# Patient Record
Sex: Female | Born: 1975 | Race: Black or African American | Hispanic: No | Marital: Married | Smoking: Current every day smoker
Health system: Southern US, Community
[De-identification: ages and names within clinical notes are randomized; demographics above are authoritative.]

## PROBLEM LIST (undated history)

## (undated) DIAGNOSIS — D649 Anemia, unspecified: Secondary | ICD-10-CM

## (undated) DIAGNOSIS — O139 Gestational [pregnancy-induced] hypertension without significant proteinuria, unspecified trimester: Secondary | ICD-10-CM

## (undated) DIAGNOSIS — F32A Depression, unspecified: Secondary | ICD-10-CM

## (undated) DIAGNOSIS — E119 Type 2 diabetes mellitus without complications: Secondary | ICD-10-CM

## (undated) HISTORY — PX: TONSILLECTOMY: SUR1361

---

## 2018-02-21 DIAGNOSIS — D508 Other iron deficiency anemias: Secondary | ICD-10-CM

## 2018-02-21 DIAGNOSIS — N920 Excessive and frequent menstruation with regular cycle: Secondary | ICD-10-CM

## 2018-11-05 ENCOUNTER — Inpatient Hospital Stay (HOSPITAL_COMMUNITY)
Admission: EM | Admit: 2018-11-05 | Discharge: 2018-11-10 | DRG: 254 | Disposition: A | Discharge status: Home / Self Care | Payer: BLUE CROSS/BLUE SHIELD | Attending: Vascular Surgery | Admitting: Vascular Surgery

## 2018-11-05 ENCOUNTER — Other Ambulatory Visit: Payer: Self-pay

## 2018-11-05 DIAGNOSIS — F17210 Nicotine dependence, cigarettes, uncomplicated: Secondary | ICD-10-CM | POA: Diagnosis not present

## 2018-11-05 DIAGNOSIS — I70292 Other atherosclerosis of native arteries of extremities, left leg: Secondary | ICD-10-CM | POA: Diagnosis not present

## 2018-11-05 DIAGNOSIS — I70202 Unspecified atherosclerosis of native arteries of extremities, left leg: Secondary | ICD-10-CM | POA: Diagnosis present

## 2018-11-05 NOTE — ED Provider Notes (Signed)
[facility] EMERGENCY DEPARTMENT Provider Note   CSN: [id number] Arrival date & time: 11/05/18  2248    History   Chief Complaint No chief complaint on file.   HPI Ronita Behner is a 43 y.o. female.     HPI  This is a 43 year old female every day smoker who presents as a transfer from an outside hospital with concern for left popliteal artery occlusion.  Patient presented to outside hospital with complaints of worsening left knee pain that migrated to her left lower leg.  Time she reports numbness.  She was found to have difficult to assess pedal pulses.  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  She was started on heparin.  She was given pain medication prior to transfer and currently denies any pain.  No history of atrial fibrillation or embolic events in the past.  She was transferred here for vascular surgery evaluation.  No past medical history on file.  There are no active problems to display for this patient.  No significant past medical history.  OB History   No obstetric history on file.      Home Medications    Prior to Admission medications   Not on File    Family History No family history on file.  Social History Social History   Tobacco Use  . Smoking status: Not on file  Substance Use Topics  . Alcohol use: Not on file  . Drug use: Not on file     Allergies   Patient has no known allergies.   Review of Systems Review of Systems  Constitutional: Negative for fever.  Respiratory: Negative for shortness of breath.   Cardiovascular: Negative for chest pain and leg swelling.  Musculoskeletal:       Left leg pain  Skin: Negative for color change.  Neurological: Positive for numbness. Negative for weakness.  All other systems reviewed and are negative.    Physical Exam Updated Vital Signs BP (!) 151/84 (BP Location: Right Arm)   Pulse 71   Temp 98.4 F (36.9 C)    Resp 14   Ht 1.549 m (5\' 1" )   Wt 81.6 kg   LMP 10/10/2018 (Approximate)   SpO2 100%   BMI 34.01 kg/m   Physical Exam Vitals signs and nursing note reviewed.  Constitutional:      Appearance: She is well-developed. She is not ill-appearing or toxic-appearing.  HENT:     Head: Normocephalic and atraumatic.  Neck:     Musculoskeletal: Neck supple.  Cardiovascular:     Rate and Rhythm: Normal rate and regular rhythm.     Comments: 1+ DP pulse right foot, faint pulse DP pulse left foot Pulmonary:     Effort: Pulmonary effort is normal. No respiratory distress.  Musculoskeletal:        General: No swelling or tenderness.  Skin:    General: Skin is warm and dry.     Comments: No color change, left foot slightly cooler than right  Neurological:     Mental Status: She is alert and oriented to person, place, and time.      ED Treatments / Results  Labs (all labs ordered are listed, but only abnormal results are displayed) Labs Reviewed - No data to display  EKG None  Radiology No results found.  Procedures Procedures (including critical care time)  Medications Ordered in ED Medications - No data to display   Initial Impression /  Assessment and Plan / ED Course  I have reviewed the triage vital signs and the nursing notes.  Pertinent labs & imaging results that were available during my care of the patient were reviewed by me and considered in my medical decision making (see chart for details).        Patient presents as a transfer from an outside hospital with a CT confirmed popliteal artery occlusion.  She is overall nontoxic and her vital signs are stable.  She is pain-free.  Her exam is very subtle with the exception of decreased pulse in the left foot and slightly cooler.  Discussed with Dr. Clark.  He will assess the patient.  Final Clinical Impressions(s) / ED DiaChestine Sporegnoses   Final diagnoses:  Popliteal artery occlusion, left [facility](HCC)    ED Discharge Orders     None       Wilkie AyeHorton, Mayer Maskerourtney F, MD 11/06/18 [phone]

## 2018-11-05 NOTE — ED Triage Notes (Signed)
Pt here from [facility] via [place] for L popliteal artery occlusion. Pedal pulses present in both feet, L weaker than right. Pt arrives with Heparin running 1000 U/hr.

## 2018-11-06 ENCOUNTER — Encounter (HOSPITAL_COMMUNITY)
Admission: EM | Disposition: A | Discharge status: Home / Self Care | Payer: Self-pay | Source: Home / Self Care | Attending: Vascular Surgery

## 2018-11-06 ENCOUNTER — Encounter (HOSPITAL_COMMUNITY): Payer: Self-pay

## 2018-11-06 DIAGNOSIS — I70292 Other atherosclerosis of native arteries of extremities, left leg: Secondary | ICD-10-CM | POA: Diagnosis present

## 2018-11-06 DIAGNOSIS — I70202 Unspecified atherosclerosis of native arteries of extremities, left leg: Secondary | ICD-10-CM | POA: Diagnosis present

## 2018-11-06 DIAGNOSIS — I361 Nonrheumatic tricuspid (valve) insufficiency: Secondary | ICD-10-CM | POA: Diagnosis not present

## 2018-11-06 DIAGNOSIS — F17210 Nicotine dependence, cigarettes, uncomplicated: Secondary | ICD-10-CM | POA: Diagnosis present

## 2018-11-06 DIAGNOSIS — I743 Embolism and thrombosis of arteries of the lower extremities: Secondary | ICD-10-CM | POA: Diagnosis not present

## 2018-11-06 DIAGNOSIS — I739 Peripheral vascular disease, unspecified: Secondary | ICD-10-CM | POA: Diagnosis not present

## 2018-11-06 HISTORY — PX: LOWER EXTREMITY ANGIOGRAPHY: CATH118251

## 2018-11-06 LAB — CBC
HCT: 31.7 % — ABNORMAL LOW (ref 36.0–46.0)
Hemoglobin: 9.3 g/dL — ABNORMAL LOW (ref 12.0–15.0)
MCH: 23.7 pg — ABNORMAL LOW (ref 26.0–34.0)
MCHC: 29.3 g/dL — ABNORMAL LOW (ref 30.0–36.0)
MCV: 80.9 fL (ref 80.0–100.0)
Platelets: 261 10*3/uL (ref 150–400)
RBC: 3.92 MIL/uL (ref 3.87–5.11)
RDW: 16.1 % — ABNORMAL HIGH (ref 11.5–15.5)
WBC: 6.8 10*3/uL (ref 4.0–10.5)
nRBC: 0 % (ref 0.0–0.2)

## 2018-11-06 LAB — COMPREHENSIVE METABOLIC PANEL
ALT: 24 U/L (ref 0–44)
AST: 23 U/L (ref 15–41)
Albumin: 3.7 g/dL (ref 3.5–5.0)
Alkaline Phosphatase: 87 U/L (ref 38–126)
Anion gap: 8 (ref 5–15)
BUN: 7 mg/dL (ref 6–20)
CO2: 24 mmol/L (ref 22–32)
Calcium: 8.8 mg/dL — ABNORMAL LOW (ref 8.9–10.3)
Chloride: 107 mmol/L (ref 98–111)
Creatinine, Ser: 0.81 mg/dL (ref 0.44–1.00)
GFR calc Af Amer: >60 mL/min (ref >60)
GFR calc non Af Amer: >60 mL/min (ref >60)
Glucose, Bld: 91 mg/dL (ref 70–99)
Potassium: 3.9 mmol/L (ref 3.5–5.1)
Sodium: 139 mmol/L (ref 135–145)
Total Bilirubin: 0.4 mg/dL (ref 0.3–1.2)
Total Protein: 7 g/dL (ref 6.5–8.1)

## 2018-11-06 LAB — PROTIME-INR
INR: 1 (ref 0.8–1.2)
Prothrombin Time: 13.5 seconds (ref 11.4–15.2)

## 2018-11-06 LAB — HEPARIN LEVEL (UNFRACTIONATED)
Heparin Unfractionated: 0.34 IU/mL (ref 0.30–0.70)
Heparin Unfractionated: 0.34 IU/mL (ref 0.30–0.70)

## 2018-11-06 SURGERY — LOWER EXTREMITY ANGIOGRAPHY
Anesthesia: LOCAL | Laterality: Bilateral

## 2018-11-06 MED ORDER — HEPARIN (PORCINE) IN NACL 1000-0.9 UT/500ML-% IV SOLN
INTRAVENOUS | Status: DC | PRN
  Administered 2018-11-06: 500 mL

## 2018-11-06 MED ORDER — CEFAZOLIN SODIUM-DEXTROSE 2-4 GM/100ML-% IV SOLN
2.0000 g | Freq: Once | INTRAVENOUS | Status: AC
  Administered 2018-11-07: 2 g via INTRAVENOUS

## 2018-11-06 MED ORDER — HEPARIN (PORCINE) 25000 UT/250ML-% IV SOLN
1000.0000 [IU]/h | INTRAVENOUS | Status: DC
  Administered 2018-11-06: 1000 [IU]/h via INTRAVENOUS
  Filled 2018-11-06: qty 250

## 2018-11-06 MED ORDER — DIPHENHYDRAMINE HCL 25 MG PO CAPS
50.0000 mg | ORAL_CAPSULE | Freq: Four times a day (QID) | ORAL | Status: DC | PRN
  Administered 2018-11-06 – 2018-11-08 (×3): 50 mg via ORAL
  Filled 2018-11-06 (×3): qty 2

## 2018-11-06 MED ORDER — HYDRALAZINE HCL 20 MG/ML IJ SOLN: 5.0000 mg | INTRAMUSCULAR | Status: DC | PRN

## 2018-11-06 MED ORDER — LIDOCAINE HCL (PF) 1 % IJ SOLN
INTRAMUSCULAR | Status: DC | PRN
  Administered 2018-11-06: 15 mL

## 2018-11-06 MED ORDER — FENTANYL CITRATE (PF) 100 MCG/2ML IJ SOLN
INTRAMUSCULAR | Status: AC
  Filled 2018-11-06: qty 2

## 2018-11-06 MED ORDER — MIDAZOLAM HCL 2 MG/2ML IJ SOLN
INTRAMUSCULAR | Status: AC
  Filled 2018-11-06: qty 2

## 2018-11-06 MED ORDER — MIDAZOLAM HCL 2 MG/2ML IJ SOLN
INTRAMUSCULAR | Status: DC | PRN
  Administered 2018-11-06: 1 mg via INTRAVENOUS

## 2018-11-06 MED ORDER — SODIUM CHLORIDE 0.9% FLUSH
3.0000 mL | Freq: Two times a day (BID) | INTRAVENOUS | Status: DC
  Administered 2018-11-07 – 2018-11-10 (×7): 3 mL via INTRAVENOUS

## 2018-11-06 MED ORDER — HEPARIN (PORCINE) IN NACL 1000-0.9 UT/500ML-% IV SOLN
INTRAVENOUS | Status: AC
  Filled 2018-11-06: qty 1000

## 2018-11-06 MED ORDER — MORPHINE SULFATE (PF) 2 MG/ML IV SOLN
2.0000 mg | INTRAVENOUS | Status: DC | PRN
  Administered 2018-11-07 (×3): 2 mg via INTRAVENOUS
  Filled 2018-11-06 (×3): qty 1

## 2018-11-06 MED ORDER — GUAIFENESIN-DM 100-10 MG/5ML PO SYRP: 15.0000 mL | ORAL_SOLUTION | ORAL | Status: DC | PRN

## 2018-11-06 MED ORDER — HEPARIN (PORCINE) 25000 UT/250ML-% IV SOLN: 1000.0000 [IU]/h | INTRAVENOUS | Status: DC

## 2018-11-06 MED ORDER — ONDANSETRON HCL 4 MG/2ML IJ SOLN: 4.0000 mg | Freq: Four times a day (QID) | INTRAMUSCULAR | Status: DC | PRN

## 2018-11-06 MED ORDER — SODIUM CHLORIDE 0.9 % WEIGHT BASED INFUSION
1.0000 mL/kg/h | INTRAVENOUS | Status: AC
  Administered 2018-11-06: 1 mL/kg/h via INTRAVENOUS

## 2018-11-06 MED ORDER — TRAMADOL HCL 50 MG PO TABS
50.0000 mg | ORAL_TABLET | Freq: Four times a day (QID) | ORAL | Status: DC | PRN
  Administered 2018-11-07 – 2018-11-09 (×5): 50 mg via ORAL
  Filled 2018-11-06 (×5): qty 1

## 2018-11-06 MED ORDER — ALUM & MAG HYDROXIDE-SIMETH 200-200-20 MG/5ML PO SUSP: 15.0000 mL | ORAL | Status: DC | PRN

## 2018-11-06 MED ORDER — METOPROLOL TARTRATE 5 MG/5ML IV SOLN: 2.0000 mg | INTRAVENOUS | Status: DC | PRN

## 2018-11-06 MED ORDER — POTASSIUM CHLORIDE CRYS ER 20 MEQ PO TBCR: 20.0000 meq | EXTENDED_RELEASE_TABLET | Freq: Once | ORAL | Status: DC

## 2018-11-06 MED ORDER — SODIUM CHLORIDE 0.9% FLUSH: 3.0000 mL | INTRAVENOUS | Status: DC | PRN

## 2018-11-06 MED ORDER — FENTANYL CITRATE (PF) 100 MCG/2ML IJ SOLN
INTRAMUSCULAR | Status: DC | PRN
  Administered 2018-11-06: 50 ug via INTRAVENOUS

## 2018-11-06 MED ORDER — SODIUM CHLORIDE 0.9 % IV SOLN: INTRAVENOUS | Status: DC

## 2018-11-06 MED ORDER — CEFAZOLIN SODIUM-DEXTROSE 1-4 GM/50ML-% IV SOLN: 1.0000 g | INTRAVENOUS | Status: DC

## 2018-11-06 MED ORDER — ACETAMINOPHEN 325 MG PO TABS
650.0000 mg | ORAL_TABLET | ORAL | Status: DC | PRN
  Administered 2018-11-07 – 2018-11-08 (×2): 650 mg via ORAL
  Filled 2018-11-06 (×2): qty 2

## 2018-11-06 MED ORDER — SODIUM CHLORIDE 0.9 % IV SOLN: 250.0000 mL | INTRAVENOUS | Status: DC | PRN

## 2018-11-06 MED ORDER — PHENOL 1.4 % MT LIQD: 1.0000 | OROMUCOSAL | Status: DC | PRN

## 2018-11-06 MED ORDER — LIDOCAINE HCL (PF) 1 % IJ SOLN
INTRAMUSCULAR | Status: AC
  Filled 2018-11-06: qty 30

## 2018-11-06 MED ORDER — LABETALOL HCL 5 MG/ML IV SOLN
10.0000 mg | INTRAVENOUS | Status: DC | PRN
  Administered 2018-11-07 (×2): 10 mg via INTRAVENOUS

## 2018-11-06 MED ORDER — LABETALOL HCL 5 MG/ML IV SOLN: 10.0000 mg | INTRAVENOUS | Status: DC | PRN

## 2018-11-06 MED ORDER — MORPHINE SULFATE (PF) 10 MG/ML IV SOLN
INTRAVENOUS | Status: AC
  Filled 2018-11-06: qty 1

## 2018-11-06 MED ORDER — MORPHINE SULFATE (PF) 10 MG/ML IV SOLN
INTRAVENOUS | Status: DC | PRN
  Administered 2018-11-06: 5 mg via INTRAVENOUS

## 2018-11-06 MED ORDER — IODIXANOL 320 MG/ML IV SOLN
INTRAVENOUS | Status: DC | PRN
  Administered 2018-11-06: 13:00:00 126 mL via INTRA_ARTERIAL

## 2018-11-06 MED ORDER — PANTOPRAZOLE SODIUM 40 MG PO TBEC
40.0000 mg | DELAYED_RELEASE_TABLET | Freq: Every day | ORAL | Status: DC
  Administered 2018-11-06 – 2018-11-10 (×5): 40 mg via ORAL
  Filled 2018-11-06 (×5): qty 1

## 2018-11-06 SURGICAL SUPPLY — 11 items
CATH OMNI FLUSH 5F 65CM (CATHETERS) ×2 IMPLANT
CLOSURE MYNX CONTROL 5F (Vascular Products) ×2 IMPLANT
GUIDEWIRE ANGLED .035X150CM (WIRE) ×2 IMPLANT
KIT MICROPUNCTURE NIT STIFF (SHEATH) ×2 IMPLANT
KIT PV (KITS) ×2 IMPLANT
SHEATH PINNACLE 5F 10CM (SHEATH) ×2 IMPLANT
SHEATH PROBE COVER 6X72 (BAG) ×2 IMPLANT
SYR MEDRAD MARK V 150ML (SYRINGE) ×2 IMPLANT
TRANSDUCER W/STOPCOCK (MISCELLANEOUS) ×2 IMPLANT
TRAY PV CATH (CUSTOM PROCEDURE TRAY) ×2 IMPLANT
WIRE BENTSON .035X145CM (WIRE) ×2 IMPLANT

## 2018-11-06 NOTE — Progress Notes (Signed)
ANTICOAGULATION CONSULT NOTE - Follow Up Consult  Pharmacy Consult for Heparin Indication: DVT  Allergies  Allergen Reactions  . Hydrocodone Bitartrate Er Hives    Patient Measurements: Height: 5\' 1"  (154.9 cm) Weight: 177 lb 12.8 oz (80.6 kg) IBW/kg (Calculated) : 47.8 Heparin Dosing Weight: 66kg  Vital Signs: Temp: 97.9 F (36.6 C) (04/14 0800) Temp Source: Oral (04/14 0800) BP: 131/76 (04/14 0800) Pulse Rate: 72 (04/14 0800)  Labs: Recent Labs    11/06/18 0402 11/06/18 0921  HGB 9.3*  --   HCT 31.7*  --   PLT 261  --   LABPROT 13.5  --   INR 1.0  --   HEPARINUNFRC 0.34 0.34  CREATININE 0.81  --     Estimated Creatinine Clearance: 88.9 mL/min (by C-G formula based on SCr of 0.81 mg/dL).   Assessment:  Anticoag: none PTA - DVT: transferred from [facility] w/ heparin IV at 1000 units/hr. HL 0.34 x 2 in goal. Hgb only 9.3. Plts 261   Goal of Therapy:  Heparin level 0.3-0.7 units/ml Monitor platelets by anticoagulation protocol: Yes   Plan:  Continue heparin 1000 units/hr Daily HL and CBC Fu vascular plans  Lahela Woodin S. Merilynn Finland, PharmD, BCPS Clinical Staff Pharmacist Misty Stanley Stillinger 11/06/2018,10:58 AM

## 2018-11-06 NOTE — Progress Notes (Addendum)
ANTICOAGULATION CONSULT NOTE - Initial Consult  Pharmacy Consult for heparin Indication: DVT  No Known Allergies  Patient Measurements: Height: 5\' 1"  (154.9 cm) Weight: 180 lb (81.6 kg) IBW/kg (Calculated) : 47.8 Heparin Dosing Weight: 66.3 kg  Vital Signs: Temp: 98.4 F (36.9 C) (04/13 2253) BP: 151/84 (04/13 2253) Pulse Rate: 71 (04/13 2253)  Labs: No results for input(s): HGB, HCT, PLT, APTT, LABPROT, INR, HEPARINUNFRC, HEPRLOWMOCWT, CREATININE, CKTOTAL, CKMB, TROPONINI in the last 72 hours.  CrCl cannot be calculated (No successful lab value found.).   Medical History: No past medical history on file.  Medications:  Scheduled:  . pantoprazole  40 mg Oral Daily  . potassium chloride  20-40 mEq Oral Once   Infusions:  . sodium chloride      Assessment: 43 yo F admitted with DVT (left popliteal vein occlusion) and was transferred from OSH with IV heparin infusion. Not on any anticoagulants PTA.  Per [facility] records: heparin IV 4000 units x 1 given @ 2127 followed by heparin infusion at 1000 units/hr started @ 2136.    Goal of Therapy:  Heparin level 0.3-0.7 units/ml Monitor platelets by anticoagulation protocol: Yes   Plan:  Continue heparin drip at 1000 units/hr Check heparin level now Continue to monitor CBC and signs/symptoms of bleeding  Danae Orleans, PharmD PGY1 Pharmacy Resident Phone [phone] 11/06/2018       2:02 AM  ------------------------------------------------------------------------------------------------------------------------ Addendum: Heparin level therapeutic at 0.34 on heparin IV 1000 units/hr  Plan:  Continue heparin drip at 1000 units/hr Check confirmatory heparin level in 6 hours Monitor CBC and signs/symptoms of bleeding  Danae Orleans, PharmD PGY1 Pharmacy Resident Phone [phone] 11/06/2018       4:42 AM

## 2018-11-06 NOTE — Consult Note (Signed)
Hospital Consult    Reason for Consult:  Left popliteal artery occlusion Referring Physician:  [facility] Salvia MRN #:  [id number]  History of Present Illness: This is a 43 y.o. female with history of chronic tobacco abuse that presents as a transfer from [place] for evaluation of left popliteal artery occlusion.  Patient was seen in the ED there complaining of left knee pain with pain that radiated down to her calf and although Doppler signals were identified a CTA of her left lower extremity was ordered.  CTA showed a occlusion of her left popliteal artery with occlusion extending into the tibioperoneal trunk and proximal posterior tibial artery. Patient endorses several years of pain in her left knee that comes and goes without warning.  She states at times she has pain in her left calf when she walks that usually alleviates with rest.  She went to the ED tonight due to the severity of the pain, states it felt the same as all the other episodes.  She has been seen in the ED multiple times for left knee pain with her last visit in January of this year and she states her pain today is the same character as the pain she had in January.  She denies any previous history of thromboembolism or other blood clots.  She does smoke.  She is not on blood thinners.  PMH: Tobacco abuse  PSH: Denies  Social hx: Tobacco abuse since age 73  No Known Allergies  Prior to Admission medications   Not on File    No family history on file.  ROS: [x]  Positive   [ ]  Negative   [ ]  All sytems reviewed and are negative  Cardiovascular: []  chest pain/pressure []  palpitations []  SOB lying flat []  DOE [x]  pain in legs while walking (left leg sometimes) []  pain in legs at rest []  pain in legs at night []  non-healing ulcers []  hx of DVT []  swelling in legs  Pulmonary: []  productive cough []  asthma/wheezing []  home O2  Neurologic: []  weakness in []  arms []  legs []  numbness in []  arms []  legs []  hx of CVA  []  mini stroke [] difficulty speaking or slurred speech []  temporary loss of vision in one eye []  dizziness  Hematologic: []  hx of cancer []  bleeding problems []  problems with blood clotting easily  Endocrine:   []  diabetes []  thyroid disease  GI []  vomiting blood []  blood in stool  GU: []  CKD/renal failure []  HD--[]  M/W/F or []  T/T/S []  burning with urination []  blood in urine  Psychiatric: []  anxiety []  depression  Musculoskeletal: []  arthritis []  joint pain  Integumentary: []  rashes []  ulcers  Constitutional: []  fever []  chills   Physical Examination  Vitals:   11/05/18 2253  BP: (!) 151/84  Pulse: 71  Resp: 14  Temp: 98.4 F (36.9 C)  SpO2: 100%   Body mass index is 34.01 kg/m.  General:  WDWN in NAD Gait: Not observed HENT: WNL, normocephalic Pulmonary: normal non-labored breathing, without Rales, rhonchi,  wheezing Cardiac: regular, without  Murmurs, rubs or gallops Abdomen: soft, NT/ND, no masses Skin: without rashes Vascular Exam/Pulses:  Right Left  Radial 2+ (normal) 2+ (normal)  Ulnar    Femoral 2+ (normal) 2+ (normal)  Popliteal 2+ (normal) absent  DP 2+ (normal) Signal  PT 2+ (normal) Signal   Extremities: without ischemic changes, without Gangrene , without cellulitis; without open wounds;  Musculoskeletal: no muscle wasting or atrophy  Neurologic: A&O X 3; Appropriate Affect ; SENSATION:  normal; MOTOR FUNCTION:  moving all extremities equally. Speech is fluent/normal Left foot is motor and sensory intact  CBC No results found for: WBC, RBC, HGB, HCT, PLT, MCV, MCH, MCHC, RDW, LYMPHSABS, MONOABS, EOSABS, BASOSABS  BMET No results found for: NA, K, CL, CO2, GLUCOSE, BUN, CREATININE, CALCIUM, GFRNONAA, GFRAA  COAGS: No results found for: INR, PROTIME   Non-Invasive Vascular Imaging:    I reviewed her CTA of the left leg that shows occlusion of her left popliteal artery with extension of thrombus into the tibioperoneal  trunk trunk and proximal posterior tibial artery.  There is distal reconstitution of all 3 tibial vessels.   ASSESSMENT/PLAN: This is a 42 y.o. female that presents as a transfer from [place] for evaluation of left popliteal occlusion.  Her exam is relatively reassuring and she does not appear overtly ischemic.  On my evaluation, she was having no pain in her left lower extremity, she has dorsalis pedis and posterior tibial signals at the ankle, and she is motor and sensory intact.  She describes several years of left knee pain and discomfort that radiates up and down her leg.  At times she has what sounds like claudication when she walks - pain in the calf that alleviates with rest.  She has been seen in multiple ED's over the last 3 months for evaluation of this left knee pain.  Her history would suggest that this is potentially more of a chronic problem.  Certainly a popliteal cutdown with attempt at thromboembolectomy would be an option, but if chronic the clot may not be amendable to thrombectomy.  She may benefit from arteriogram first in case we cannot retrieve clot and would require a bypass.  We will plan to admit her tonight and keep her on heparin at this time.  We will keep her n.p.o. I discussed all of these options with her in detail.     Rocio Wolak J. Martika Egler, MD [facility] [place] Office: [phone] Pager: [phone] 

## 2018-11-06 NOTE — Progress Notes (Signed)
I discussed details of angio with the patient today/  We discussed proceeding with operative intervention tomorrow.  We talked about thrombectomy vs bypass.  We discussed the details as well as risks of the surgery.  I will go over them with her again tomorrow.  NPO after midnitght  Debra Ball

## 2018-11-06 NOTE — H&P (View-Only) (Signed)
I discussed details of angio with the patient today/  We discussed proceeding with operative intervention tomorrow.  We talked about thrombectomy vs bypass.  We discussed the details as well as risks of the surgery.  I will go over them with her again tomorrow.  NPO after midnitght  Debra Ball 

## 2018-11-06 NOTE — H&P (View-Only) (Signed)
Hospital Consult    Reason for Consult:  Left popliteal artery occlusion Referring Physician:  [facility] Sal[facility] MRN #:  [id number]  History of Present Illness: This is a 43 y.o. female with history of chronic tobacco abuse that presents as a transfer from [place] for evaluation of left popliteal artery occlusion.  Patient was seen in the ED there complaining of left knee pain with pain that radiated down to her calf and although Doppler signals were identified a CTA of her left lower extremity was ordered.  CTA showed a occlusion of her left popliteal artery with occlusion extending into the tibioperoneal trunk and proximal posterior tibial artery. Patient endorses several years of pain in her left knee that comes and goes without warning.  She states at times she has pain in her left calf when she walks that usually alleviates with rest.  She went to the ED tonight due to the severity of the pain, states it felt the same as all the other episodes.  She has been seen in the ED multiple times for left knee pain with her last visit in January of this year and she states her pain today is the same character as the pain she had in January.  She denies any previous history of thromboembolism or other blood clots.  She does smoke.  She is not on blood thinners.  PMH: Tobacco abuse  PSH: Denies  Social hx: Tobacco abuse since age 73  No Known Allergies  Prior to Admission medications   Not on File    No family history on file.  ROS: [x]  Positive   [ ]  Negative   [ ]  All sytems reviewed and are negative  Cardiovascular: []  chest pain/pressure []  palpitations []  SOB lying flat []  DOE [x]  pain in legs while walking (left leg sometimes) []  pain in legs at rest []  pain in legs at night []  non-healing ulcers []  hx of DVT []  swelling in legs  Pulmonary: []  productive cough []  asthma/wheezing []  home O2  Neurologic: []  weakness in []  arms []  legs []  numbness in []  arms []  legs []  hx of CVA  []  mini stroke [] difficulty speaking or slurred speech []  temporary loss of vision in one eye []  dizziness  Hematologic: []  hx of cancer []  bleeding problems []  problems with blood clotting easily  Endocrine:   []  diabetes []  thyroid disease  GI []  vomiting blood []  blood in stool  GU: []  CKD/renal failure []  HD--[]  M/W/F or []  T/T/S []  burning with urination []  blood in urine  Psychiatric: []  anxiety []  depression  Musculoskeletal: []  arthritis []  joint pain  Integumentary: []  rashes []  ulcers  Constitutional: []  fever []  chills   Physical Examination  Vitals:   11/05/18 2253  BP: (!) 151/84  Pulse: 71  Resp: 14  Temp: 98.4 F (36.9 C)  SpO2: 100%   Body mass index is 34.01 kg/m.  General:  WDWN in NAD Gait: Not observed HENT: WNL, normocephalic Pulmonary: normal non-labored breathing, without Rales, rhonchi,  wheezing Cardiac: regular, without  Murmurs, rubs or gallops Abdomen: soft, NT/ND, no masses Skin: without rashes Vascular Exam/Pulses:  Right Left  Radial 2+ (normal) 2+ (normal)  Ulnar    Femoral 2+ (normal) 2+ (normal)  Popliteal 2+ (normal) absent  DP 2+ (normal) Signal  PT 2+ (normal) Signal   Extremities: without ischemic changes, without Gangrene , without cellulitis; without open wounds;  Musculoskeletal: no muscle wasting or atrophy  Neurologic: A&O X 3; Appropriate Affect ; SENSATION:  normal; MOTOR FUNCTION:  moving all extremities equally. Speech is fluent/normal Left foot is motor and sensory intact  CBC No results found for: WBC, RBC, HGB, HCT, PLT, MCV, MCH, MCHC, RDW, LYMPHSABS, MONOABS, EOSABS, BASOSABS  BMET No results found for: NA, K, CL, CO2, GLUCOSE, BUN, CREATININE, CALCIUM, GFRNONAA, GFRAA  COAGS: No results found for: INR, PROTIME   Non-Invasive Vascular Imaging:    I reviewed her CTA of the left leg that shows occlusion of her left popliteal artery with extension of thrombus into the tibioperoneal  trunk trunk and proximal posterior tibial artery.  There is distal reconstitution of all 3 tibial vessels.   ASSESSMENT/PLAN: This is a 43 y.o. female that presents as a transfer from [place] for evaluation of left popliteal occlusion.  Her exam is relatively reassuring and she does not appear overtly ischemic.  On my evaluation, she was having no pain in her left lower extremity, she has dorsalis pedis and posterior tibial signals at the ankle, and she is motor and sensory intact.  She describes several years of left knee pain and discomfort that radiates up and down her leg.  At times she has what sounds like claudication when she walks - pain in the calf that alleviates with rest.  She has been seen in multiple ED's over the last 3 months for evaluation of this left knee pain.  Her history would suggest that this is potentially more of a chronic problem.  Certainly a popliteal cutdown with attempt at thromboembolectomy would be an option, but if chronic the clot may not be amendable to thrombectomy.  She may benefit from arteriogram first in case we cannot retrieve clot and would require a bypass.  We will plan to admit her tonight and keep her on heparin at this time.  We will keep her n.p.o. I discussed all of these options with her in detail.     Cephus Shellinghristopher J. Clark, MD [facility] [place] Office: [phone] Pager: [phone]

## 2018-11-06 NOTE — Interval H&P Note (Signed)
History and Physical Interval Note:  11/06/2018 12:12 PM  Debra Ball  has presented today for surgery, with the diagnosis of pad.  The various methods of treatment have been discussed with the patient and family. After consideration of risks, benefits and other options for treatment, the patient has consented to  Procedure(s): LOWER EXTREMITY ANGIOGRAPHY (N/A) as a surgical intervention.  The patient's history has been reviewed, patient examined, no change in status, stable for surgery.  I have reviewed the patient's chart and labs.  Questions were answered to the patient's satisfaction.     Durene Cal

## 2018-11-06 NOTE — ED Notes (Signed)
Consulting provider bedside

## 2018-11-06 NOTE — Op Note (Signed)
    Patient name: Kerrian Panik MRN: [id number] DOB: [birth year] Sex: female  11/05/2018 - 11/06/2018 Pre-operative Diagnosis: Left popliteal occlusion Post-operative diagnosis:  Same Surgeon:  Durene Cal Procedure Performed:  1.  Ultrasound-guided access, right femoral artery  2.  Abdominal aortogram  3.  Bilateral lower extremity runoff  4.  Second-order catheterization  5.  Conscious sedation (15 minutes)  6.  Closure device (Mynx)     Indications: Patient has had left leg pain for several months.  CT scan shows popliteal occlusion.  She is here today for further evaluation  Procedure:  The patient was identified in the holding area and taken to room 8.  The patient was then placed supine on the table and prepped and draped in the usual sterile fashion.  A time out was called.  Conscious sedation was administered with the use of IV fentanyl and Versed under continuous physician and nurse monitoring.  Heart rate, blood pressure, and oxygen saturation were continuously monitored.  Total sedation time was .  Ultrasound was used to evaluate the right common femoral artery.  It was patent .  A digital ultrasound image was acquired.  A micropuncture needle was used to access the right common femoral artery under ultrasound guidance.  An 018 wire was advanced without resistance and a micropuncture sheath was placed.  The 018 wire was removed and a benson wire was placed.  The micropuncture sheath was exchanged for a 5 french sheath.  An omniflush catheter was advanced over the wire to the level of L-1.  An abdominal angiogram was obtained.  Next, using the omniflush catheter and a benson wire, the aortic bifurcation was crossed and the catheter was placed into theleft external iliac artery and left runoff was obtained.  right runoff was performed via retrograde sheath injections.  Findings:   Aortogram: No significant renal artery stenosis is identified.  The infrarenal abdominal  aorta is widely patent.  Bilateral common and external leg arteries widely patent.  Right Lower Extremity: Right common femoral profundofemoral and superficial femoral artery are widely patent.  Popliteal arteries widely patent.  There is a high origin to the anterior tibial artery.  The peroneal artery is very diminutive.  The posterior tibial and anterior tibial artery both across the ankle.  Left Lower Extremity: The left common femoral profundofemoral and superficial femoral artery are widely patent.  The popliteal artery is patent down to the joint space where it occludes there is occlusion of all 3 tibial vessels at their origin.  The first vessel to return is the anterior tibial which is the dominant vessel across the ankle.  The peroneal artery terminates in the mid calf.  Posterior tibial artery reconstitutes but then re-occludes in the distal calf.  Intervention: None  Impression:  #1  Left popliteal occlusion.  The anterior tibial artery reconstitutes at its origin and is the dominant vessel across the ankle.  The posterior tibial artery reconstitutes just beyond its origin but then re-occludes however there is filling of the posterior tibial artery across the ankle.   Juleen China, M.D., [facility] [place] Office: [phone] Pager:  [phone]

## 2018-11-07 ENCOUNTER — Encounter (HOSPITAL_COMMUNITY): Payer: Self-pay | Admitting: Surgery

## 2018-11-07 ENCOUNTER — Encounter (HOSPITAL_COMMUNITY)
Admission: EM | Disposition: A | Discharge status: Home / Self Care | Payer: Self-pay | Source: Home / Self Care | Attending: Vascular Surgery

## 2018-11-07 ENCOUNTER — Inpatient Hospital Stay (HOSPITAL_COMMUNITY): Payer: BLUE CROSS/BLUE SHIELD | Admitting: Anesthesiology

## 2018-11-07 DIAGNOSIS — I70202 Unspecified atherosclerosis of native arteries of extremities, left leg: Secondary | ICD-10-CM

## 2018-11-07 HISTORY — PX: FEMORAL-TIBIAL BYPASS GRAFT: SHX938

## 2018-11-07 LAB — CBC
HCT: 30.3 % — ABNORMAL LOW (ref 36.0–46.0)
Hemoglobin: 8.3 g/dL — ABNORMAL LOW (ref 12.0–15.0)
MCH: 22.6 pg — ABNORMAL LOW (ref 26.0–34.0)
MCHC: 27.4 g/dL — ABNORMAL LOW (ref 30.0–36.0)
MCV: 82.3 fL (ref 80.0–100.0)
Platelets: 248 10*3/uL (ref 150–400)
RBC: 3.68 MIL/uL — ABNORMAL LOW (ref 3.87–5.11)
RDW: 16.1 % — ABNORMAL HIGH (ref 11.5–15.5)
WBC: 5.6 10*3/uL (ref 4.0–10.5)
nRBC: 0 % (ref 0.0–0.2)

## 2018-11-07 LAB — POCT I-STAT 4, (NA,K, GLUC, HGB,HCT)
Glucose, Bld: 87 mg/dL (ref 70–99)
HCT: 19 % — ABNORMAL LOW (ref 36.0–46.0)
Hemoglobin: 6.5 g/dL — CL (ref 12.0–15.0)
Potassium: 3.7 mmol/L (ref 3.5–5.1)
Sodium: 140 mmol/L (ref 135–145)

## 2018-11-07 LAB — BASIC METABOLIC PANEL
Anion gap: 10 (ref 5–15)
BUN: 8 mg/dL (ref 6–20)
CO2: 23 mmol/L (ref 22–32)
Calcium: 8.6 mg/dL — ABNORMAL LOW (ref 8.9–10.3)
Chloride: 106 mmol/L (ref 98–111)
Creatinine, Ser: 0.83 mg/dL (ref 0.44–1.00)
GFR calc Af Amer: >60 mL/min (ref >60)
GFR calc non Af Amer: >60 mL/min (ref >60)
Glucose, Bld: 111 mg/dL — ABNORMAL HIGH (ref 70–99)
Potassium: 4.1 mmol/L (ref 3.5–5.1)
Sodium: 139 mmol/L (ref 135–145)

## 2018-11-07 LAB — POCT ACTIVATED CLOTTING TIME: Activated Clotting Time: 219 seconds

## 2018-11-07 LAB — POCT PREGNANCY, URINE: Preg Test, Ur: NEGATIVE

## 2018-11-07 LAB — SURGICAL PCR SCREEN
MRSA, PCR: NEGATIVE
Staphylococcus aureus: NEGATIVE

## 2018-11-07 LAB — ABO/RH: ABO/RH(D): B POS

## 2018-11-07 LAB — HCG, SERUM, QUALITATIVE: Preg, Serum: NEGATIVE

## 2018-11-07 LAB — PREPARE RBC (CROSSMATCH)

## 2018-11-07 LAB — HEPARIN LEVEL (UNFRACTIONATED): Heparin Unfractionated: 0.37 IU/mL (ref 0.30–0.70)

## 2018-11-07 SURGERY — CREATION, BYPASS, ARTERIAL, FEMORAL TO TIBIAL, USING GRAFT
Anesthesia: General | Site: Leg Lower | Laterality: Left

## 2018-11-07 MED ORDER — 0.9 % SODIUM CHLORIDE (POUR BTL) OPTIME
TOPICAL | Status: DC | PRN
  Administered 2018-11-07: 14:00:00 2000 mL

## 2018-11-07 MED ORDER — ROCURONIUM BROMIDE 50 MG/5ML IV SOSY
PREFILLED_SYRINGE | INTRAVENOUS | Status: AC
  Filled 2018-11-07: qty 10

## 2018-11-07 MED ORDER — LIDOCAINE 2% (20 MG/ML) 5 ML SYRINGE
INTRAMUSCULAR | Status: DC | PRN
  Administered 2018-11-07: 60 mg via INTRAVENOUS

## 2018-11-07 MED ORDER — HEPARIN (PORCINE) 25000 UT/250ML-% IV SOLN
1000.0000 [IU]/h | INTRAVENOUS | Status: DC
  Administered 2018-11-07: 800 [IU]/h via INTRAVENOUS
  Administered 2018-11-09: 1000 [IU]/h via INTRAVENOUS
  Filled 2018-11-07 (×2): qty 250

## 2018-11-07 MED ORDER — LACTATED RINGERS IV SOLN
INTRAVENOUS | Status: DC | PRN
  Administered 2018-11-07: 13:00:00 via INTRAVENOUS

## 2018-11-07 MED ORDER — LIDOCAINE 2% (20 MG/ML) 5 ML SYRINGE
INTRAMUSCULAR | Status: AC
  Filled 2018-11-07: qty 10

## 2018-11-07 MED ORDER — SODIUM CHLORIDE 0.9% IV SOLUTION: Freq: Once | INTRAVENOUS | Status: DC

## 2018-11-07 MED ORDER — PHENYLEPHRINE 40 MCG/ML (10ML) SYRINGE FOR IV PUSH (FOR BLOOD PRESSURE SUPPORT)
PREFILLED_SYRINGE | INTRAVENOUS | Status: AC
  Filled 2018-11-07: qty 10

## 2018-11-07 MED ORDER — SODIUM CHLORIDE 0.9 % IV SOLN
INTRAVENOUS | Status: AC
  Filled 2018-11-07: qty 1.2

## 2018-11-07 MED ORDER — HEPARIN SODIUM (PORCINE) 1000 UNIT/ML IJ SOLN
INTRAMUSCULAR | Status: DC | PRN
  Administered 2018-11-07: 8000 [IU] via INTRAVENOUS

## 2018-11-07 MED ORDER — ONDANSETRON HCL 4 MG/2ML IJ SOLN
INTRAMUSCULAR | Status: AC
  Filled 2018-11-07: qty 2

## 2018-11-07 MED ORDER — FENTANYL CITRATE (PF) 100 MCG/2ML IJ SOLN
INTRAMUSCULAR | Status: DC | PRN
  Administered 2018-11-07 (×2): 50 ug via INTRAVENOUS
  Administered 2018-11-07: 100 ug via INTRAVENOUS
  Administered 2018-11-07: 50 ug via INTRAVENOUS

## 2018-11-07 MED ORDER — DEXAMETHASONE SODIUM PHOSPHATE 10 MG/ML IJ SOLN
INTRAMUSCULAR | Status: AC
  Filled 2018-11-07: qty 1

## 2018-11-07 MED ORDER — MIDAZOLAM HCL 5 MG/5ML IJ SOLN
INTRAMUSCULAR | Status: DC | PRN
  Administered 2018-11-07: 2 mg via INTRAVENOUS

## 2018-11-07 MED ORDER — SUCCINYLCHOLINE CHLORIDE 200 MG/10ML IV SOSY
PREFILLED_SYRINGE | INTRAVENOUS | Status: AC
  Filled 2018-11-07: qty 10

## 2018-11-07 MED ORDER — FENTANYL CITRATE (PF) 100 MCG/2ML IJ SOLN
INTRAMUSCULAR | Status: AC
  Filled 2018-11-07: qty 2

## 2018-11-07 MED ORDER — LABETALOL HCL 5 MG/ML IV SOLN
INTRAVENOUS | Status: AC
  Filled 2018-11-07: qty 4

## 2018-11-07 MED ORDER — CEFAZOLIN SODIUM 1 G IJ SOLR
INTRAMUSCULAR | Status: AC
  Filled 2018-11-07: qty 20

## 2018-11-07 MED ORDER — MIDAZOLAM HCL 2 MG/2ML IJ SOLN
INTRAMUSCULAR | Status: AC
  Filled 2018-11-07: qty 2

## 2018-11-07 MED ORDER — PROPOFOL 10 MG/ML IV BOLUS
INTRAVENOUS | Status: DC | PRN
  Administered 2018-11-07: 200 mg via INTRAVENOUS

## 2018-11-07 MED ORDER — SUCCINYLCHOLINE CHLORIDE 20 MG/ML IJ SOLN
INTRAMUSCULAR | Status: DC | PRN
  Administered 2018-11-07: 120 mg via INTRAVENOUS

## 2018-11-07 MED ORDER — PAPAVERINE HCL 30 MG/ML IJ SOLN
INTRAMUSCULAR | Status: AC
  Filled 2018-11-07: qty 2

## 2018-11-07 MED ORDER — ONDANSETRON HCL 4 MG/2ML IJ SOLN
INTRAMUSCULAR | Status: DC | PRN
  Administered 2018-11-07: 4 mg via INTRAVENOUS

## 2018-11-07 MED ORDER — FENTANYL CITRATE (PF) 250 MCG/5ML IJ SOLN
INTRAMUSCULAR | Status: AC
  Filled 2018-11-07: qty 5

## 2018-11-07 MED ORDER — FENTANYL CITRATE (PF) 100 MCG/2ML IJ SOLN
25.0000 ug | INTRAMUSCULAR | Status: DC | PRN
  Administered 2018-11-07 (×2): 50 ug via INTRAVENOUS

## 2018-11-07 MED ORDER — ROCURONIUM BROMIDE 50 MG/5ML IV SOSY
PREFILLED_SYRINGE | INTRAVENOUS | Status: DC | PRN
  Administered 2018-11-07: 50 mg via INTRAVENOUS
  Administered 2018-11-07: 10 mg via INTRAVENOUS

## 2018-11-07 MED ORDER — PROPOFOL 10 MG/ML IV BOLUS
INTRAVENOUS | Status: AC
  Filled 2018-11-07: qty 20

## 2018-11-07 MED ORDER — SUGAMMADEX SODIUM 200 MG/2ML IV SOLN
INTRAVENOUS | Status: DC | PRN
  Administered 2018-11-07: 200 mg via INTRAVENOUS

## 2018-11-07 MED ORDER — HEMOSTATIC AGENTS (NO CHARGE) OPTIME
TOPICAL | Status: DC | PRN
  Administered 2018-11-07: 1 via TOPICAL

## 2018-11-07 MED ORDER — DEXAMETHASONE SODIUM PHOSPHATE 10 MG/ML IJ SOLN
INTRAMUSCULAR | Status: DC | PRN
  Administered 2018-11-07: 10 mg via INTRAVENOUS

## 2018-11-07 MED ORDER — SODIUM CHLORIDE 0.9 % IV SOLN
INTRAVENOUS | Status: DC | PRN
  Administered 2018-11-07: 500 mL

## 2018-11-07 MED ORDER — IODIXANOL 320 MG/ML IV SOLN
INTRAVENOUS | Status: DC | PRN
  Administered 2018-11-07: 15 mL via INTRAVENOUS

## 2018-11-07 MED ORDER — PROMETHAZINE HCL 25 MG/ML IJ SOLN: 6.2500 mg | INTRAMUSCULAR | Status: DC | PRN

## 2018-11-07 SURGICAL SUPPLY — 60 items
BAG BANDED W/RUBBER/TAPE 36X54 (MISCELLANEOUS) ×2 IMPLANT
BANDAGE ESMARK 6X9 LF (GAUZE/BANDAGES/DRESSINGS) IMPLANT
BNDG ESMARK 6X9 LF (GAUZE/BANDAGES/DRESSINGS)
CANISTER SUCT 3000ML PPV (MISCELLANEOUS) ×3 IMPLANT
CANNULA VESSEL 3MM 2 BLNT TIP (CANNULA) ×3 IMPLANT
CATH EMB 3FR 80CM (CATHETERS) ×2 IMPLANT
CLIP VESOCCLUDE MED 24/CT (CLIP) ×3 IMPLANT
CLIP VESOCCLUDE SM WIDE 24/CT (CLIP) ×3 IMPLANT
CONT SPEC 4OZ CLIKSEAL STRL BL (MISCELLANEOUS) ×2 IMPLANT
COVER DOME SNAP 22 D (MISCELLANEOUS) ×2 IMPLANT
COVER PROBE W GEL 5X96 (DRAPES) ×2 IMPLANT
COVER WAND RF STERILE (DRAPES) ×3 IMPLANT
CUFF TOURNIQUET SINGLE 24IN (TOURNIQUET CUFF) IMPLANT
CUFF TOURNIQUET SINGLE 34IN LL (TOURNIQUET CUFF) IMPLANT
CUFF TOURNIQUET SINGLE 44IN (TOURNIQUET CUFF) IMPLANT
DERMABOND ADVANCED (GAUZE/BANDAGES/DRESSINGS) ×2
DERMABOND ADVANCED .7 DNX12 (GAUZE/BANDAGES/DRESSINGS) ×1 IMPLANT
DRAIN CHANNEL 15F RND FF W/TCR (WOUND CARE) IMPLANT
DRAPE HALF SHEET 40X57 (DRAPES) ×4 IMPLANT
DRAPE X-RAY CASS 24X20 (DRAPES) IMPLANT
ELECT REM PT RETURN 9FT ADLT (ELECTROSURGICAL) ×3
ELECTRODE REM PT RTRN 9FT ADLT (ELECTROSURGICAL) ×1 IMPLANT
EVACUATOR SILICONE 100CC (DRAIN) IMPLANT
GLOVE BIOGEL PI IND STRL 7.5 (GLOVE) ×1 IMPLANT
GLOVE BIOGEL PI INDICATOR 7.5 (GLOVE) ×2
GLOVE SURG SS PI 7.5 STRL IVOR (GLOVE) ×3 IMPLANT
GOWN STRL NON-REIN LRG LVL3 (GOWN DISPOSABLE) ×2 IMPLANT
GOWN STRL REUS W/ TWL LRG LVL3 (GOWN DISPOSABLE) ×2 IMPLANT
GOWN STRL REUS W/ TWL XL LVL3 (GOWN DISPOSABLE) ×1 IMPLANT
GOWN STRL REUS W/TWL LRG LVL3 (GOWN DISPOSABLE) ×4
GOWN STRL REUS W/TWL XL LVL3 (GOWN DISPOSABLE) ×4
HEMOSTAT SNOW SURGICEL 2X4 (HEMOSTASIS) IMPLANT
KIT BASIN OR (CUSTOM PROCEDURE TRAY) ×3 IMPLANT
KIT TURNOVER KIT B (KITS) ×3 IMPLANT
MARKER GRAFT CORONARY BYPASS (MISCELLANEOUS) IMPLANT
NS IRRIG 1000ML POUR BTL (IV SOLUTION) ×6 IMPLANT
PACK PERIPHERAL VASCULAR (CUSTOM PROCEDURE TRAY) ×3 IMPLANT
PAD ARMBOARD 7.5X6 YLW CONV (MISCELLANEOUS) ×6 IMPLANT
SET COLLECT BLD 21X3/4 12 (NEEDLE) IMPLANT
SET COLLECT BLD 21X3/4 12 PB (MISCELLANEOUS) ×2 IMPLANT
STOPCOCK 4 WAY LG BORE MALE ST (IV SETS) ×2 IMPLANT
SUT ETHILON 3 0 PS 1 (SUTURE) IMPLANT
SUT PROLENE 5 0 C 1 24 (SUTURE) ×3 IMPLANT
SUT PROLENE 6 0 BV (SUTURE) ×3 IMPLANT
SUT PROLENE 7 0 BV 1 (SUTURE) IMPLANT
SUT SILK 2 0 SH (SUTURE) ×3 IMPLANT
SUT SILK 3 0 (SUTURE)
SUT SILK 3-0 18XBRD TIE 12 (SUTURE) IMPLANT
SUT VIC AB 2-0 CT1 27 (SUTURE) ×4
SUT VIC AB 2-0 CT1 TAPERPNT 27 (SUTURE) ×2 IMPLANT
SUT VIC AB 3-0 SH 27 (SUTURE) ×4
SUT VIC AB 3-0 SH 27X BRD (SUTURE) ×2 IMPLANT
SUT VICRYL 4-0 PS2 18IN ABS (SUTURE) ×6 IMPLANT
SYR 3ML LL SCALE MARK (SYRINGE) ×2 IMPLANT
TAPE UMBILICAL COTTON 1/8X30 (MISCELLANEOUS) IMPLANT
TOWEL GREEN STERILE (TOWEL DISPOSABLE) ×3 IMPLANT
TRAY FOLEY MTR SLVR 16FR STAT (SET/KITS/TRAYS/PACK) ×3 IMPLANT
TUBING EXTENTION W/L.L. (IV SETS) ×2 IMPLANT
UNDERPAD 30X30 (UNDERPADS AND DIAPERS) ×3 IMPLANT
WATER STERILE IRR 1000ML POUR (IV SOLUTION) ×3 IMPLANT

## 2018-11-07 NOTE — Transfer of Care (Addendum)
Immediate Anesthesia Transfer of Care Note  Patient: Debra Ball  Procedure(s) Performed: THROMBECTOMY POPLITEAL, ANTERIOR TIBIAL ARTERY, POSTERIOR TIBIAL ARTERY, AND VEIN PATCH ANTERIOR ARTERY (Left Leg Lower)  Patient Location: PACU  Anesthesia Type:General  Level of Consciousness: awake and alert   Airway & Oxygen Therapy: Patient Spontanous Breathing and Patient connected to face mask oxygen  Post-op Assessment: Report given to RN and Post -op Vital signs reviewed and stable  Post vital signs: Reviewed and stable  Last Vitals:  Vitals Value Taken Time  BP 154/86 11/07/2018  5:45 PM  Temp    Pulse 78 11/07/2018  5:46 PM  Resp 18 11/07/2018  5:46 PM  SpO2 95 % 11/07/2018  5:46 PM  Vitals shown include unvalidated device data.  Last Pain:  Vitals:   11/07/18 1300  TempSrc: Oral  PainSc:       Patients Stated Pain Goal: 0 (11/07/18 0500)  Complications: No apparent anesthesia complications

## 2018-11-07 NOTE — Progress Notes (Signed)
Patient arrived from OR with second unit of blood running, per CRNA.  Blood ended at 650pm, VS normal. Patient transferredt to 4E.

## 2018-11-07 NOTE — Interval H&P Note (Signed)
History and Physical Interval Note:  11/07/2018 12:50 PM  Debra Ball  has presented today for surgery, with the diagnosis of LEFT POPLITEAL OCCLUSION.  The various methods of treatment have been discussed with the patient and family. After consideration of risks, benefits and other options for treatment, the patient has consented to  Procedure(s): THROMBECTOMY POPLITEAL ARTERY POSSIBLE ANTERIOR TIBIAL BYPASS (Left) as a surgical intervention.  The patient's history has been reviewed, patient examined, no change in status, stable for surgery.  I have reviewed the patient's chart and labs.  Questions were answered to the patient's satisfaction.     Durene Cal

## 2018-11-07 NOTE — Op Note (Signed)
Patient name: Debra Ball MRN: [id number] DOB: Aug 23, 1975 Sex: female  11/07/2018 Pre-operative Diagnosis: Ischemic left foot Post-operative diagnosis:  Same Surgeon:  Durene Cal Assistants: Aggie Moats Procedure:   #1: Open exposure of left below-knee popliteal artery   #2: Thrombectomy of left popliteal, anterior tibial, and posterior tibial artery   #3: Vein patch angioplasty, left popliteal artery Anesthesia: General Blood Loss: 100 cc.  The patient received 2 units of blood during the procedure Specimens: Thrombus was sent to pathology  Findings: Subacute thrombus was visualized within the popliteal artery, anterior tibial and posterior tibial artery.  This was relatively easily to recover.  The patient had palpable posterior tibial and anterior tibial pulse at the end of the procedure.  Indications: The patient has been seen in the emergency department several times for left leg pain.  This is been going on for several weeks.  She underwent an arteriogram yesterday which confirmed CT scan findings of an occluded popliteal artery.  She is here today for thrombectomy and possible bypass.  Procedure:  The patient was identified in the holding area and taken to [facility] OR ROOM 16  The patient was then placed supine on the table. general anesthesia was administered.  The patient was prepped and draped in the usual sterile fashion.  A time out was called and antibiotics were administered.  Ultrasound was used to map the course of the saphenous vein in the left leg.  This appeared to be healthy vein measuring 3-4 mm throughout.  Medial below-knee incision was made with a 10 blade.  Cautery was used about subcutaneous tissue down to the fascia which was opened with cautery.  I then entered the popliteal space.  The gastrocnemius muscle was reflected posteriorly.  The soleal attachments to the tibia were divided with cautery.  I then isolated the below-knee popliteal artery, anterior  tibial artery, the posterior tibial and the peroneal artery.  The anterior tibial vein was divided.  The patient was then fully heparinized.  I then used a #11 blade to make an arteriotomy in the distal below-knee popliteal artery.  Congealed thrombus was identified which was completely occlusive.  I then passed a #3 Fogarty catheter down the anterior tibial artery which went all the way onto the foot and then I performed thrombectomy and evacuated all of the thrombus with good backbleeding.  A second pass was then performed and no additional thrombus was recovered.  The artery was flushed with heparin saline and reoccluded.  I then passed the Fogarty catheter down to the posterior tibial artery.  This also went down across the ankle.  Thrombectomy was performed evacuating the thrombus.  There was good backbleeding.  A second negative pass was performed with no additional thrombus.  I tried to direct the catheter down the peroneal artery but was unable to directed into the artery.  There was good backbleeding.  I then flushed this with heparin saline and reoccluded the peroneal artery.  A #3 Fogarty catheter was then advanced retrograde into the superficial femoral artery with mild resistance.  A thrombectomy was performed and the clot was retrieved with the cath intact.  There was excellent inflow.  An additional pass was then performed.  I then harvested the saphenous vein within the incision to use as a patch.  I then performed patch angioplasty to close the arteriotomy in the popliteal artery.  Prior to completion the appropriate flushing maneuvers were performed and the anastomosis was completed.  There were brisk  Doppler signals in the popliteal artery at this point as well as signals in the posterior tibial and anterior tibial artery.  I elected to shoot an arteriogram.  I cannulated the vein patch with a 21-gauge needle.  I performed an arteriogram however we had difficulty getting contrast to go distally.   I then removed the needle and closed the arteriotomy site.  At this point there were palpable pedal pulses and so I elected not to perform an additional arteriogram.  The wound was irrigated and hemostasis was achieved.  I did not reverse the heparin.  The fascia was then reapproximated 2-0 Vicryl.  The subtenons tissue was closed with 2-0 Vicryl and the skin was closed with 3-0 Vicryl followed by Dermabond.  There were no immediate complications.   Disposition: To PACU stable.   Juleen ChinaV. Wells Brabham, M.D., [facility] Center[facility]ACS [facility] [place] Office: [phone] Pager:  [phone]

## 2018-11-07 NOTE — Anesthesia Procedure Notes (Signed)
Arterial Line Insertion Start/End4/15/2020 1:25 PM, 11/07/2018 1:31 PM Performed by: Carmela Rima, CRNA, CRNA  Patient location: Pre-op. Preanesthetic checklist: patient identified, IV checked, site marked, risks and benefits discussed, surgical consent, monitors and equipment checked, pre-op evaluation, timeout performed and anesthesia consent Lidocaine 1% used for infiltration Left, radial was placed Catheter size: 20 G Hand hygiene performed , maximum sterile barriers used  and Seldinger technique used Allen's test indicative of satisfactory collateral circulation Attempts: 1 Procedure performed without using ultrasound guided technique. Following insertion, dressing applied and Biopatch. Post procedure assessment: normal  Patient tolerated the procedure well with no immediate complications.

## 2018-11-07 NOTE — Anesthesia Procedure Notes (Signed)
Procedure Name: Intubation Date/Time: 11/07/2018 2:30 PM Performed by: Neldon Newport, CRNA Pre-anesthesia Checklist: Timeout performed, Patient being monitored, Suction available, Emergency Drugs available and Patient identified Patient Re-evaluated:Patient Re-evaluated prior to induction Oxygen Delivery Method: Circle system utilized Preoxygenation: Pre-oxygenation with 100% oxygen Induction Type: IV induction and Rapid sequence Laryngoscope Size: Mac, 3, 2 and Miller Grade View: Grade III Tube type: Oral Tube size: 7.0 mm Number of attempts: 2 Placement Confirmation: breath sounds checked- equal and bilateral,  positive ETCO2 and ETT inserted through vocal cords under direct vision Secured at: 22 cm Tube secured with: Tape Dental Injury: Teeth and Oropharynx as per pre-operative assessment

## 2018-11-07 NOTE — Progress Notes (Signed)
ANTICOAGULATION CONSULT NOTE - Follow Up Consult  Pharmacy Consult for Heparin Indication: DVT  Allergies  Allergen Reactions  . Hydrocodone Bitartrate Er Hives    Patient Measurements: Height: 5\' 1"  (154.9 cm) Weight: 177 lb 12.8 oz (80.7 kg) IBW/kg (Calculated) : 47.8 Heparin Dosing Weight: 66kg  Vital Signs: Temp: 98.3 F (36.8 C) (04/15 1915) Temp Source: Oral (04/15 1915) BP: 116/63 (04/15 1915) Pulse Rate: 71 (04/15 1800)  Labs: Recent Labs    11/06/18 0402 11/06/18 0921 11/07/18 0441 11/07/18 1553  HGB 9.3*  --  8.3* 6.5*  HCT 31.7*  --  30.3* 19.0*  PLT 261  --  248  --   LABPROT 13.5  --   --   --   INR 1.0  --   --   --   HEPARINUNFRC 0.34 0.34 0.37  --   CREATININE 0.81  --  0.83  --     Estimated Creatinine Clearance: 86.8 mL/min (by C-G formula based on SCr of 0.83 mg/dL).   Assessment: 43 yo female on heparin with popliteal occlusion s/p thrombectomy and vein patch angioplasty. Heparin to restart at 11pm.  -Hg= 6.5 (down from 8.3 this am); 2 units PRBC in procedure  Spoke to Dr. Myra Gianotti and will reduce post-op heparin infusion to a lower rate due to Hg drop.   Goal of Therapy:  Heparin level 0.3-0.7 units/ml Monitor platelets by anticoagulation protocol: Yes   Plan:  -restart heparin at 800 units/hr at 11pm and titrate up to goal -Heparin level in 8 hours and daily wth CBC daily  Harland German, PharmD Clinical Pharmacist **Pharmacist phone directory can now be found on [email or web address] ([email or web address]).  Listed under [facility].

## 2018-11-07 NOTE — Progress Notes (Signed)
ANTICOAGULATION CONSULT NOTE - Follow Up Consult  Pharmacy Consult for Heparin Indication: DVT  Allergies  Allergen Reactions  . Hydrocodone Bitartrate Er Hives    Patient Measurements: Height: 5\' 1"  (154.9 cm) Weight: 177 lb 12.8 oz (80.6 kg) IBW/kg (Calculated) : 47.8 Heparin Dosing Weight: 66kg  Vital Signs: Temp: 98.8 F (37.1 C) (04/15 0500) Temp Source: Oral (04/15 0500) BP: 135/53 (04/15 0500)  Labs: Recent Labs    11/06/18 0402 11/06/18 0921 11/07/18 0441  HGB 9.3*  --  8.3*  HCT 31.7*  --  30.3*  PLT 261  --  248  LABPROT 13.5  --   --   INR 1.0  --   --   HEPARINUNFRC 0.34 0.34 0.37  CREATININE 0.81  --  0.83    Estimated Creatinine Clearance: 86.8 mL/min (by C-G formula based on SCr of 0.83 mg/dL).   Assessment:   Anticoag: none PTA - transferred from [facility] w/ heparin IV at 1000 units/hr. HL 0.37 in goal. Hgb 9.3>8.3. Plts 248 - 4/14: LE angiography   Goal of Therapy:  Heparin level 0.3-0.7 units/ml Monitor platelets by anticoagulation protocol: Yes   Plan:  IV heparin 1000 units/hr Daily HL and CBC 4/15: Thrombectomy poplitea artery possibly anterior tibial bypass  Baran Kuhrt S. Merilynn Finland, PharmD, BCPS Clinical Staff Pharmacist Misty Stanley Stillinger 11/07/2018,11:38 AM

## 2018-11-07 NOTE — Progress Notes (Signed)
@  approx 1930 Paged Dr. Myra Gianotti for order clarification regarding Heparin gtt. Page promptly returned and gtt to be resumed @2200  at previous rate of 1000units/hr.

## 2018-11-07 NOTE — Anesthesia Preprocedure Evaluation (Addendum)
Anesthesia Evaluation  Patient identified by MRN, date of birth, ID band Patient awake    Reviewed: Allergy & Precautions, NPO status , Patient's Chart, lab work & pertinent test results  Airway Mallampati: II  TM Distance: >3 FB     Dental  (+) Dental Advisory Given   Pulmonary Current Smoker,    breath sounds clear to auscultation       Cardiovascular + Peripheral Vascular Disease (L popliteal artery occlusion.)   Rhythm:Regular Rate:Normal     Neuro/Psych negative neurological ROS     GI/Hepatic negative GI ROS, Neg liver ROS,   Endo/Other  negative endocrine ROS  Renal/GU negative Renal ROS     Musculoskeletal   Abdominal   Peds  Hematology  (+) anemia ,   Anesthesia Other Findings   Reproductive/Obstetrics                             Lab Results  Component Value Date   WBC 5.6 11/07/2018   HGB 8.3 (L) 11/07/2018   HCT 30.3 (L) 11/07/2018   MCV 82.3 11/07/2018   PLT 248 11/07/2018   Lab Results  Component Value Date   CREATININE 0.83 11/07/2018   BUN 8 11/07/2018   NA 139 11/07/2018   K 4.1 11/07/2018   CL 106 11/07/2018   CO2 23 11/07/2018    Anesthesia Physical Anesthesia Plan  ASA: III  Anesthesia Plan: General   Post-op Pain Management:    Induction: Intravenous and Rapid sequence  PONV Risk Score and Plan: 2 and Dexamethasone, Ondansetron and Treatment may vary due to age or medical condition  Airway Management Planned: Oral ETT  Additional Equipment: Arterial line  Intra-op Plan:   Post-operative Plan: Extubation in OR  Informed Consent: I have reviewed the patients History and Physical, chart, labs and discussed the procedure including the risks, benefits and alternatives for the proposed anesthesia with the patient or authorized representative who has indicated his/her understanding and acceptance.     Dental advisory given and Dental Advisory  Given  Plan Discussed with: CRNA  Anesthesia Plan Comments:        Anesthesia Quick Evaluation

## 2018-11-08 ENCOUNTER — Encounter (HOSPITAL_COMMUNITY): Payer: Self-pay | Admitting: Surgery

## 2018-11-08 ENCOUNTER — Inpatient Hospital Stay (HOSPITAL_COMMUNITY): Payer: BLUE CROSS/BLUE SHIELD

## 2018-11-08 DIAGNOSIS — I361 Nonrheumatic tricuspid (valve) insufficiency: Secondary | ICD-10-CM

## 2018-11-08 LAB — TYPE AND SCREEN
ABO/RH(D): B POS
Antibody Screen: NEGATIVE
Unit division: 0
Unit division: 0

## 2018-11-08 LAB — BASIC METABOLIC PANEL
Anion gap: 9 (ref 5–15)
BUN: 5 mg/dL — ABNORMAL LOW (ref 6–20)
CO2: 21 mmol/L — ABNORMAL LOW (ref 22–32)
Calcium: 8.7 mg/dL — ABNORMAL LOW (ref 8.9–10.3)
Chloride: 107 mmol/L (ref 98–111)
Creatinine, Ser: 0.88 mg/dL (ref 0.44–1.00)
GFR calc Af Amer: >60 mL/min (ref >60)
GFR calc non Af Amer: >60 mL/min (ref >60)
Glucose, Bld: 170 mg/dL — ABNORMAL HIGH (ref 70–99)
Potassium: 4 mmol/L (ref 3.5–5.1)
Sodium: 137 mmol/L (ref 135–145)

## 2018-11-08 LAB — BPAM RBC
Blood Product Expiration Date: 202005012359
Blood Product Expiration Date: 202005022359
ISSUE DATE / TIME: 202004151617
ISSUE DATE / TIME: 202004151617
Unit Type and Rh: 7300
Unit Type and Rh: 7300

## 2018-11-08 LAB — CBC
HCT: 30.6 % — ABNORMAL LOW (ref 36.0–46.0)
Hemoglobin: 9.3 g/dL — ABNORMAL LOW (ref 12.0–15.0)
MCH: 24.6 pg — ABNORMAL LOW (ref 26.0–34.0)
MCHC: 30.4 g/dL (ref 30.0–36.0)
MCV: 81 fL (ref 80.0–100.0)
Platelets: 272 10*3/uL (ref 150–400)
RBC: 3.78 MIL/uL — ABNORMAL LOW (ref 3.87–5.11)
RDW: 15.6 % — ABNORMAL HIGH (ref 11.5–15.5)
WBC: 9.3 10*3/uL (ref 4.0–10.5)
nRBC: 0 % (ref 0.0–0.2)

## 2018-11-08 LAB — ECHOCARDIOGRAM COMPLETE
Height: 61 in
Weight: 2844.82 oz

## 2018-11-08 LAB — HEPARIN LEVEL (UNFRACTIONATED)
Heparin Unfractionated: 0.23 IU/mL — ABNORMAL LOW (ref 0.30–0.70)
Heparin Unfractionated: 0.31 IU/mL (ref 0.30–0.70)

## 2018-11-08 MED ORDER — DIPHENHYDRAMINE HCL 50 MG/ML IJ SOLN: 50.0000 mg | Freq: Four times a day (QID) | INTRAMUSCULAR | Status: DC | PRN

## 2018-11-08 MED ORDER — HYDROXYZINE HCL 25 MG PO TABS
25.0000 mg | ORAL_TABLET | Freq: Three times a day (TID) | ORAL | Status: DC | PRN
  Administered 2018-11-08: 25 mg via ORAL
  Filled 2018-11-08: qty 1

## 2018-11-08 NOTE — Progress Notes (Signed)
  Echocardiogram 2D Echocardiogram has been performed.  Michela Herst G Rodriquez Thorner 11/08/2018, 9:52 AM

## 2018-11-08 NOTE — Progress Notes (Signed)
Patient transferred to [facility] with front-wheeled walker and back to bed.  Would not walk further due to pain.

## 2018-11-08 NOTE — Progress Notes (Signed)
Patient transferred to [facility] and back to bed with front-wheeled walker.  Would not walk further secondary to pain.

## 2018-11-08 NOTE — TOC Benefit Eligibility Note (Signed)
Transition of Care [facility]) Benefit Eligibility Note    Patient Details  Name: Pax Viall MRN: [id number] Date of Birth: [birth year]   Medication/Dose: ELIQUIS  5 MG BID(XARELTO 20 MG DAILY  CO-PAY- $ 70.00)  Covered?: Yes     Prescription Coverage Preferred Pharmacy: YES([facility])  Spoke with Person/Company/Phone Number:: DENAIA([employer] # [phone])  Co-Pay: $10 .00  Prior Approval: No          Mardene Sayer Phone Number: 11/08/2018, 10:47 AM

## 2018-11-08 NOTE — Progress Notes (Signed)
Patient noted to have swelling, about 3 cm across, on inner/upper lip, left side.  No swelling noted or reported inside mouth, in throat or on tongue.  Benadryl administered.  Will continue to monitor.

## 2018-11-08 NOTE — Progress Notes (Signed)
ANTICOAGULATION CONSULT NOTE - Follow Up Consult  Pharmacy Consult for Heparin Indication: DVT  Allergies  Allergen Reactions  . Hydrocodone Bitartrate Er Hives    Patient Measurements: Height: 5\' 1"  (154.9 cm) Weight: 177 lb 12.8 oz (80.7 kg) IBW/kg (Calculated) : 47.8 Heparin Dosing Weight: 66kg  Vital Signs: Temp: 98.7 F (37.1 C) (04/16 0536) Temp Source: Oral (04/16 0536) BP: 141/72 (04/16 0536) Pulse Rate: 82 (04/16 0536)  Labs: Recent Labs    11/06/18 0402 11/06/18 0921 11/07/18 0441 11/07/18 1553 11/08/18 0310 11/08/18 0644  HGB 9.3*  --  8.3* 6.5* 9.3*  --   HCT 31.7*  --  30.3* 19.0* 30.6*  --   PLT 261  --  248  --  272  --   LABPROT 13.5  --   --   --   --   --   INR 1.0  --   --   --   --   --   HEPARINUNFRC 0.34 0.34 0.37  --   --  0.23*  CREATININE 0.81  --  0.83  --  0.88  --     Estimated Creatinine Clearance: 81.8 mL/min (by C-G formula based on SCr of 0.88 mg/dL).   Assessment:  Anticoag: none PTA - transferred from [facility] w/ heparin. HL resumed post-procedure at lower rate due to Hgb drop. Received 2 units PrBC in procedure 4/15. Hgb 9.3>8.3>6.5>9.3 this AM. Plts stable. HL 0.23 slightly low. Increase heparin back to pre-op rate.  - 4/14: LE angiography - 4/15: Thrombectomy poplitea artery possibly anterior tibial bypass  Goal of Therapy:  Heparin level 0.3-0.7 units/ml Monitor platelets by anticoagulation protocol: Yes   Plan:  Increase IV heparin 1000 units/hr. Recheck in 6 hrs Daily HL and CBC F/u transition to oral anticoagulation   Spencer Peterkin S. Merilynn Finland, PharmD, BCPS Clinical Staff Pharmacist

## 2018-11-08 NOTE — Progress Notes (Signed)
ANTICOAGULATION CONSULT NOTE - Follow Up Consult  Pharmacy Consult for Heparin Indication: DVT  Allergies  Allergen Reactions  . Hydrocodone Bitartrate Er Hives    Patient Measurements: Height: 5\' 1"  (154.9 cm) Weight: 177 lb 12.8 oz (80.7 kg) IBW/kg (Calculated) : 47.8 Heparin Dosing Weight: 66kg  Vital Signs: Temp: 97.9 F (36.6 C) (04/16 1650) Temp Source: Oral (04/16 1650) BP: 135/90 (04/16 1650) Pulse Rate: 87 (04/16 1650)  Labs: Recent Labs    11/06/18 0402  11/07/18 0441 11/07/18 1553 11/08/18 0310 11/08/18 0644 11/08/18 1628  HGB 9.3*  --  8.3* 6.5* 9.3*  --   --   HCT 31.7*  --  30.3* 19.0* 30.6*  --   --   PLT 261  --  248  --  272  --   --   LABPROT 13.5  --   --   --   --   --   --   INR 1.0  --   --   --   --   --   --   HEPARINUNFRC 0.34   < > 0.37  --   --  0.23* 0.31  CREATININE 0.81  --  0.83  --  0.88  --   --    < > = values in this interval not displayed.    Estimated Creatinine Clearance: 81.8 mL/min (by C-G formula based on SCr of 0.88 mg/dL).   Assessment:  Anticoag: none PTA - transferred from [facility] w/ heparin. HL resumed post-procedure at lower rate due to Hgb drop. Received 2 units PrBC in procedure 4/15. Hgb 9.3>8.3>6.5>9.3 this AM. Plts stable. HL 0.23 slightly low. Increase heparin back to pre-op rate.  - 4/14: LE angiography - 4/15: Thrombectomy poplitea artery possibly anterior tibial bypass  Heparin level this evening at goal 0.3. No issues noted.   Goal of Therapy:  Heparin level 0.3-0.7 units/ml Monitor platelets by anticoagulation protocol: Yes   Plan:  Continue IV heparin 1000 units/hr Daily HL and CBC F/u transition to oral anticoagulation  Sheppard Coil PharmD., BCPS Clinical Pharmacist 11/08/2018 7:07 PM

## 2018-11-08 NOTE — Progress Notes (Signed)
[facility] [place]  Subjective  - No complaints.  Left foot feels good.   Objective 134/77 74 98.2 F (36.8 C) (Oral) 14 98%  Intake/Output Summary (Last 24 hours) at 11/08/2018 1310 Last data filed at 11/08/2018 0800 Gross per 24 hour  Intake 1964.94 ml  Output 2700 ml  Net -735.06 ml    Left PT palpable Left BK incision c/d/i  Laboratory Lab Results: Recent Labs    11/07/18 0441 11/07/18 1553 11/08/18 0310  WBC 5.6  --  9.3  HGB 8.3* 6.5* 9.3*  HCT 30.3* 19.0* 30.6*  PLT 248  --  272   BMET Recent Labs    11/07/18 0441 11/07/18 1553 11/08/18 0310  NA 139 140 137  K 4.1 3.7 4.0  CL 106  --  107  CO2 23  --  21*  GLUCOSE 111* 87 170*  BUN 8  --  5*  CREATININE 0.83  --  0.88  CALCIUM 8.6*  --  8.7*    COAG Lab Results  Component Value Date   INR 1.0 11/06/2018   No results found for: PTT  Assessment/Planning:  POD#1 s/p LLE thrombectomy.  Palpable left PT pulse.  Continue heparin gtt.  Will have case management evaluate for PO anticoagulation pending cost with insurance.  Start to mobilize today.    Debra Ball 11/08/2018 1:10 PM --

## 2018-11-09 ENCOUNTER — Inpatient Hospital Stay (HOSPITAL_COMMUNITY): Payer: BLUE CROSS/BLUE SHIELD

## 2018-11-09 LAB — CBC
HCT: 28.7 % — ABNORMAL LOW (ref 36.0–46.0)
Hemoglobin: 8.7 g/dL — ABNORMAL LOW (ref 12.0–15.0)
MCH: 25 pg — ABNORMAL LOW (ref 26.0–34.0)
MCHC: 30.3 g/dL (ref 30.0–36.0)
MCV: 82.5 fL (ref 80.0–100.0)
Platelets: 259 10*3/uL (ref 150–400)
RBC: 3.48 MIL/uL — ABNORMAL LOW (ref 3.87–5.11)
RDW: 16.1 % — ABNORMAL HIGH (ref 11.5–15.5)
WBC: 10.7 10*3/uL — ABNORMAL HIGH (ref 4.0–10.5)
nRBC: 0 % (ref 0.0–0.2)

## 2018-11-09 LAB — HEPARIN LEVEL (UNFRACTIONATED): Heparin Unfractionated: 0.72 IU/mL — ABNORMAL HIGH (ref 0.30–0.70)

## 2018-11-09 MED ORDER — IOHEXOL 350 MG/ML SOLN
75.0000 mL | Freq: Once | INTRAVENOUS | Status: AC | PRN
  Administered 2018-11-09: 75 mL via INTRAVENOUS

## 2018-11-09 MED ORDER — APIXABAN 5 MG PO TABS
10.0000 mg | ORAL_TABLET | Freq: Two times a day (BID) | ORAL | Status: DC
  Administered 2018-11-09 – 2018-11-10 (×3): 10 mg via ORAL
  Filled 2018-11-09 (×3): qty 2

## 2018-11-09 MED ORDER — APIXABAN 5 MG PO TABS: 5.0000 mg | ORAL_TABLET | Freq: Two times a day (BID) | ORAL | Status: DC

## 2018-11-09 NOTE — Progress Notes (Addendum)
ANTICOAGULATION CONSULT NOTE - Follow Up Consult  Pharmacy Consult for Heparin>> apixaban Indication: DVT  Allergies  Allergen Reactions  . Hydrocodone Bitartrate Er Hives    Patient Measurements: Height: 5\' 1"  (154.9 cm) Weight: 177 lb 12.8 oz (80.7 kg) IBW/kg (Calculated) : 47.8 Heparin Dosing Weight: 66kg  Vital Signs: Temp: 98.6 F (37 C) (04/17 0400) Temp Source: Oral (04/17 0400) BP: 134/85 (04/17 0400)  Labs: Recent Labs    11/07/18 0441 11/07/18 1553 11/08/18 0310 11/08/18 0644 11/08/18 1628 11/09/18 0314  HGB 8.3* 6.5* 9.3*  --   --  8.7*  HCT 30.3* 19.0* 30.6*  --   --  28.7*  PLT 248  --  272  --   --  259  HEPARINUNFRC 0.37  --   --  0.23* 0.31 0.72*  CREATININE 0.83  --  0.88  --   --   --     Estimated Creatinine Clearance: 81.8 mL/min (by C-G formula based on SCr of 0.88 mg/dL).   Assessment: 43 yo female with DVT on heparin s/p thrombectomy. Pharmacy consulted to change to apixaban -Hg= 8.7 (up from 6.5 on 4/15)      Goal of Therapy:  Heparin level 0.3-0.7 units/ml Monitor platelets by anticoagulation protocol: Yes   Plan:  -apixaban 10mg  po bid for 7 days then change to 5mg  po bid -Will follow patient progress  Harland German, PharmD Clinical Pharmacist **Pharmacist phone directory can now be found on [email or web address] ([email or web address]).  Listed under [facility].

## 2018-11-09 NOTE — Discharge Instructions (Signed)
Information on my medicine - ELIQUIS (apixaban)  Why was Eliquis prescribed for you? Eliquis was prescribed to treat blood clots that may have been found in the veins of your legs (deep vein thrombosis) or in your lungs (pulmonary embolism) and to reduce the risk of them occurring again.  What do You need to know about Eliquis ? The starting dose is 10 mg (two 5 mg tablets) taken TWICE daily for the FIRST SEVEN (7) DAYS, then on 11/16/18 the dose is reduced to ONE 5 mg tablet taken TWICE daily.  Eliquis may be taken with or without food.   Try to take the dose about the same time in the morning and in the evening. If you have difficulty swallowing the tablet whole please discuss with your pharmacist how to take the medication safely.  Take Eliquis exactly as prescribed and DO NOT stop taking Eliquis without talking to the doctor who prescribed the medication.  Stopping may increase your risk of developing a new blood clot.  Refill your prescription before you run out.  After discharge, you should have regular check-up appointments with your healthcare provider that is prescribing your Eliquis.    What do you do if you miss a dose? If a dose of ELIQUIS is not taken at the scheduled time, take it as soon as possible on the same day and twice-daily administration should be resumed. The dose should not be doubled to make up for a missed dose.  Important Safety Information A possible side effect of Eliquis is bleeding. You should call your healthcare provider right away if you experience any of the following: ? Bleeding from an injury or your nose that does not stop. ? Unusual colored urine (red or dark brown) or unusual colored stools (red or black). ? Unusual bruising for unknown reasons. ? A serious fall or if you hit your head (even if there is no bleeding).  Some medicines may interact with Eliquis and might increase your risk of bleeding or clotting while on Eliquis. To help avoid  this, consult your healthcare provider or pharmacist prior to using any new prescription or non-prescription medications, including herbals, vitamins, non-steroidal anti-inflammatory drugs (NSAIDs) and supplements.  This website has more information on Eliquis (apixaban): http://www.eliquis.com/eliquis/home

## 2018-11-09 NOTE — Progress Notes (Addendum)
  Progress Note    11/09/2018 8:03 AM 2 Days Post-Op  Subjective:  Still having pain with walking   Vitals:   11/08/18 2011 11/09/18 0400  BP: (!) 141/89 134/85  Pulse: 67   Resp: 17 20  Temp: 98.1 F (36.7 C) 98.6 F (37 C)  SpO2: 99% 98%   Physical Exam: Lungs:  Non labored Incisions:  L popliteal incision soft, no drainage, skin edges approximated well Extremities:  Palpable L PT, soft but palpable L DP Neurologic: A&O  CBC    Component Value Date/Time   WBC 10.7 (H) 11/09/2018 0314   RBC 3.48 (L) 11/09/2018 0314   HGB 8.7 (L) 11/09/2018 0314   HCT 28.7 (L) 11/09/2018 0314   PLT 259 11/09/2018 0314   MCV 82.5 11/09/2018 0314   MCH 25.0 (L) 11/09/2018 0314   MCHC 30.3 11/09/2018 0314   RDW 16.1 (H) 11/09/2018 0314    BMET    Component Value Date/Time   NA 137 11/08/2018 0310   K 4.0 11/08/2018 0310   CL 107 11/08/2018 0310   CO2 21 (L) 11/08/2018 0310   GLUCOSE 170 (H) 11/08/2018 0310   BUN 5 (L) 11/08/2018 0310   CREATININE 0.88 11/08/2018 0310   CALCIUM 8.7 (L) 11/08/2018 0310   GFRNONAA >60 11/08/2018 0310   GFRAA >60 11/08/2018 0310    INR    Component Value Date/Time   INR 1.0 11/06/2018 0402     Intake/Output Summary (Last 24 hours) at 11/09/2018 0803 Last data filed at 11/09/2018 0631 Gross per 24 hour  Intake 955 ml  Output -  Net 955 ml     Assessment/Plan:  43 y.o. female is s/p LLE tibial and popliteal thrombectomy with vein patch angioplasty 2 Days Post-Op   L popliteal incision unremarkable Transition from IV heparin to Eliquis Check CTA chest as part of embolic workup; echo unremarkable Home when pain controlled and mobility increased   Emilie Rutter, PA-C [facility] [phone] 11/09/2018 8:03 AM  I have seen and evaluated the patient. I agree with the PA note as documented above. Excellent left PT palpable.  Echo yesterday with no evidence of thrombus.  Will get CTA chest today to complete  embolic work-up.  Transition to Eliquis.  Incision in left leg looks good.  Home when mobility improves.  Cephus Shelling, MD [facility] [place] Office: [phone] Pager: [phone]

## 2018-11-10 LAB — CBC
HCT: 30.5 % — ABNORMAL LOW (ref 36.0–46.0)
Hemoglobin: 9.3 g/dL — ABNORMAL LOW (ref 12.0–15.0)
MCH: 24.9 pg — ABNORMAL LOW (ref 26.0–34.0)
MCHC: 30.5 g/dL (ref 30.0–36.0)
MCV: 81.6 fL (ref 80.0–100.0)
Platelets: 249 10*3/uL (ref 150–400)
RBC: 3.74 MIL/uL — ABNORMAL LOW (ref 3.87–5.11)
RDW: 16 % — ABNORMAL HIGH (ref 11.5–15.5)
WBC: 7.3 10*3/uL (ref 4.0–10.5)
nRBC: 0 % (ref 0.0–0.2)

## 2018-11-10 MED ORDER — APIXABAN 5 MG PO TABS: 5.0000 mg | ORAL_TABLET | Freq: Two times a day (BID) | ORAL | 4 refills | Status: DC

## 2018-11-10 MED ORDER — TRAMADOL HCL 50 MG PO TABS: 50.0000 mg | ORAL_TABLET | Freq: Four times a day (QID) | ORAL | 0 refills | Status: DC | PRN

## 2018-11-10 MED ORDER — APIXABAN 5 MG PO TABS: 10.0000 mg | ORAL_TABLET | Freq: Two times a day (BID) | ORAL | 0 refills | Status: DC

## 2018-11-10 NOTE — Progress Notes (Addendum)
[facility] [place]  Subjective  - Doing better.   Objective (!) 141/83 76 99 F (37.2 C) (Oral) (!) 23 98%  Intake/Output Summary (Last 24 hours) at 11/10/2018 0815 Last data filed at 11/09/2018 1100 Gross per 24 hour  Intake 480 ml  Output -  Net 480 ml    Left LE palpable PT Left LE below knee incision healing well  Heart RRR Lungs non labored breathing  Assessment/Planning: POD # 3 43 y.o. female is s/p LLE tibial and popliteal thrombectomy with vein patch angioplasty  Stable disposition with palpable pedal pulses Plan to discharge with rolling walker.  Will send her home on Eliquis.   F/U in 4-6 weeks with Dr. Myra Gianotti.  Mosetta Pigeon 11/10/2018 8:15 AM --  Laboratory Lab Results: Recent Labs    11/09/18 0314 11/10/18 0256  WBC 10.7* 7.3  HGB 8.7* 9.3*  HCT 28.7* 30.5*  PLT 259 249   BMET Recent Labs    11/07/18 1553 11/08/18 0310  NA 140 137  K 3.7 4.0  CL  --  107  CO2  --  21*  GLUCOSE 87 170*  BUN  --  5*  CREATININE  --  0.88  CALCIUM  --  8.7*    COAG Lab Results  Component Value Date   INR 1.0 11/06/2018   No results found for: PTT    I have interviewed and examined patient with PA and agree with assessment and plan above.  Plan for discharge today on Eliquis.  Very strongly palpable posterior tibial pulse on exam this morning with only mild residual numbness in the foot.  Keelin Neville C. Randie Heinz, MD [facility] [place] Office: [phone] Pager: [phone]

## 2018-11-10 NOTE — Discharge Summary (Signed)
Physician Discharge Summary  Patient ID: Debra Ball MRN: [id number] DOB/AGE: 03-17-1976 43 y.o.  Admit date: 11/05/2018 Discharge date: 11/10/18  Admission Diagnosis: Ischemia left foot  Discharge Diagnoses:  Same  Secondary Diagnoses: Active Problems:   Popliteal artery occlusion, left [facility])   Procedures: 4/14 abdominal aortogram with bilateral lower extremity runoff 4/15 left lower extremity thromboembolectomy with vein patch angioplasty left popliteal arterygood  Discharged Condition: good  Hospital Course: Patient was admitted underwent angiography for ischemia of her left lower extremity.  Following day she was taken the operating room for left lower extremity thromboembolectomy.  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  She was transitioned to Eliquis.  She was able to ambulate with the help of a rolling walker.  On postoperative day #2 from her open surgery she was ready for discharge home.  Consults:  Treatment Team:  Cephus Shelling, MD  Significant Diagnostic Studies: CBC CBC Latest Ref Rng & Units 11/10/2018 11/09/2018 11/08/2018  WBC 4.0 - 10.5 K/uL 7.3 10.7(H) 9.3  Hemoglobin 12.0 - 15.0 g/dL 1.9(F) 7.9(K) 2.4(O)  Hematocrit 36.0 - 46.0 % 30.5(L) 28.7(L) 30.6(L)  Platelets 150 - 400 K/uL 249 259 272     BMET @LASTCHEMISTRY @  COAG Lab Results  Component Value Date   INR 1.0 11/06/2018   No results found for: PTT  Disposition: Discharge disposition: 01-Home or Self Care       Discharge Instructions    Call MD for:  redness, tenderness, or signs of infection (pain, swelling, bleeding, redness, odor or green/yellow discharge around incision site)   Complete by:  As directed    Call MD for:  severe or increased pain, loss or decreased feeling  in affected limb(s)   Complete by:  As directed    Call MD for:  temperature >100.5    Complete by:  As directed    Discharge instructions   Complete by:  As directed    You may shower daily.  Walk as much as you tolerate and gradually increase.   For home use only DME Walker rolling   Complete by:  As directed    Patient needs a walker to treat with the following condition:  Post-operative state   Resume previous diet   Complete by:  As directed      Allergies as of 11/10/2018      Reactions   Hydrocodone Bitartrate Er Hives      Medication List    STOP taking these medications   ibuprofen 600 MG tablet Commonly known as:  ADVIL     TAKE these medications   apixaban 5 MG Tabs tablet Commonly known as:  ELIQUIS Take 2 tablets (10 mg total) by mouth 2 (two) times daily.   apixaban 5 MG Tabs tablet Commonly known as:  ELIQUIS Take 1 tablet (5 mg total) by mouth 2 (two) times daily. Start taking on:  November 16, 2018   traMADol 50 MG tablet Commonly known as:  Ultram Take 1 tablet (50 mg total) by mouth every 6 (six) hours as needed.            Durable Medical Equipment  (From admission, onward)         Start     Ordered   11/10/18 0000  For home use only DME Walker rolling    Question:  Patient needs a walker to treat with the  following condition  Answer:  Post-operative state   11/10/18 0836         Follow-up Information    Nada LibmanBrabham, Vance W, MD Follow up in 5 week(s).   Specialties:  Vascular Surgery, Cardiology Why:  office will call Contact information: [address] [place] [address] [phone]           Signed: Luanna SalkBrandon C. Randie Heinzain, MD [facility] [place] Office: [phone] Pager: [phone]   11/10/2018, 8:42 AM

## 2018-11-10 NOTE — Care Management (Addendum)
RW ordered for patient discharge.  RW will be delivered to room prior to d/c.   Pt given Eliquis 30 day and copay cards.  Pt copay is $10.  Pt will verify in stock at pharmacy.

## 2018-11-12 NOTE — Anesthesia Postprocedure Evaluation (Signed)
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  Procedure(s) Performed: THROMBECTOMY POPLITEAL, ANTERIOR TIBIAL ARTERY, POSTERIOR TIBIAL ARTERY, AND VEIN PATCH ANTERIOR ARTERY (Left Leg Lower)     Patient location during evaluation: PACU Anesthesia Type: General Level of consciousness: awake and alert Pain management: pain level controlled Vital Signs Assessment: post-procedure vital signs reviewed and stable Respiratory status: spontaneous breathing, nonlabored ventilation, respiratory function stable and patient connected to nasal cannula oxygen Cardiovascular status: blood pressure returned to baseline and stable Postop Assessment: no apparent nausea or vomiting Anesthetic complications: no    Last Vitals:  Vitals:   11/09/18 1945 11/10/18 0511  BP: 124/69 (!) 141/83  Pulse: 77 76  Resp: 18 (!) 23  Temp: 37 C 37.2 C  SpO2: 100% 98%    Last Pain:  Vitals:   11/10/18 0845  TempSrc:   PainSc: 0-No pain                 Meliah Appleman COKER

## 2018-11-15 ENCOUNTER — Telehealth: Payer: Self-pay | Admitting: Surgery

## 2018-11-15 NOTE — Telephone Encounter (Signed)
-----   Message from Lars Mage, [place] sent at 11/10/2018  8:28 AM EDT -----  F/U With Dr. Myra Gianotti in 4-6 weeks needs ABI's and duplex arterial of left LE.

## 2018-11-15 NOTE — Telephone Encounter (Signed)
sch appt lvm mld ltr 12/10/2018 1pm ABI 2pm LLE 215pm p/o  MD

## 2018-11-20 ENCOUNTER — Other Ambulatory Visit: Payer: Self-pay

## 2018-11-20 DIAGNOSIS — I70202 Unspecified atherosclerosis of native arteries of extremities, left leg: Secondary | ICD-10-CM

## 2018-12-01 ENCOUNTER — Inpatient Hospital Stay (HOSPITAL_COMMUNITY): Payer: BLUE CROSS/BLUE SHIELD | Admitting: Anesthesiology

## 2018-12-01 ENCOUNTER — Inpatient Hospital Stay (HOSPITAL_COMMUNITY)
Admission: EM | Admit: 2018-12-01 | Discharge: 2018-12-07 | DRG: 271 | Disposition: A | Discharge status: Rehabilitation Facility | Payer: BLUE CROSS/BLUE SHIELD | Source: Other Acute Inpatient Hospital | Attending: Surgery | Admitting: Surgery

## 2018-12-01 ENCOUNTER — Encounter (HOSPITAL_COMMUNITY)
Admission: EM | Disposition: A | Discharge status: Rehabilitation Facility | Payer: Self-pay | Source: Other Acute Inpatient Hospital | Attending: Surgery

## 2018-12-01 ENCOUNTER — Other Ambulatory Visit (HOSPITAL_COMMUNITY): Payer: Self-pay | Admitting: *Deleted

## 2018-12-01 DIAGNOSIS — W19XXXD Unspecified fall, subsequent encounter: Secondary | ICD-10-CM | POA: Diagnosis not present

## 2018-12-01 DIAGNOSIS — Z7901 Long term (current) use of anticoagulants: Secondary | ICD-10-CM | POA: Diagnosis not present

## 2018-12-01 DIAGNOSIS — I739 Peripheral vascular disease, unspecified: Secondary | ICD-10-CM | POA: Diagnosis not present

## 2018-12-01 DIAGNOSIS — F17210 Nicotine dependence, cigarettes, uncomplicated: Secondary | ICD-10-CM | POA: Diagnosis present

## 2018-12-01 DIAGNOSIS — Z89512 Acquired absence of left leg below knee: Secondary | ICD-10-CM | POA: Diagnosis not present

## 2018-12-01 DIAGNOSIS — I70229 Atherosclerosis of native arteries of extremities with rest pain, unspecified extremity: Secondary | ICD-10-CM

## 2018-12-01 DIAGNOSIS — I998 Other disorder of circulatory system: Secondary | ICD-10-CM | POA: Diagnosis not present

## 2018-12-01 DIAGNOSIS — Z79891 Long term (current) use of opiate analgesic: Secondary | ICD-10-CM

## 2018-12-01 DIAGNOSIS — I70292 Other atherosclerosis of native arteries of extremities, left leg: Principal | ICD-10-CM | POA: Diagnosis present

## 2018-12-01 DIAGNOSIS — Z4781 Encounter for orthopedic aftercare following surgical amputation: Secondary | ICD-10-CM | POA: Diagnosis not present

## 2018-12-01 DIAGNOSIS — Z885 Allergy status to narcotic agent status: Secondary | ICD-10-CM

## 2018-12-01 DIAGNOSIS — K5900 Constipation, unspecified: Secondary | ICD-10-CM | POA: Diagnosis present

## 2018-12-01 DIAGNOSIS — R000 Tachycardia, unspecified: Secondary | ICD-10-CM | POA: Diagnosis not present

## 2018-12-01 DIAGNOSIS — D62 Acute posthemorrhagic anemia: Secondary | ICD-10-CM | POA: Diagnosis not present

## 2018-12-01 DIAGNOSIS — K5903 Drug induced constipation: Secondary | ICD-10-CM | POA: Diagnosis not present

## 2018-12-01 DIAGNOSIS — Z79899 Other long term (current) drug therapy: Secondary | ICD-10-CM

## 2018-12-01 DIAGNOSIS — F413 Other mixed anxiety disorders: Secondary | ICD-10-CM | POA: Diagnosis not present

## 2018-12-01 DIAGNOSIS — Z1159 Encounter for screening for other viral diseases: Secondary | ICD-10-CM | POA: Diagnosis not present

## 2018-12-01 DIAGNOSIS — I743 Embolism and thrombosis of arteries of the lower extremities: Secondary | ICD-10-CM | POA: Diagnosis not present

## 2018-12-01 DIAGNOSIS — G546 Phantom limb syndrome with pain: Secondary | ICD-10-CM | POA: Diagnosis not present

## 2018-12-01 DIAGNOSIS — T402X5A Adverse effect of other opioids, initial encounter: Secondary | ICD-10-CM | POA: Diagnosis not present

## 2018-12-01 DIAGNOSIS — I7090 Unspecified atherosclerosis: Secondary | ICD-10-CM | POA: Diagnosis not present

## 2018-12-01 DIAGNOSIS — I10 Essential (primary) hypertension: Secondary | ICD-10-CM | POA: Diagnosis not present

## 2018-12-01 DIAGNOSIS — G8918 Other acute postprocedural pain: Secondary | ICD-10-CM | POA: Diagnosis not present

## 2018-12-01 DIAGNOSIS — W19XXXA Unspecified fall, initial encounter: Secondary | ICD-10-CM | POA: Diagnosis not present

## 2018-12-01 HISTORY — PX: PATCH ANGIOPLASTY: SHX6230

## 2018-12-01 HISTORY — PX: AORTOGRAM: SHX6300

## 2018-12-01 HISTORY — DX: Gestational (pregnancy-induced) hypertension without significant proteinuria, unspecified trimester: O13.9

## 2018-12-01 HISTORY — PX: TEE WITHOUT CARDIOVERSION: SHX5443

## 2018-12-01 HISTORY — PX: THROMBECTOMY FEMORAL ARTERY: SHX6406

## 2018-12-01 LAB — PREPARE RBC (CROSSMATCH)

## 2018-12-01 SURGERY — THROMBECTOMY, ARTERY, FEMORAL
Anesthesia: General | Site: Leg Upper | Laterality: Left

## 2018-12-01 MED ORDER — FENTANYL CITRATE (PF) 100 MCG/2ML IJ SOLN
INTRAMUSCULAR | Status: DC | PRN
  Administered 2018-12-01: 50 ug via INTRAVENOUS
  Administered 2018-12-01 (×2): 100 ug via INTRAVENOUS

## 2018-12-01 MED ORDER — PROPOFOL 10 MG/ML IV BOLUS
INTRAVENOUS | Status: DC | PRN
  Administered 2018-12-01: 160 mg via INTRAVENOUS

## 2018-12-01 MED ORDER — LACTATED RINGERS IV SOLN
INTRAVENOUS | Status: DC | PRN
  Administered 2018-12-01 – 2018-12-02 (×2): via INTRAVENOUS

## 2018-12-01 MED ORDER — SUCCINYLCHOLINE CHLORIDE 200 MG/10ML IV SOSY
PREFILLED_SYRINGE | INTRAVENOUS | Status: AC
  Filled 2018-12-01: qty 10

## 2018-12-01 MED ORDER — FENTANYL CITRATE (PF) 250 MCG/5ML IJ SOLN
INTRAMUSCULAR | Status: AC
  Filled 2018-12-01: qty 5

## 2018-12-01 MED ORDER — MIDAZOLAM HCL 2 MG/2ML IJ SOLN
INTRAMUSCULAR | Status: AC
  Filled 2018-12-01: qty 2

## 2018-12-01 MED ORDER — LIDOCAINE 2% (20 MG/ML) 5 ML SYRINGE
INTRAMUSCULAR | Status: AC
  Filled 2018-12-01: qty 5

## 2018-12-01 MED ORDER — SODIUM CHLORIDE 0.9 % IV SOLN
INTRAVENOUS | Status: AC
  Filled 2018-12-01: qty 1.2

## 2018-12-01 MED ORDER — LACTATED RINGERS IV SOLN
INTRAVENOUS | Status: DC | PRN
  Administered 2018-12-01: 23:00:00 via INTRAVENOUS

## 2018-12-01 MED ORDER — DEXAMETHASONE SODIUM PHOSPHATE 4 MG/ML IJ SOLN
INTRAMUSCULAR | Status: DC | PRN
  Administered 2018-12-01: 4 mg via INTRAVENOUS

## 2018-12-01 MED ORDER — MIDAZOLAM HCL 5 MG/5ML IJ SOLN
INTRAMUSCULAR | Status: DC | PRN
  Administered 2018-12-01 (×2): 1 mg via INTRAVENOUS

## 2018-12-01 MED ORDER — SODIUM CHLORIDE 0.9 % IV SOLN
INTRAVENOUS | Status: DC | PRN
  Administered 2018-12-01: 500 mL

## 2018-12-01 MED ORDER — CEFAZOLIN SODIUM 1 G IJ SOLR
INTRAMUSCULAR | Status: DC | PRN
  Administered 2018-12-01: 2 g via INTRAMUSCULAR

## 2018-12-01 MED ORDER — SODIUM CHLORIDE (PF) 0.9 % IJ SOLN
INTRAVENOUS | Status: DC | PRN
  Administered 2018-12-01: 150 mL via INTRAMUSCULAR

## 2018-12-01 MED ORDER — ROCURONIUM BROMIDE 10 MG/ML (PF) SYRINGE
PREFILLED_SYRINGE | INTRAVENOUS | Status: AC
  Filled 2018-12-01: qty 10

## 2018-12-01 MED ORDER — PROPOFOL 10 MG/ML IV BOLUS
INTRAVENOUS | Status: AC
  Filled 2018-12-01: qty 20

## 2018-12-01 MED ORDER — SODIUM CHLORIDE 0.9 % IV SOLN
INTRAVENOUS | Status: DC | PRN
  Administered 2018-12-01: 20 ug/min via INTRAVENOUS

## 2018-12-01 MED ORDER — 0.9 % SODIUM CHLORIDE (POUR BTL) OPTIME
TOPICAL | Status: DC | PRN
  Administered 2018-12-01: 2000 mL

## 2018-12-01 MED ORDER — DEXAMETHASONE SODIUM PHOSPHATE 10 MG/ML IJ SOLN
INTRAMUSCULAR | Status: AC
  Filled 2018-12-01: qty 1

## 2018-12-01 MED ORDER — ROCURONIUM BROMIDE 100 MG/10ML IV SOLN
INTRAVENOUS | Status: DC | PRN
  Administered 2018-12-01: 50 mg via INTRAVENOUS
  Administered 2018-12-01: 20 mg via INTRAVENOUS
  Administered 2018-12-01: 10 mg via INTRAVENOUS
  Administered 2018-12-01: 20 mg via INTRAVENOUS

## 2018-12-01 MED ORDER — HEPARIN SODIUM (PORCINE) 1000 UNIT/ML IJ SOLN
INTRAMUSCULAR | Status: AC
  Filled 2018-12-01: qty 1

## 2018-12-01 MED ORDER — PHENYLEPHRINE 40 MCG/ML (10ML) SYRINGE FOR IV PUSH (FOR BLOOD PRESSURE SUPPORT)
PREFILLED_SYRINGE | INTRAVENOUS | Status: DC | PRN
  Administered 2018-12-01 (×2): 80 ug via INTRAVENOUS

## 2018-12-01 MED ORDER — SUCCINYLCHOLINE CHLORIDE 20 MG/ML IJ SOLN
INTRAMUSCULAR | Status: DC | PRN
  Administered 2018-12-01: 100 mg via INTRAVENOUS

## 2018-12-01 MED ORDER — LIDOCAINE HCL (CARDIAC) PF 100 MG/5ML IV SOSY
PREFILLED_SYRINGE | INTRAVENOUS | Status: DC | PRN
  Administered 2018-12-01: 60 mg via INTRAVENOUS

## 2018-12-01 MED ORDER — SODIUM CHLORIDE 0.9 % IV SOLN: 10.0000 mL/h | Freq: Once | INTRAVENOUS | Status: DC

## 2018-12-01 MED ORDER — ONDANSETRON HCL 4 MG/2ML IJ SOLN
INTRAMUSCULAR | Status: AC
  Filled 2018-12-01: qty 2

## 2018-12-01 SURGICAL SUPPLY — 82 items
BAG SNAP BAND KOVER 36X36 (MISCELLANEOUS) ×5 IMPLANT
BANDAGE ESMARK 6X9 LF (GAUZE/BANDAGES/DRESSINGS) IMPLANT
BNDG ESMARK 6X9 LF (GAUZE/BANDAGES/DRESSINGS)
CANISTER SUCT 3000ML PPV (MISCELLANEOUS) ×5 IMPLANT
CANNULA VESSEL 3MM 2 BLNT TIP (CANNULA) ×5 IMPLANT
CATH EMB 2FR 60CM (CATHETERS) ×5 IMPLANT
CATH EMB 3FR 80CM (CATHETERS) ×5 IMPLANT
CATH EMB 4FR 80CM (CATHETERS) ×5 IMPLANT
CATH EMB 5FR 80CM (CATHETERS) ×5 IMPLANT
CATH OMNI FLUSH 5F 65CM (CATHETERS) ×5 IMPLANT
CLIP VESOCCLUDE MED 24/CT (CLIP) ×5 IMPLANT
CLIP VESOCCLUDE SM WIDE 24/CT (CLIP) ×5 IMPLANT
CLOSURE MYNX CONTROL 5F (Vascular Products) ×5 IMPLANT
CONT SPEC 4OZ CLIKSEAL STRL BL (MISCELLANEOUS) ×5 IMPLANT
COVER DOME SNAP 22 D (MISCELLANEOUS) ×5 IMPLANT
COVER PROBE W GEL 5X96 (DRAPES) ×5 IMPLANT
COVER WAND RF STERILE (DRAPES) ×5 IMPLANT
CUFF TOURNIQUET SINGLE 24IN (TOURNIQUET CUFF) IMPLANT
CUFF TOURNIQUET SINGLE 34IN LL (TOURNIQUET CUFF) IMPLANT
CUFF TOURNIQUET SINGLE 44IN (TOURNIQUET CUFF) IMPLANT
DERMABOND ADVANCED (GAUZE/BANDAGES/DRESSINGS) ×2
DERMABOND ADVANCED .7 DNX12 (GAUZE/BANDAGES/DRESSINGS) ×3 IMPLANT
DEVICE TORQUE H2O (MISCELLANEOUS) ×5 IMPLANT
DRAIN CHANNEL 15F RND FF W/TCR (WOUND CARE) IMPLANT
DRAPE HALF SHEET 40X57 (DRAPES) IMPLANT
DRAPE X-RAY CASS 24X20 (DRAPES) IMPLANT
DRSG COVADERM 4X6 (GAUZE/BANDAGES/DRESSINGS) ×10 IMPLANT
DRSG COVADERM 4X8 (GAUZE/BANDAGES/DRESSINGS) ×5 IMPLANT
ELECT REM PT RETURN 9FT ADLT (ELECTROSURGICAL) ×5
ELECTRODE REM PT RTRN 9FT ADLT (ELECTROSURGICAL) ×3 IMPLANT
EVACUATOR SILICONE 100CC (DRAIN) IMPLANT
GLOVE BIOGEL PI IND STRL 7.5 (GLOVE) ×3 IMPLANT
GLOVE BIOGEL PI INDICATOR 7.5 (GLOVE) ×2
GLOVE SURG SS PI 7.5 STRL IVOR (GLOVE) ×5 IMPLANT
GOWN STRL REUS W/ TWL LRG LVL3 (GOWN DISPOSABLE) ×6 IMPLANT
GOWN STRL REUS W/ TWL XL LVL3 (GOWN DISPOSABLE) ×6 IMPLANT
GOWN STRL REUS W/TWL LRG LVL3 (GOWN DISPOSABLE) ×4
GOWN STRL REUS W/TWL XL LVL3 (GOWN DISPOSABLE) ×4
GUIDEWIRE ANGLED .035X150CM (WIRE) ×5 IMPLANT
HEMOSTAT SNOW SURGICEL 2X4 (HEMOSTASIS) ×5 IMPLANT
INSERT FOGARTY SM (MISCELLANEOUS) IMPLANT
KIT BASIN OR (CUSTOM PROCEDURE TRAY) ×5 IMPLANT
KIT TURNOVER KIT B (KITS) ×5 IMPLANT
MARKER GRAFT CORONARY BYPASS (MISCELLANEOUS) IMPLANT
NS IRRIG 1000ML POUR BTL (IV SOLUTION) ×10 IMPLANT
PACK PERIPHERAL VASCULAR (CUSTOM PROCEDURE TRAY) ×5 IMPLANT
PAD ARMBOARD 7.5X6 YLW CONV (MISCELLANEOUS) ×10 IMPLANT
PATCH VASC XENOSURE 1CMX6CM (Vascular Products) ×2 IMPLANT
PATCH VASC XENOSURE 1X6 (Vascular Products) ×3 IMPLANT
SET COLLECT BLD 21X3/4 12 (NEEDLE) IMPLANT
SET MICROPUNCTURE 5F STIFF (MISCELLANEOUS) ×5 IMPLANT
SHEATH PINNACLE 5F 10CM (SHEATH) ×5 IMPLANT
STAPLER VISISTAT 35W (STAPLE) ×5 IMPLANT
STOPCOCK 4 WAY LG BORE MALE ST (IV SETS) IMPLANT
STOPCOCK MORSE 400PSI 3WAY (MISCELLANEOUS) ×5 IMPLANT
SUT ETHILON 3 0 PS 1 (SUTURE) ×10 IMPLANT
SUT GORETEX 6.0 TT13 (SUTURE) IMPLANT
SUT GORETEX 6.0 TT9 (SUTURE) IMPLANT
SUT PROLENE 5 0 C 1 24 (SUTURE) ×5 IMPLANT
SUT PROLENE 6 0 BV (SUTURE) ×50 IMPLANT
SUT PROLENE 7 0 BV 1 (SUTURE) IMPLANT
SUT SILK 2 0 SH (SUTURE) ×5 IMPLANT
SUT SILK 3 0 (SUTURE)
SUT SILK 3-0 18XBRD TIE 12 (SUTURE) IMPLANT
SUT VIC AB 2-0 CT1 27 (SUTURE) ×2
SUT VIC AB 2-0 CT1 TAPERPNT 27 (SUTURE) ×3 IMPLANT
SUT VIC AB 3-0 SH 27 (SUTURE) ×4
SUT VIC AB 3-0 SH 27X BRD (SUTURE) ×6 IMPLANT
SUT VIC AB 4-0 PS2 18 (SUTURE) ×5 IMPLANT
SUT VICRYL 4-0 PS2 18IN ABS (SUTURE) ×5 IMPLANT
SYR 10ML LL (SYRINGE) ×10 IMPLANT
SYR 20CC LL (SYRINGE) ×5 IMPLANT
SYR 3ML LL SCALE MARK (SYRINGE) ×10 IMPLANT
SYR TB 1ML 26GX3/8 SAFETY (SYRINGE) ×5 IMPLANT
SYR TB 1ML LUER SLIP (SYRINGE) ×5 IMPLANT
TOWEL GREEN STERILE (TOWEL DISPOSABLE) ×5 IMPLANT
TRAY FOLEY MTR SLVR 16FR STAT (SET/KITS/TRAYS/PACK) ×5 IMPLANT
TUBING EXTENTION W/L.L. (IV SETS) IMPLANT
TUBING INJECTOR 48 (MISCELLANEOUS) ×5 IMPLANT
UNDERPAD 30X30 (UNDERPADS AND DIAPERS) ×5 IMPLANT
WATER STERILE IRR 1000ML POUR (IV SOLUTION) ×5 IMPLANT
WIRE BENTSON .035X145CM (WIRE) ×5 IMPLANT

## 2018-12-01 NOTE — H&P (Signed)
[facility] [place]  Patient name: Debra Ball MRN: [id number] DOB: [birth year] Sex: female     HISTORY OF PRESENT ILLNESS:   Debra Ball is a 43 y.o. female, who initially presented on 11/06/2018 with a left popliteal artery occlusion.  She was taken to the operating room on 11/07/2018 and underwent thrombectomy of the left popliteal, anterior tibial, and posterior tibial artery with vein patch angioplasty of the left popliteal artery.  Following the procedure, she had a palpable posterior tibial pulse.  She underwent a full hypercoagulable work-up including CT angiogram of the chest abdomen pelvis and echocardiogram, all of which were unremarkable.  She was sent home on Eliquis which she has been taking.  She states that about a week ago she felt a pop behind her knee, she went to see her doctor who said everything was okay.  Last night, she developed pain in her foot and went to the [facility] emergency department.  She was unable to wiggle her toes and has pain in her entire leg.  Her sensation is also decreased.  She no longer has a palpable pulse.  She was transferred to [facility]  PAST MEDICAL HISTORY    No past medical history on file.   FAMILY HISTORY   No family history on file.  SOCIAL HISTORY:   Social History   Socioeconomic History  . Marital status: Married    Spouse name: Not on file  . Number of children: Not on file  . Years of education: Not on file  . Highest education level: Not on file  Occupational History  . Not on file  Social Needs  . Financial resource strain: Not on file  . Food insecurity:    Worry: Not on file    Inability: Not on file  . Transportation needs:    Medical: Not on file    Non-medical: Not on file  Tobacco Use  . Smoking status: Current Every Day Smoker    Types: Cigarettes  . Smokeless tobacco: Never Used  Substance and Sexual Activity  . Alcohol use:  Not on file  . Drug use: Not on file  . Sexual activity: Not on file  Lifestyle  . Physical activity:    Days per week: Not on file    Minutes per session: Not on file  . Stress: Not on file  Relationships  . Social connections:    Talks on phone: Not on file    Gets together: Not on file    Attends religious service: Not on file    Active member of club or organization: Not on file    Attends meetings of clubs or organizations: Not on file    Relationship status: Not on file  . Intimate partner violence:    Fear of current or ex partner: Not on file    Emotionally abused: Not on file    Physically abused: Not on file    Forced sexual activity: Not on file  Other Topics Concern  . Not on file  Social History Narrative  . Not on file    ALLERGIES:    Allergies  Allergen Reactions  . Hydrocodone Bitartrate Er Hives    CURRENT MEDICATIONS:    No current facility-administered medications for this encounter.     REVIEW OF SYSTEMS:   [X]  denotes positive finding, [ ]  denotes negative finding Cardiac  Comments:  Chest pain or chest pressure:    Shortness of breath upon exertion:  Short of breath when lying flat:    Irregular heart rhythm:        Vascular    Pain in calf, thigh, or hip brought on by ambulation: x   Pain in feet at night that wakes you up from your sleep:  x   Blood clot in your veins:    Leg swelling:         Pulmonary    Oxygen at home:    Productive cough:     Wheezing:         Neurologic    Sudden weakness in arms or legs:     Sudden numbness in arms or legs:     Sudden onset of difficulty speaking or slurred speech:    Temporary loss of vision in one eye:     Problems with dizziness:         Gastrointestinal    Blood in stool:      Vomited blood:         Genitourinary    Burning when urinating:     Blood in urine:        Psychiatric    Major depression:         Hematologic    Bleeding problems:    Problems with blood  clotting too easily:        Skin    Rashes or ulcers:        Constitutional    Fever or chills:     PHYSICAL EXAM:   There were no vitals filed for this visit.  GENERAL: The patient is a well-nourished female, in no acute distress. The vital signs are documented above. CARDIAC: There is a regular rate and rhythm.  VASCULAR: Nonpalpable pedal pulse PULMONARY: Nonlabored respirations ABDOMEN: Soft and non-tender with normal pitched bowel sounds.  MUSCULOSKELETAL: There are no major deformities or cyanosis. NEUROLOGIC: Minimal movement of her toes and no sensation. SKIN: Normal color to the left leg PSYCHIATRIC: The patient has a normal affect.  STUDIES:   None  ASSESSMENT and PLAN   Ischemic left leg: The patient appears to have reoccluded her popliteal artery.  She does have motor and sensory deficits.  In addition to Eliquis, she was started on heparin drip and [facility].  She will be taken to the operating room for emergent revascularization.  I discussed that we would proceed with embolectomy and then perform formal angiography to determine if any additional procedures would be indicated, including the possibility of bypass.  We also discussed the possibility of fasciotomies.  All of her questions were answered.   Charlena CrossWells Mossie Gilder, IV, MD, FACS [facility] Tel [phone] Pager [phone]

## 2018-12-01 NOTE — Anesthesia Procedure Notes (Addendum)
Procedure Name: Intubation Date/Time: 12/01/2018 10:40 PM Performed by: Sonda Primes, CRNA Pre-anesthesia Checklist: Patient identified, Emergency Drugs available, Suction available and Patient being monitored Patient Re-evaluated:Patient Re-evaluated prior to induction Oxygen Delivery Method: Circle System Utilized Preoxygenation: Pre-oxygenation with 100% oxygen Induction Type: IV induction Ventilation: Mask ventilation without difficulty Laryngoscope Size: Glidescope and 3 Grade View: Grade III Tube type: Oral Tube size: 7.0 mm Number of attempts: 1 Airway Equipment and Method: Stylet and Oral airway Placement Confirmation: ETT inserted through vocal cords under direct vision,  positive ETCO2 and breath sounds checked- equal and bilateral Secured at: 24 cm Tube secured with: Tape Dental Injury: Teeth and Oropharynx as per pre-operative assessment

## 2018-12-01 NOTE — Anesthesia Procedure Notes (Signed)
Arterial Line Insertion Start/End5/03/2019 10:38 PM, 12/01/2018 10:41 PM Performed by: Zollie Scale, CRNA, CRNA  Patient location: OR. Preanesthetic checklist: site marked, monitors and equipment checked and anesthesia consent Emergency situation Left, radial was placed Catheter size: 20 G Hand hygiene performed  and Seldinger technique used Allen's test indicative of satisfactory collateral circulation Attempts: 1 Procedure performed without using ultrasound guided technique. Following insertion, dressing applied and Biopatch. Post procedure assessment: normal  Patient tolerated the procedure well with no immediate complications.

## 2018-12-01 NOTE — Anesthesia Preprocedure Evaluation (Addendum)
Anesthesia Evaluation  Patient identified by MRN, date of birth, ID band Patient awake    Reviewed: Allergy & Precautions, H&P , NPO status , Patient's Chart, lab work & pertinent test results  Airway Mallampati: II  TM Distance: >3 FB Neck ROM: Full    Dental no notable dental hx. (+) Teeth Intact, Dental Advisory Given   Pulmonary Current Smoker,    Pulmonary exam normal breath sounds clear to auscultation       Cardiovascular + Peripheral Vascular Disease   Rhythm:Regular Rate:Normal     Neuro/Psych negative neurological ROS  negative psych ROS   GI/Hepatic negative GI ROS, Neg liver ROS,   Endo/Other  negative endocrine ROS  Renal/GU negative Renal ROS  negative genitourinary   Musculoskeletal   Abdominal   Peds  Hematology negative hematology ROS (+)   Anesthesia Other Findings   Reproductive/Obstetrics negative OB ROS                            Anesthesia Physical Anesthesia Plan  ASA: III and emergent  Anesthesia Plan: General   Post-op Pain Management:    Induction: Intravenous, Rapid sequence and Cricoid pressure planned  PONV Risk Score and Plan: 3 and Ondansetron, Dexamethasone and Midazolam  Airway Management Planned: Oral ETT  Additional Equipment:   Intra-op Plan:   Post-operative Plan: Extubation in OR  Informed Consent: I have reviewed the patients History and Physical, chart, labs and discussed the procedure including the risks, benefits and alternatives for the proposed anesthesia with the patient or authorized representative who has indicated his/her understanding and acceptance.     Dental advisory given  Plan Discussed with: CRNA  Anesthesia Plan Comments:         Anesthesia Quick Evaluation

## 2018-12-01 NOTE — Progress Notes (Signed)
COVId-19 rapid screen obtained in OR at 2300- delivered to lab in person

## 2018-12-02 ENCOUNTER — Other Ambulatory Visit (HOSPITAL_COMMUNITY): Payer: Self-pay | Admitting: *Deleted

## 2018-12-02 ENCOUNTER — Inpatient Hospital Stay (HOSPITAL_COMMUNITY): Payer: BLUE CROSS/BLUE SHIELD | Admitting: Anesthesiology

## 2018-12-02 ENCOUNTER — Encounter (HOSPITAL_COMMUNITY)
Admission: EM | Disposition: A | Discharge status: Rehabilitation Facility | Payer: Self-pay | Source: Other Acute Inpatient Hospital | Attending: Surgery

## 2018-12-02 DIAGNOSIS — I743 Embolism and thrombosis of arteries of the lower extremities: Secondary | ICD-10-CM

## 2018-12-02 DIAGNOSIS — Z89512 Acquired absence of left leg below knee: Secondary | ICD-10-CM

## 2018-12-02 DIAGNOSIS — I70229 Atherosclerosis of native arteries of extremities with rest pain, unspecified extremity: Secondary | ICD-10-CM | POA: Diagnosis present

## 2018-12-02 DIAGNOSIS — I998 Other disorder of circulatory system: Secondary | ICD-10-CM

## 2018-12-02 HISTORY — PX: THROMBECTOMY FEMORAL ARTERY: SHX6406

## 2018-12-02 HISTORY — PX: LOWER EXTREMITY ANGIOGRAM: SHX5508

## 2018-12-02 LAB — CBC
HCT: 31.3 % — ABNORMAL LOW (ref 36.0–46.0)
Hemoglobin: 9.6 g/dL — ABNORMAL LOW (ref 12.0–15.0)
MCH: 24.8 pg — ABNORMAL LOW (ref 26.0–34.0)
MCHC: 30.7 g/dL (ref 30.0–36.0)
MCV: 80.9 fL (ref 80.0–100.0)
Platelets: 204 10*3/uL (ref 150–400)
RBC: 3.87 MIL/uL (ref 3.87–5.11)
RDW: 15.7 % — ABNORMAL HIGH (ref 11.5–15.5)
WBC: 13.3 10*3/uL — ABNORMAL HIGH (ref 4.0–10.5)
nRBC: 0 % (ref 0.0–0.2)

## 2018-12-02 LAB — BASIC METABOLIC PANEL
Anion gap: 13 (ref 5–15)
BUN: 9 mg/dL (ref 6–20)
CO2: 23 mmol/L (ref 22–32)
Calcium: 8.3 mg/dL — ABNORMAL LOW (ref 8.9–10.3)
Chloride: 101 mmol/L (ref 98–111)
Creatinine, Ser: 0.9 mg/dL (ref 0.44–1.00)
GFR calc Af Amer: >60 mL/min (ref >60)
GFR calc non Af Amer: >60 mL/min (ref >60)
Glucose, Bld: 173 mg/dL — ABNORMAL HIGH (ref 70–99)
Potassium: 4.2 mmol/L (ref 3.5–5.1)
Sodium: 137 mmol/L (ref 135–145)

## 2018-12-02 LAB — POCT I-STAT 4, (NA,K, GLUC, HGB,HCT)
Glucose, Bld: 112 mg/dL — ABNORMAL HIGH (ref 70–99)
HCT: 27 % — ABNORMAL LOW (ref 36.0–46.0)
Hemoglobin: 9.2 g/dL — ABNORMAL LOW (ref 12.0–15.0)
Potassium: 5 mmol/L (ref 3.5–5.1)
Sodium: 136 mmol/L (ref 135–145)

## 2018-12-02 LAB — SARS CORONAVIRUS 2 BY RT PCR (HOSPITAL ORDER, PERFORMED IN ~~LOC~~ HOSPITAL LAB): SARS Coronavirus 2: NEGATIVE

## 2018-12-02 LAB — APTT: aPTT: 88 seconds — ABNORMAL HIGH (ref 24–36)

## 2018-12-02 SURGERY — THROMBECTOMY, ARTERY, FEMORAL
Anesthesia: General | Laterality: Left

## 2018-12-02 MED ORDER — ALUM & MAG HYDROXIDE-SIMETH 200-200-20 MG/5ML PO SUSP: 15.0000 mL | ORAL | Status: DC | PRN

## 2018-12-02 MED ORDER — SUGAMMADEX SODIUM 200 MG/2ML IV SOLN
INTRAVENOUS | Status: DC | PRN
  Administered 2018-12-02: 164.6 mg via INTRAVENOUS

## 2018-12-02 MED ORDER — ESMOLOL HCL 100 MG/10ML IV SOLN
INTRAVENOUS | Status: AC
  Filled 2018-12-02: qty 10

## 2018-12-02 MED ORDER — KETAMINE HCL 10 MG/ML IJ SOLN
INTRAMUSCULAR | Status: DC | PRN
  Administered 2018-12-02: 30 mg via INTRAVENOUS
  Administered 2018-12-02: 20 mg via INTRAVENOUS

## 2018-12-02 MED ORDER — POTASSIUM CHLORIDE CRYS ER 20 MEQ PO TBCR: 20.0000 meq | EXTENDED_RELEASE_TABLET | Freq: Every day | ORAL | Status: DC | PRN

## 2018-12-02 MED ORDER — ONDANSETRON HCL 4 MG/2ML IJ SOLN: 4.0000 mg | Freq: Four times a day (QID) | INTRAMUSCULAR | Status: DC | PRN

## 2018-12-02 MED ORDER — FENTANYL CITRATE (PF) 250 MCG/5ML IJ SOLN
INTRAMUSCULAR | Status: AC
  Filled 2018-12-02: qty 5

## 2018-12-02 MED ORDER — LIDOCAINE 2% (20 MG/ML) 5 ML SYRINGE
INTRAMUSCULAR | Status: DC | PRN
  Administered 2018-12-02: 60 mg via INTRAVENOUS

## 2018-12-02 MED ORDER — SODIUM CHLORIDE 0.9 % IV SOLN
INTRAVENOUS | Status: DC | PRN
  Administered 2018-12-02: 25 ug/min via INTRAVENOUS

## 2018-12-02 MED ORDER — SODIUM CHLORIDE 0.9 % IV SOLN
INTRAVENOUS | Status: DC
  Administered 2018-12-02 – 2018-12-03 (×3): via INTRAVENOUS

## 2018-12-02 MED ORDER — PROPOFOL 10 MG/ML IV BOLUS
INTRAVENOUS | Status: AC
  Filled 2018-12-02: qty 20

## 2018-12-02 MED ORDER — SODIUM CHLORIDE 0.9 % IV SOLN: 500.0000 mL | Freq: Once | INTRAVENOUS | Status: DC | PRN

## 2018-12-02 MED ORDER — PHENYLEPHRINE HCL (PRESSORS) 10 MG/ML IV SOLN
INTRAVENOUS | Status: DC | PRN
  Administered 2018-12-02: 80 ug via INTRAVENOUS

## 2018-12-02 MED ORDER — PROTAMINE SULFATE 10 MG/ML IV SOLN
INTRAVENOUS | Status: DC | PRN
  Administered 2018-12-02: 50 mg via INTRAVENOUS

## 2018-12-02 MED ORDER — LIDOCAINE 2% (20 MG/ML) 5 ML SYRINGE
INTRAMUSCULAR | Status: AC
  Filled 2018-12-02: qty 5

## 2018-12-02 MED ORDER — DEXAMETHASONE SODIUM PHOSPHATE 10 MG/ML IJ SOLN
INTRAMUSCULAR | Status: AC
  Filled 2018-12-02: qty 1

## 2018-12-02 MED ORDER — CEFAZOLIN SODIUM-DEXTROSE 2-4 GM/100ML-% IV SOLN
2.0000 g | Freq: Three times a day (TID) | INTRAVENOUS | Status: AC
  Administered 2018-12-02 (×2): 2 g via INTRAVENOUS
  Filled 2018-12-02 (×3): qty 100

## 2018-12-02 MED ORDER — ACETAMINOPHEN 500 MG PO TABS: 1000.0000 mg | ORAL_TABLET | Freq: Once | ORAL | Status: DC | PRN

## 2018-12-02 MED ORDER — HEMOSTATIC AGENTS (NO CHARGE) OPTIME
TOPICAL | Status: DC | PRN
  Administered 2018-12-02: 1 via TOPICAL

## 2018-12-02 MED ORDER — HYDRALAZINE HCL 20 MG/ML IJ SOLN
5.0000 mg | INTRAMUSCULAR | Status: DC | PRN
  Filled 2018-12-02: qty 1

## 2018-12-02 MED ORDER — HEMOSTATIC AGENTS (NO CHARGE) OPTIME
TOPICAL | Status: DC | PRN
  Administered 2018-12-02 (×2): 1 via TOPICAL

## 2018-12-02 MED ORDER — HEPARIN (PORCINE) 25000 UT/250ML-% IV SOLN
800.0000 [IU]/h | INTRAVENOUS | Status: DC
  Filled 2018-12-02: qty 250

## 2018-12-02 MED ORDER — SODIUM CHLORIDE 0.9 % IV SOLN
INTRAVENOUS | Status: AC
  Filled 2018-12-02: qty 1.2

## 2018-12-02 MED ORDER — ACETAMINOPHEN 10 MG/ML IV SOLN: 1000.0000 mg | Freq: Once | INTRAVENOUS | Status: DC | PRN

## 2018-12-02 MED ORDER — OXYCODONE HCL 5 MG PO TABS: 5.0000 mg | ORAL_TABLET | Freq: Once | ORAL | Status: DC | PRN

## 2018-12-02 MED ORDER — 0.9 % SODIUM CHLORIDE (POUR BTL) OPTIME
TOPICAL | Status: DC | PRN
  Administered 2018-12-02: 2000 mL

## 2018-12-02 MED ORDER — MORPHINE SULFATE (PF) 2 MG/ML IV SOLN
2.0000 mg | INTRAVENOUS | Status: DC | PRN
  Administered 2018-12-02 – 2018-12-03 (×5): 4 mg via INTRAVENOUS
  Filled 2018-12-02 (×5): qty 2

## 2018-12-02 MED ORDER — SUCCINYLCHOLINE CHLORIDE 200 MG/10ML IV SOSY
PREFILLED_SYRINGE | INTRAVENOUS | Status: DC | PRN
  Administered 2018-12-02: 80 mg via INTRAVENOUS

## 2018-12-02 MED ORDER — DIPHENHYDRAMINE HCL 50 MG/ML IJ SOLN
INTRAMUSCULAR | Status: AC
  Filled 2018-12-02: qty 1

## 2018-12-02 MED ORDER — ONDANSETRON HCL 4 MG/2ML IJ SOLN
INTRAMUSCULAR | Status: DC | PRN
  Administered 2018-12-02: 4 mg via INTRAVENOUS

## 2018-12-02 MED ORDER — HEPARIN SODIUM (PORCINE) 1000 UNIT/ML IJ SOLN
INTRAMUSCULAR | Status: DC | PRN
  Administered 2018-12-01: 8000 [IU] via INTRAVENOUS
  Administered 2018-12-02: 3000 [IU] via INTRAVENOUS

## 2018-12-02 MED ORDER — FENTANYL CITRATE (PF) 100 MCG/2ML IJ SOLN: 25.0000 ug | INTRAMUSCULAR | Status: DC | PRN

## 2018-12-02 MED ORDER — MAGNESIUM SULFATE 2 GM/50ML IV SOLN: 2.0000 g | Freq: Every day | INTRAVENOUS | Status: DC | PRN

## 2018-12-02 MED ORDER — PHENOL 1.4 % MT LIQD: 1.0000 | OROMUCOSAL | Status: DC | PRN

## 2018-12-02 MED ORDER — ACETAMINOPHEN 325 MG PO TABS
325.0000 mg | ORAL_TABLET | ORAL | Status: DC | PRN
  Administered 2018-12-07: 650 mg via ORAL
  Filled 2018-12-02: qty 2

## 2018-12-02 MED ORDER — HEPARIN SODIUM (PORCINE) 1000 UNIT/ML IJ SOLN
INTRAMUSCULAR | Status: AC
  Filled 2018-12-02: qty 1

## 2018-12-02 MED ORDER — METOPROLOL TARTRATE 5 MG/5ML IV SOLN: 2.0000 mg | INTRAVENOUS | Status: DC | PRN

## 2018-12-02 MED ORDER — STERILE WATER FOR INJECTION IV SOLN
Freq: Once | INTRAVENOUS | Status: AC
  Administered 2018-12-02: 12:00:00 via INTRAVENOUS
  Filled 2018-12-02: qty 20

## 2018-12-02 MED ORDER — ONDANSETRON HCL 4 MG/2ML IJ SOLN
INTRAMUSCULAR | Status: AC
  Filled 2018-12-02: qty 2

## 2018-12-02 MED ORDER — SUCCINYLCHOLINE CHLORIDE 200 MG/10ML IV SOSY
PREFILLED_SYRINGE | INTRAVENOUS | Status: AC
  Filled 2018-12-02: qty 10

## 2018-12-02 MED ORDER — GUAIFENESIN-DM 100-10 MG/5ML PO SYRP: 15.0000 mL | ORAL_SOLUTION | ORAL | Status: DC | PRN

## 2018-12-02 MED ORDER — DEXMEDETOMIDINE HCL 200 MCG/2ML IV SOLN
INTRAVENOUS | Status: DC | PRN
  Administered 2018-12-02 (×3): 4 ug via INTRAVENOUS

## 2018-12-02 MED ORDER — LABETALOL HCL 5 MG/ML IV SOLN
10.0000 mg | INTRAVENOUS | Status: AC | PRN
  Administered 2018-12-02 – 2018-12-05 (×4): 10 mg via INTRAVENOUS
  Filled 2018-12-02 (×3): qty 4

## 2018-12-02 MED ORDER — ROCURONIUM BROMIDE 10 MG/ML (PF) SYRINGE
PREFILLED_SYRINGE | INTRAVENOUS | Status: AC
  Filled 2018-12-02: qty 10

## 2018-12-02 MED ORDER — PANTOPRAZOLE SODIUM 40 MG PO TBEC
40.0000 mg | DELAYED_RELEASE_TABLET | Freq: Every day | ORAL | Status: DC
  Administered 2018-12-03 – 2018-12-07 (×4): 40 mg via ORAL
  Filled 2018-12-02 (×4): qty 1

## 2018-12-02 MED ORDER — ACETAMINOPHEN 160 MG/5ML PO SOLN: 1000.0000 mg | Freq: Once | ORAL | Status: DC | PRN

## 2018-12-02 MED ORDER — ACETAMINOPHEN 10 MG/ML IV SOLN: 1000.0000 mg | Freq: Four times a day (QID) | INTRAVENOUS | Status: DC

## 2018-12-02 MED ORDER — ALBUMIN HUMAN 5 % IV SOLN
INTRAVENOUS | Status: DC | PRN
  Administered 2018-12-02: 13:00:00 via INTRAVENOUS

## 2018-12-02 MED ORDER — FENTANYL CITRATE (PF) 250 MCG/5ML IJ SOLN
INTRAMUSCULAR | Status: DC | PRN
  Administered 2018-12-02 (×2): 50 ug via INTRAVENOUS
  Administered 2018-12-02: 100 ug via INTRAVENOUS
  Administered 2018-12-02: 50 ug via INTRAVENOUS

## 2018-12-02 MED ORDER — ACETAMINOPHEN 325 MG RE SUPP: 325.0000 mg | RECTAL | Status: DC | PRN

## 2018-12-02 MED ORDER — DEXAMETHASONE SODIUM PHOSPHATE 10 MG/ML IJ SOLN
INTRAMUSCULAR | Status: DC | PRN
  Administered 2018-12-02: 5 mg via INTRAVENOUS

## 2018-12-02 MED ORDER — KETAMINE HCL 50 MG/5ML IJ SOSY
PREFILLED_SYRINGE | INTRAMUSCULAR | Status: AC
  Filled 2018-12-02: qty 10

## 2018-12-02 MED ORDER — ACETAMINOPHEN 10 MG/ML IV SOLN
INTRAVENOUS | Status: AC
  Administered 2018-12-02: 1000 mg via INTRAVENOUS
  Filled 2018-12-02: qty 100

## 2018-12-02 MED ORDER — HYDROMORPHONE HCL 1 MG/ML IJ SOLN
INTRAMUSCULAR | Status: AC
  Filled 2018-12-02: qty 1

## 2018-12-02 MED ORDER — TRAMADOL HCL 50 MG PO TABS
50.0000 mg | ORAL_TABLET | Freq: Four times a day (QID) | ORAL | Status: DC | PRN
  Administered 2018-12-02 – 2018-12-03 (×2): 50 mg via ORAL
  Filled 2018-12-02 (×2): qty 1

## 2018-12-02 MED ORDER — HEPARIN (PORCINE) 25000 UT/250ML-% IV SOLN
1000.0000 [IU]/h | INTRAVENOUS | Status: DC
  Filled 2018-12-02: qty 250

## 2018-12-02 MED ORDER — SODIUM CHLORIDE 0.9 % IV SOLN
INTRAVENOUS | Status: DC | PRN
  Administered 2018-12-02: 500 mL

## 2018-12-02 MED ORDER — HEPARIN SODIUM (PORCINE) 1000 UNIT/ML IJ SOLN
INTRAMUSCULAR | Status: DC | PRN
  Administered 2018-12-02: 3000 [IU] via INTRAVENOUS
  Administered 2018-12-02: 8000 [IU] via INTRAVENOUS

## 2018-12-02 MED ORDER — POLYETHYLENE GLYCOL 3350 17 G PO PACK: 17.0000 g | PACK | Freq: Every day | ORAL | Status: DC | PRN

## 2018-12-02 MED ORDER — SODIUM CHLORIDE (PF) 0.9 % IJ SOLN
INTRAVENOUS | Status: DC | PRN
  Administered 2018-12-02: 12:00:00 100 mL via INTRAMUSCULAR

## 2018-12-02 MED ORDER — ESMOLOL HCL 100 MG/10ML IV SOLN
INTRAVENOUS | Status: DC | PRN
  Administered 2018-12-02 (×2): 10 mg via INTRAVENOUS

## 2018-12-02 MED ORDER — SUGAMMADEX SODIUM 200 MG/2ML IV SOLN
INTRAVENOUS | Status: DC | PRN
  Administered 2018-12-02: 200 mg via INTRAVENOUS

## 2018-12-02 MED ORDER — PROTAMINE SULFATE 10 MG/ML IV SOLN
INTRAVENOUS | Status: AC
  Filled 2018-12-02: qty 5

## 2018-12-02 MED ORDER — PROPOFOL 10 MG/ML IV BOLUS
INTRAVENOUS | Status: DC | PRN
  Administered 2018-12-02: 120 mg via INTRAVENOUS

## 2018-12-02 MED ORDER — MIDAZOLAM HCL 2 MG/2ML IJ SOLN
INTRAMUSCULAR | Status: DC | PRN
  Administered 2018-12-02: 2 mg via INTRAVENOUS

## 2018-12-02 MED ORDER — LACTATED RINGERS IV SOLN
INTRAVENOUS | Status: DC | PRN
  Administered 2018-12-02: 11:00:00 via INTRAVENOUS

## 2018-12-02 MED ORDER — OXYCODONE HCL 5 MG/5ML PO SOLN: 5.0000 mg | Freq: Once | ORAL | Status: DC | PRN

## 2018-12-02 MED ORDER — MIDAZOLAM HCL 2 MG/2ML IJ SOLN
INTRAMUSCULAR | Status: AC
  Filled 2018-12-02: qty 2

## 2018-12-02 MED ORDER — HYDROMORPHONE HCL 1 MG/ML IJ SOLN
0.2500 mg | INTRAMUSCULAR | Status: DC | PRN
  Administered 2018-12-02 (×2): 0.5 mg via INTRAVENOUS

## 2018-12-02 MED ORDER — DOCUSATE SODIUM 100 MG PO CAPS
100.0000 mg | ORAL_CAPSULE | Freq: Every day | ORAL | Status: DC
  Administered 2018-12-03 – 2018-12-07 (×4): 100 mg via ORAL
  Filled 2018-12-02 (×4): qty 1

## 2018-12-02 MED ORDER — DEXMEDETOMIDINE HCL IN NACL 200 MCG/50ML IV SOLN
INTRAVENOUS | Status: AC
  Filled 2018-12-02: qty 50

## 2018-12-02 MED ORDER — PHENYLEPHRINE 40 MCG/ML (10ML) SYRINGE FOR IV PUSH (FOR BLOOD PRESSURE SUPPORT)
PREFILLED_SYRINGE | INTRAVENOUS | Status: AC
  Filled 2018-12-02: qty 10

## 2018-12-02 MED ORDER — ROCURONIUM BROMIDE 50 MG/5ML IV SOSY
PREFILLED_SYRINGE | INTRAVENOUS | Status: DC | PRN
  Administered 2018-12-02: 50 mg via INTRAVENOUS

## 2018-12-02 MED ORDER — DIPHENHYDRAMINE HCL 50 MG/ML IJ SOLN
INTRAMUSCULAR | Status: DC | PRN
  Administered 2018-12-02: 12.5 mg via INTRAVENOUS

## 2018-12-02 SURGICAL SUPPLY — 66 items
BANDAGE ACE 4X5 VEL STRL LF (GAUZE/BANDAGES/DRESSINGS) IMPLANT
BANDAGE ESMARK 6X9 LF (GAUZE/BANDAGES/DRESSINGS) IMPLANT
BNDG ESMARK 6X9 LF (GAUZE/BANDAGES/DRESSINGS)
CANISTER SUCT 3000ML PPV (MISCELLANEOUS) ×4 IMPLANT
CATH EMB 2FR 60CM (CATHETERS) ×4 IMPLANT
CATH EMB 3FR 80CM (CATHETERS) ×4 IMPLANT
CATH EMB 4FR 80CM (CATHETERS) ×4 IMPLANT
CLIP VESOCCLUDE MED 24/CT (CLIP) ×4 IMPLANT
CLIP VESOCCLUDE SM WIDE 24/CT (CLIP) ×4 IMPLANT
COVER DOME SNAP 22 D (MISCELLANEOUS) ×4 IMPLANT
COVER WAND RF STERILE (DRAPES) ×4 IMPLANT
CUFF TOURNIQUET SINGLE 24IN (TOURNIQUET CUFF) IMPLANT
CUFF TOURNIQUET SINGLE 34IN LL (TOURNIQUET CUFF) IMPLANT
CUFF TOURNIQUET SINGLE 44IN (TOURNIQUET CUFF) IMPLANT
DERMABOND ADVANCED (GAUZE/BANDAGES/DRESSINGS) ×2
DERMABOND ADVANCED .7 DNX12 (GAUZE/BANDAGES/DRESSINGS) ×2 IMPLANT
DRAIN CHANNEL 15F RND FF W/TCR (WOUND CARE) IMPLANT
DRAPE HALF SHEET 40X57 (DRAPES) ×4 IMPLANT
DRAPE X-RAY CASS 24X20 (DRAPES) IMPLANT
DRSG COVADERM 4X6 (GAUZE/BANDAGES/DRESSINGS) ×8 IMPLANT
DRSG COVADERM 4X8 (GAUZE/BANDAGES/DRESSINGS) ×8 IMPLANT
ELECT CAUTERY BLADE 6.4 (BLADE) ×4 IMPLANT
ELECT REM PT RETURN 9FT ADLT (ELECTROSURGICAL) ×4
ELECTRODE REM PT RTRN 9FT ADLT (ELECTROSURGICAL) ×2 IMPLANT
EVACUATOR SILICONE 100CC (DRAIN) IMPLANT
GLOVE BIOGEL PI IND STRL 7.5 (GLOVE) ×2 IMPLANT
GLOVE BIOGEL PI INDICATOR 7.5 (GLOVE) ×2
GLOVE SURG SS PI 7.5 STRL IVOR (GLOVE) ×4 IMPLANT
GOWN STRL REUS W/ TWL LRG LVL3 (GOWN DISPOSABLE) ×4 IMPLANT
GOWN STRL REUS W/ TWL XL LVL3 (GOWN DISPOSABLE) ×4 IMPLANT
GOWN STRL REUS W/TWL LRG LVL3 (GOWN DISPOSABLE) ×4
GOWN STRL REUS W/TWL XL LVL3 (GOWN DISPOSABLE) ×4
HEMOSTAT SNOW SURGICEL 2X4 (HEMOSTASIS) ×4 IMPLANT
KIT BASIN OR (CUSTOM PROCEDURE TRAY) ×4 IMPLANT
KIT REMOVER STAPLE SKIN (MISCELLANEOUS) ×4 IMPLANT
KIT TURNOVER KIT B (KITS) ×4 IMPLANT
MARKER GRAFT CORONARY BYPASS (MISCELLANEOUS) IMPLANT
NS IRRIG 1000ML POUR BTL (IV SOLUTION) ×8 IMPLANT
PACK PERIPHERAL VASCULAR (CUSTOM PROCEDURE TRAY) ×4 IMPLANT
PAD ARMBOARD 7.5X6 YLW CONV (MISCELLANEOUS) ×8 IMPLANT
POWDER SURGICEL 3.0 GRAM (HEMOSTASIS) ×4 IMPLANT
SET COLLECT BLD 21X3/4 12 (NEEDLE) IMPLANT
SET MICROPUNCTURE 5F STIFF (MISCELLANEOUS) ×4 IMPLANT
STAPLER VISISTAT 35W (STAPLE) ×4 IMPLANT
STOPCOCK 4 WAY LG BORE MALE ST (IV SETS) IMPLANT
STOPCOCK MORSE 400PSI 3WAY (MISCELLANEOUS) ×4 IMPLANT
SUT ETHILON 3 0 PS 1 (SUTURE) IMPLANT
SUT PROLENE 5 0 C 1 24 (SUTURE) ×4 IMPLANT
SUT PROLENE 6 0 BV (SUTURE) ×8 IMPLANT
SUT PROLENE 7 0 BV 1 (SUTURE) ×4 IMPLANT
SUT PROLENE 7 0 BV1 MDA (SUTURE) ×4 IMPLANT
SUT SILK 2 0 SH (SUTURE) ×4 IMPLANT
SUT SILK 3 0 (SUTURE)
SUT SILK 3-0 18XBRD TIE 12 (SUTURE) IMPLANT
SUT VIC AB 2-0 CT1 27 (SUTURE) ×6
SUT VIC AB 2-0 CT1 TAPERPNT 27 (SUTURE) ×6 IMPLANT
SUT VIC AB 3-0 SH 27 (SUTURE) ×2
SUT VIC AB 3-0 SH 27X BRD (SUTURE) ×2 IMPLANT
SUT VICRYL 4-0 PS2 18IN ABS (SUTURE) ×8 IMPLANT
SYRINGE 3CC LL L/F (MISCELLANEOUS) ×8 IMPLANT
TOWEL GREEN STERILE (TOWEL DISPOSABLE) ×4 IMPLANT
TRAY FOLEY MTR SLVR 16FR STAT (SET/KITS/TRAYS/PACK) ×4 IMPLANT
TUBING CIL FLEX 10 FLL-RA (TUBING) ×4 IMPLANT
TUBING EXTENTION W/L.L. (IV SETS) IMPLANT
UNDERPAD 30X30 (UNDERPADS AND DIAPERS) ×4 IMPLANT
WATER STERILE IRR 1000ML POUR (IV SOLUTION) ×4 IMPLANT

## 2018-12-02 NOTE — Progress Notes (Signed)
Patient arrived to PACU, site assessed noted copious bleeding and absent DP pulse and PT pulse found with doppler, Brabham MD was notified. Moser MD also notified and was at the bedside. Manual pressure was applied to site per verbal order. Applied pressure was affective and bleeding has stopped. Brabham MD was notified and gave verbal order to reinforce pressure dressing and to hold heparin. Guaze, abdominal pad, and kerlex pressure dressing applied. Patient transported back to room with dressing site still clean, dry, and intact.

## 2018-12-02 NOTE — Anesthesia Procedure Notes (Signed)
Procedure Name: Intubation Date/Time: 12/02/2018 11:17 AM Performed by: Modena Morrow, CRNA Pre-anesthesia Checklist: Patient identified, Emergency Drugs available, Suction available, Patient being monitored and Timeout performed Patient Re-evaluated:Patient Re-evaluated prior to induction Oxygen Delivery Method: Circle system utilized Preoxygenation: Pre-oxygenation with 100% oxygen Induction Type: IV induction and Rapid sequence Laryngoscope Size: Glidescope and 4 Grade View: Grade I Tube type: Oral Number of attempts: 1 Airway Equipment and Method: Stylet Placement Confirmation: ETT inserted through vocal cords under direct vision,  positive ETCO2 and breath sounds checked- equal and bilateral Secured at: 22 cm Tube secured with: Tape Dental Injury: Teeth and Oropharynx as per pre-operative assessment

## 2018-12-02 NOTE — Op Note (Signed)
Patient name: Debra Ball MRN: [id number] DOB: May 14, 1976 Sex: female  12/01/2018 - 12/02/2018 Pre-operative Diagnosis: ischemic left leg Post-operative diagnosis:  Same Surgeon:  Durene Cal Assistants:  Aggie Moats Procedure: #1: Redo left below-knee popliteal artery exposure   #2: Left posterior tibial artery exposure at the ankle   #3: Thrombectomy of the left superficial femoral, popliteal, anterior tibial, and posterior tibial artery   #4: Bovine pericardial patch angioplasty, left tibioperoneal trunk   #5: Anterior, lateral, and superficial posterior fasciotomy   #6: Ultrasound-guided access, right femoral artery   #7: Abdominal aortogram   #8: Second-order catheterization and left leg runoff   #9: Closure device (Mynx) Anesthesia: General Blood Loss: 300 cc.  2 units of packed red blood cells were transfused  Specimens: Popliteal thromboembolus to pathology  Findings: Popliteal artery was again occluded.  Initial pass proximally revealed a gelatinous type material that was removed.  I was not satisfied with the ability to thrombectomize the posterior tibial artery and so a incision was made behind the ankle to better address the posterior tibial artery.  A bovine pericardial patch was placed on the tibioperoneal trunk.  A separate lateral incision was made for fasciotomy of the anterior and lateral compartments.  I had anesthesia perform a transesophageal echo which was unremarkable.  Indications: Approximately 1 month ago the patient presented with left leg pain and was found to have a popliteal occlusion.  She went to the operating room for thrombectomy and vein patch angioplasty of the posterior tibial artery below the knee.  She underwent a complete embolic work-up including CT angiogram of the chest abdomen pelvis as well as transthoracic echo which was unremarkable.  She was sent home on Eliquis.  She re-presented to [facility] tonight with a 24-hour history  of left leg pain.  She has very poor motor and sensory function.  She was taken emergently to the operating room  Procedure:  The patient was identified in the holding area and taken to [facility] OR ROOM 16  The patient was then placed supine on the table. general anesthesia was administered.  The patient was prepped and draped in the usual sterile fashion.  A time out was called and antibiotics were administered.  The patient's previous medial below-knee incision was opened with a 10 blade.  Using a combination of blunt and sharp dissection I exposed the below-knee popliteal artery, anterior tibial artery and tibioperoneal trunk.  The patient was fully heparinized.  I then made a longitudinal arteriotomy in the previous vein patch.  There was thrombus at this level.  I initially passed a #4 Fogarty catheter proximally.  I evacuated some gelatinous material that did not look like acute or subacute blood clot.  Multiple passes were made until there was good inflow.  The popliteal artery was then occluded and then I used a #3 and #4 Fogarty catheter to perform thrombectomy of the anterior tibial artery.  There was acute thrombus at this level.  Multiple passes were made until there was a negative pass and good backbleeding.  I then passed a #4 #3 Fogarty catheter down the posterior tibial artery.  This went all the way down to the ankle.  After multiple passes I was unable to get any backbleeding.  At this point I elected to expose the posterior tibial artery behind the ankle through a longitudinal incision.  Once this was exposed I made a transverse arteriotomy with a #11 blade.  I passed a #3 Fogarty proximally  and evacuated more thrombus after several passes I was able to get some backbleeding.  I then passed a #3 and #2 Fogarty catheter out onto the foot and again evacuated more thrombus.  This was done until there was a negative pass.  I then turned my attention towards the below-knee artery again.  I inspected the  artery.  There looked like a little area of intima that was raised and so I tacked this down with two 6-0 Prolene sutures.  I then closed the vein patch with a running 6-0 Prolene in longitudinal fashion.  There was good inflow at this point and there was pulsatile bleeding from the posterior tibial artery.  I then closed the arteriotomy in the posterior tibial artery transversely with 2 interrupted 6-0 Prolene sutures.  At this point I did not have a Doppler signal in the anterior tibial artery but there was a palpable pulse in the posterior tibial artery.  I performed angiography at this time.  The right femoral artery was cannulated under ultrasound guidance with a micropuncture needle.  An 018 wire was advanced without resistance and a micropuncture sheath was placed.  A Bentson wire was then inserted and a 5 French sheath was placed.  An Omni Flush catheter was positioned at the level of L1 and abdominal aortogram was performed which showed widely patent renal arteries, infrarenal aorta and bilateral common and external iliac arteries.  There were no filling defects.  I then used the Omni Flush catheter and Bentson wire to cross the aortic bifurcation.  The catheter was placed into the left external iliac artery and left leg runoff was performed.  The superficial femoral artery was occluded at its origin.  Additional imaging of the leg showed that there was reconstitution of the superficial femoral and popliteal artery.  There was a stenosis just distal to the previous patch repair.  Tibial vessels were somewhat limited in evaluation.  I then remove the Omni Flush catheter and flushed the sheath and turned my attention back towards the patch repair.  This time I elected to open the tibioperoneal trunk up into the vein patch.  There was some intima at the distal patch which I removed.  I then passed a #4 Fogarty catheter again and perform thromboembolectomy.  This time additional gelatinous material was  removed.  After this came out there was significantly improved blood flow into the popliteal artery.  A negative pass was then performed.  I also passed the #3 Fogarty catheter down the anterior tibial posterior tibial artery again with no further thrombus evacuated.  I selected a bovine pericardial patch and perform patch angioplasty of the tibioperoneal trunk extending up to the vein patch.  Once this was completed blood flow was reestablished to the left leg.  She now had a palpable posterior tibial and anterior tibial pulse.  I then reinserted the Omni Flush catheter and cross the aortic bifurcation and place it at the external iliac artery and perform runoff.  Now, the superficial femoral and profundofemoral artery are widely patent as well as the popliteal artery tibioperoneal trunk.  The anterior tibial and posterior tibial artery fill proximally.  Distal images were suboptimal.  Since the patient had a palpable anterior tibial and posterior tibial pulse, I was satisfied with these results.  The Omni Flush catheter and Bentson wire were then removed.  Because of the duration of ischemia and her presenting symptoms I elected to make a lateral incision in the calf.  Through this incision  I perform fasciotomies of the anterior and lateral compartments.  The muscle was viable.  Because the patient remained very oozy I elected to reverse the heparin with 50 mg of protamine.  I closed the skin over the lateral fasciotomy incision with a running 3-0 nylon suture.  The posterior tibial artery exposure incision was closed by reapproximating the subcutaneous tissue with 3-0 Vicryl and interrupted nylon sutures were placed to close the skin.  The medial below-knee incision was closed by reapproximating the subcutaneous tissue with 2-0 Vicryl.  The skin was closed with staples.  At this point, a minx device was used to close the right femoral artery cannulation site.  I asked anesthesia to perform a transesophageal  echo.  There were no intracardiac defects visualized.  Sterile dressings were then applied.  The patient was successfully extubated and taken to recovery in stable condition.   Disposition: To PACU stable.   Juleen ChinaV. Wells Najat Olazabal, M.D., [facility] [place] Office: [phone] Pager:  [phone]

## 2018-12-02 NOTE — Anesthesia Preprocedure Evaluation (Addendum)
Anesthesia Evaluation  Patient identified by MRN, date of birth, ID band Patient awake    Reviewed: Allergy & Precautions, H&P , NPO status , Patient's Chart, lab work & pertinent test results  Airway Mallampati: II  TM Distance: >3 FB Neck ROM: Full    Dental no notable dental hx. (+) Teeth Intact, Dental Advisory Given   Pulmonary Current Smoker,    Pulmonary exam normal breath sounds clear to auscultation       Cardiovascular hypertension, + Peripheral Vascular Disease   Rhythm:Regular Rate:Normal     Neuro/Psych negative neurological ROS  negative psych ROS   GI/Hepatic negative GI ROS, Neg liver ROS,   Endo/Other  negative endocrine ROS  Renal/GU negative Renal ROS  negative genitourinary   Musculoskeletal   Abdominal   Peds  Hematology negative hematology ROS (+)   Anesthesia Other Findings   Reproductive/Obstetrics negative OB ROS                            Anesthesia Physical Anesthesia Plan  ASA: III  Anesthesia Plan: General   Post-op Pain Management:    Induction: Intravenous  PONV Risk Score and Plan: 2 and Ondansetron and Dexamethasone  Airway Management Planned: Oral ETT  Additional Equipment: None  Intra-op Plan:   Post-operative Plan: Extubation in OR  Informed Consent: I have reviewed the patients History and Physical, chart, labs and discussed the procedure including the risks, benefits and alternatives for the proposed anesthesia with the patient or authorized representative who has indicated his/her understanding and acceptance.     Dental advisory given  Plan Discussed with: CRNA and Surgeon  Anesthesia Plan Comments:         Anesthesia Quick Evaluation

## 2018-12-02 NOTE — Transfer of Care (Signed)
Immediate Anesthesia Transfer of Care Note  Patient: Debra Ball  Procedure(s) Performed: Thrombectomy left leg, SFA, Popliteal artery, posterior tibial artery, and anterior itbial artery, Redo popliteal exposure, posterior tibia exposure (Left ) Patch Angioplasty with xenosure Biological patch on Left tibia fibula trunk (Left ) Abdominal Aortogram with left leg runoff (Left ) Transesophageal Echocardiogram [name])  Patient Location: PACU  Anesthesia Type:General  Level of Consciousness: awake  Airway & Oxygen Therapy: Patient Spontanous Breathing and Patient connected to face mask oxygen  Post-op Assessment: Report given to RN and Post -op Vital signs reviewed and stable  Post vital signs: Reviewed and stable  Last Vitals:  Vitals Value Taken Time  BP 157/99 12/02/2018  3:18 AM  Temp    Pulse 108 12/02/2018  3:18 AM  Resp 14 12/02/2018  3:18 AM  SpO2 100 % 12/02/2018  3:18 AM  Vitals shown include unvalidated device data.  Last Pain: There were no vitals filed for this visit.       Complications: No apparent anesthesia complications

## 2018-12-02 NOTE — Progress Notes (Signed)
I received a phone call from the PACU stating that the patient is having a fair amount of oozing from her below-knee incision.  Because of this I am not restarting her heparin.  This is not surprising given that she is still on Eliquis and I did not reverse her heparin.  I will try to reinforce the incision and hopefully this will stop.  There is also concern about decreased pulses.  The patient does not have any further options for revascularization and so I will not take her back to the operating room for exploration.  I had another extensive conversation with her husband.  I think that she is very high risk for amputation.  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  Durene Cal

## 2018-12-02 NOTE — Progress Notes (Signed)
PT Cancellation Note  Patient Details Name: Debra Ball MRN: [id number] DOB: Sep 23, 1975   Cancelled Treatment:    Reason Eval/Treat Not Completed: Patient at procedure or test/unavailable (back to OR).  Laurina Bustle, PT, DPT Acute Rehabilitation Services Pager [phone] Office [phone]    Vanetta Mulders 12/02/2018, 11:05 AM

## 2018-12-02 NOTE — Progress Notes (Signed)
Pt c/o increased pain in L foot/toes.  L anterior tibial pulse doppler +2 above ankle.

## 2018-12-02 NOTE — Op Note (Signed)
Patient name: Debra Ball MRN: [id number] DOB: [birth year] Sex: female  12/02/2018 Pre-operative Diagnosis: ischemic left leg Post-operative diagnosis:  Same Surgeon:  Durene Cal Assistants:  Aggie Moats Procedure:   #1: Redo left below-knee popliteal artery exposure   #2: Open exposure of left anterior tibial artery   #3: Redo exposure of left posterior tibial artery   #4: Thrombectomy of left superficial femoral, popliteal, anterior tibial, posterior tibial artery   #5: Left lower extremity arteriogram   #6: Intra-arterial administration of TPA into the posterior tibial and anterior tibial artery Anesthesia: General Blood Loss: 150 cc Specimens: None  Findings: 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  Selective angiography add onto the foot identified that there is minimal runoff onto the foot which is likely the source of recurrent thrombosis.  No further options for revascularization  Indications: The patient underwent thrombectomy of the left leg yesterday for recurrent thrombosis.  After thrombectomy, I performed bovine pericardial patch angioplasty to the tibioperoneal trunk.  I had also exposed the posterior tibial artery, and perform fasciotomies.  She lost her pulse this morning, and so she was taken back to the operating room.  Procedure:  The patient was identified in the holding area and taken to [facility] OR ROOM 16  The patient was then placed supine on the table. general anesthesia was administered.  The patient was prepped and draped in the usual sterile fashion.  A time out was called and antibiotics were administered.  The patient's previous medial below-knee incision was open by removing the staples and sutures.  I exposed the popliteal, anterior tibial and tibioperoneal trunk.  There was no pulse within the popliteal artery.  The patient was bolused with heparin.  I performed a arteriotomy longitudinally within the bovine pericardial patch on the  tibioperoneal trunk.  There was acute thrombus present.  I passed a #4 Fogarty catheter proximally and evacuated acute thrombus.  This was done several times until a negative pass was performed and there was excellent inflow.  The popliteal artery was then occluded.  I then inserted a #4 Fogarty catheter into the anterior tibial artery and perform thrombectomy.  I was able to get the catheter down to the ankle.  Again acute thrombus was evacuated.  Several passes were performed until there was no further thrombus evacuated and there was good backbleeding.  I then administered 10 mg of TPA directly into the anterior tibial artery.  The anterior tibial artery was occluded and then I performed thrombectomy of the posterior tibial artery.  I could get the catheter down the ankle but did not get any backbleeding and so I reopen the incision behind the ankle to expose the posterior tibial artery.  I reopened the transverse arteriotomy.  I was able to pass a #3 Fogarty catheter up to the popliteal artery.  Multiple passes were made and all of the thrombus was evacuated.  I then passed a #3 #2 Fogarty catheter out onto the ankle.  I could not get this to go past about 8 cm.  I was pretty forceful with the Fogarty catheter but could not get it to advance.  After all the thrombus was evacuated, I instilled 5 mg of TPA into the posterior tibial artery out onto the foot.  I then closed the transverse arteriotomy in the posterior tibial arter with 7-0 Prolene.  I then placed a Angiocath into the tibioperoneal trunk and administered another 5 mg of TPA into the posterior tibial artery.  I left the TPA sit for 10 to 15 minutes.  I then inserted a micropuncture sheath directly into the anterior tibial artery and perform selective angiography of the anterior tibial artery.  I did not see any contrast opacify the artery past the mid calf.  I then inserted the micropuncture sheath into the posterior tibial artery and perform  angiography of the posterior tibial artery.  This shows a cut off of the posterior tibial artery just beyond the ankle.  This was a similar finding to her angiogram prior to any intervention 1 month ago.  At this point, I closed the bovine pericardial patch with 6-0 Prolene.  The clamps were released.  At this time I had a palpable pulse within the anterior tibial and posterior tibial artery.  There was an obstructive Doppler signal.  Because of the angiographic findings with selective imaging of the anterior tibial artery, I elected to expose the anterior tibial artery at the ankle.  This was done by making a longitudinal incision.  Once anterior tibial artery was exposed, I made a transverse arteriotomy.  There was pulsatile blood flow coming into the anterior tibial artery at this level.  I passed a #3 Fogarty catheter proximally.  No thrombus was evacuated.  I tried to pass a #3 and #2 Fogarty catheter out onto the ankle but could not get very far.  I then performed selective angiography out onto the foot.  This showed an abrupt cut off of the dorsalis pedis artery with some collateralization.  This was a similar finding to her angiogram performed prior to any intervention approximately 1 month ago.  At this point, I felt that the likely explanation for her recurrent occlusion was the lack of outflow.  I did not see any other options for revascularization.  I then closed the arteriotomy in the anterior tibial artery with interrupted 7-0 Prolene.  The incisions for the posterior tibial and anterior tibial artery were closed with a deep layer 3-0 Vicryl and staples.  The below-knee incision was irrigated.  I reapproximated the subcutaneous tissue with 2-0 Vicryl and placed staples on the skin.  At this point, the patient still had a palpable pulse in her anterior tibial and posterior tibial artery.  I did not reverse her heparin.  The plan is to take her to the recovery room and start a heparin drip.  She was then  extubated.  There were no immediate complications.   Disposition: To PACU stable   V. Durene CalWells Brabham, M.D., [facility] [place] Office: [phone] Pager:  [phone]

## 2018-12-02 NOTE — Transfer of Care (Signed)
Immediate Anesthesia Transfer of Care Note  Patient: Debra Ball  Procedure(s) Performed: Thrombectomy left leg, SFA, Popliteal artery, posterior tibial artery, and anterior tibial artery, Redo popliteal exposure, posterior tibia exposure, Patch Angioplasty with xenosure Biological patch on Left tibia fibula trunk, Abdominal Aortogram with left leg runoff (Left ) Left Lower Extremity Angiogram (Left ) Thrombectomy of left SFA, popliteal artery, posterior tibial artery, and anterior tibial artery  Patient Location: PACU  Anesthesia Type:General  Level of Consciousness: drowsy and patient cooperative  Airway & Oxygen Therapy: Patient Spontanous Breathing and Patient connected to face mask oxygen  Post-op Assessment: Report given to RN and Post -op Vital signs reviewed and stable  Post vital signs: Reviewed and stable  Last Vitals:  Vitals Value Taken Time  BP 143/77 12/02/2018  2:31 PM  Temp    Pulse 93 12/02/2018  2:35 PM  Resp 14 12/02/2018  2:35 PM  SpO2 100 % 12/02/2018  2:35 PM  Vitals shown include unvalidated device data.  Last Pain:  Vitals:   12/02/18 0730  TempSrc:   PainSc: 10-Worst pain ever         Complications: No apparent anesthesia complications

## 2018-12-02 NOTE — Progress Notes (Signed)
    Subjective  - POD #1, s/p Left leg thromboembolectomy  C/o pain in her left leg   Physical Exam:  Pedal pulses are  No longer palpable.  No doppler signals Left foot is motttled and painful       Assessment/Plan:  POD #1  Occlusion of last night's intervention.  The patient will require another trip to the OR for revascularization.  It is unclear why this has occurred.  Her final angiogram did not reveal any issues with her repair.  My concern is hat she has poor outflow onto the foot from her initial embolic event.  I tolld her that she is at risk for limb loss if we can not re-establish blood flow  Wells Rondle Lohse 12/02/2018 9:41 AM --  Vitals:   12/02/18 0500 12/02/18 0700  BP: (!) 173/93 (!) 159/98  Pulse: 76 62  Resp:  (!) 23  Temp:  98.4 F (36.9 C)  SpO2:  95%    Intake/Output Summary (Last 24 hours) at 12/02/2018 0941 Last data filed at 12/02/2018 0500 Gross per 24 hour  Intake 2247.94 ml  Output 1275 ml  Net 972.94 ml     Laboratory CBC    Component Value Date/Time   WBC 13.3 (H) 12/02/2018 0703   HGB 9.6 (L) 12/02/2018 0703   HCT 31.3 (L) 12/02/2018 0703   PLT 204 12/02/2018 0703    BMET    Component Value Date/Time   NA 137 12/02/2018 0703   K 4.2 12/02/2018 0703   CL 101 12/02/2018 0703   CO2 23 12/02/2018 0703   GLUCOSE 173 (H) 12/02/2018 0703   BUN 9 12/02/2018 0703   CREATININE 0.90 12/02/2018 0703   CALCIUM 8.3 (L) 12/02/2018 0703   GFRNONAA >60 12/02/2018 0703   GFRAA >60 12/02/2018 0703    COAG Lab Results  Component Value Date   INR 1.0 11/06/2018   No results found for: PTT  Antibiotics Anti-infectives (From admission, onward)   Start     Dose/Rate Route Frequency Ordered Stop   12/02/18 0600  ceFAZolin (ANCEF) IVPB 2g/100 mL premix     2 g 200 mL/hr over 30 Minutes Intravenous Every 8 hours 12/02/18 0414 12/02/18 2159       V. Charlena Cross, M.D., [facility] [place] Office:  [phone] Pager:  [phone]

## 2018-12-02 NOTE — Progress Notes (Signed)
OT Cancellation Note  Patient Details Name: Debra Ball MRN: [id number] DOB: 18-Jul-1976   Cancelled Treatment:    Reason Eval/Treat Not Completed: Patient at procedure or test/ unavailable(OR. Will return as schedule allows. Thank you.)  [name] M [name] [name] MSOT, OTR/L Acute Rehab Pager: [phone] Office: [phone] 12/02/2018, 11:08 AM

## 2018-12-03 ENCOUNTER — Encounter (HOSPITAL_COMMUNITY): Payer: Self-pay | Admitting: Surgery

## 2018-12-03 LAB — BASIC METABOLIC PANEL
Anion gap: 11 (ref 5–15)
BUN: 9 mg/dL (ref 6–20)
CO2: 24 mmol/L (ref 22–32)
Calcium: 8.4 mg/dL — ABNORMAL LOW (ref 8.9–10.3)
Chloride: 104 mmol/L (ref 98–111)
Creatinine, Ser: 0.81 mg/dL (ref 0.44–1.00)
GFR calc Af Amer: >60 mL/min (ref >60)
GFR calc non Af Amer: >60 mL/min (ref >60)
Glucose, Bld: 122 mg/dL — ABNORMAL HIGH (ref 70–99)
Potassium: 3.7 mmol/L (ref 3.5–5.1)
Sodium: 139 mmol/L (ref 135–145)

## 2018-12-03 LAB — CBC
HCT: 22.2 % — ABNORMAL LOW (ref 36.0–46.0)
HCT: 26.5 % — ABNORMAL LOW (ref 36.0–46.0)
Hemoglobin: 6.9 g/dL — CL (ref 12.0–15.0)
Hemoglobin: 8.4 g/dL — ABNORMAL LOW (ref 12.0–15.0)
MCH: 25 pg — ABNORMAL LOW (ref 26.0–34.0)
MCH: 26.1 pg (ref 26.0–34.0)
MCHC: 31.1 g/dL (ref 30.0–36.0)
MCHC: 31.7 g/dL (ref 30.0–36.0)
MCV: 80.4 fL (ref 80.0–100.0)
MCV: 82.3 fL (ref 80.0–100.0)
Platelets: 153 10*3/uL (ref 150–400)
Platelets: 147 10*3/uL — ABNORMAL LOW (ref 150–400)
RBC: 2.76 MIL/uL — ABNORMAL LOW (ref 3.87–5.11)
RBC: 3.22 MIL/uL — ABNORMAL LOW (ref 3.87–5.11)
RDW: 16 % — ABNORMAL HIGH (ref 11.5–15.5)
RDW: 16.4 % — ABNORMAL HIGH (ref 11.5–15.5)
WBC: 11.5 10*3/uL — ABNORMAL HIGH (ref 4.0–10.5)
WBC: 11.7 10*3/uL — ABNORMAL HIGH (ref 4.0–10.5)
nRBC: 0.3 % — ABNORMAL HIGH (ref 0.0–0.2)
nRBC: 0.3 % — ABNORMAL HIGH (ref 0.0–0.2)

## 2018-12-03 LAB — APTT: aPTT: 27 seconds (ref 24–36)

## 2018-12-03 LAB — PREPARE RBC (CROSSMATCH)

## 2018-12-03 MED ORDER — SODIUM CHLORIDE 0.9% IV SOLUTION: Freq: Once | INTRAVENOUS | Status: DC

## 2018-12-03 MED ORDER — DIPHENHYDRAMINE HCL 12.5 MG/5ML PO ELIX
12.5000 mg | ORAL_SOLUTION | Freq: Four times a day (QID) | ORAL | Status: DC | PRN
  Filled 2018-12-03: qty 5

## 2018-12-03 MED ORDER — DIPHENHYDRAMINE HCL 50 MG/ML IJ SOLN: 12.5000 mg | Freq: Four times a day (QID) | INTRAMUSCULAR | Status: DC | PRN

## 2018-12-03 MED ORDER — SODIUM CHLORIDE 0.9% FLUSH: 9.0000 mL | INTRAVENOUS | Status: DC | PRN

## 2018-12-03 MED ORDER — ONDANSETRON HCL 4 MG/2ML IJ SOLN: 4.0000 mg | Freq: Four times a day (QID) | INTRAMUSCULAR | Status: DC | PRN

## 2018-12-03 MED ORDER — ALPRAZOLAM 0.25 MG PO TABS
0.2500 mg | ORAL_TABLET | Freq: Three times a day (TID) | ORAL | Status: DC | PRN
  Administered 2018-12-03: 0.5 mg via ORAL
  Filled 2018-12-03: qty 2

## 2018-12-03 MED ORDER — MORPHINE SULFATE 2 MG/ML IV SOLN
INTRAVENOUS | Status: DC
  Administered 2018-12-03: 9 mg via INTRAVENOUS
  Administered 2018-12-03: 7.5 mg via INTRAVENOUS
  Administered 2018-12-03: 09:00:00 via INTRAVENOUS
  Administered 2018-12-03: 6 mg via INTRAVENOUS
  Administered 2018-12-03: 2 mg via INTRAVENOUS
  Administered 2018-12-04: 4.5 mg via INTRAVENOUS
  Administered 2018-12-04: 6 mg via INTRAVENOUS
  Administered 2018-12-04: 8.23 mg via INTRAVENOUS
  Administered 2018-12-04: 3 mg via INTRAVENOUS
  Administered 2018-12-04: 07:00:00 via INTRAVENOUS
  Administered 2018-12-05: 12 mg via INTRAVENOUS
  Administered 2018-12-05: 21 mg via INTRAVENOUS
  Administered 2018-12-05: 2 mg via INTRAVENOUS
  Administered 2018-12-05: 3 mg via INTRAVENOUS
  Administered 2018-12-05: 0 mg via INTRAVENOUS
  Administered 2018-12-05: 10.45 mg via INTRAVENOUS
  Administered 2018-12-05: 6 mg via INTRAVENOUS
  Administered 2018-12-06: 0 mg via INTRAVENOUS
  Administered 2018-12-06: 3 mg via INTRAVENOUS
  Administered 2018-12-06: 13.5 mg via INTRAVENOUS
  Administered 2018-12-06: 7.5 mg via INTRAVENOUS
  Administered 2018-12-06: 22.5 mg via INTRAVENOUS
  Administered 2018-12-06: 6 mg via INTRAVENOUS
  Administered 2018-12-06: 2 mg via INTRAVENOUS
  Administered 2018-12-07: 12 mg via INTRAVENOUS
  Administered 2018-12-07: 1 mg via INTRAVENOUS
  Administered 2018-12-07: 0 mg via INTRAVENOUS
  Filled 2018-12-03: qty 25
  Filled 2018-12-03 (×2): qty 50
  Filled 2018-12-03: qty 25

## 2018-12-03 MED ORDER — NALOXONE HCL 0.4 MG/ML IJ SOLN: 0.4000 mg | INTRAMUSCULAR | Status: DC | PRN

## 2018-12-03 MED ORDER — SODIUM CHLORIDE 0.9 % IV SOLN
1.5000 g | INTRAVENOUS | Status: AC
  Administered 2018-12-04: 1.5 g via INTRAVENOUS
  Filled 2018-12-03: qty 1.5

## 2018-12-03 NOTE — Progress Notes (Signed)
OT Cancellation Note  Patient Details Name: Debra Ball MRN: [id number] DOB: [birth year]   Cancelled Treatment:    Reason Eval/Treat Not Completed: Pain limiting ability to participate;Fatigue/lethargy limiting ability to participate;Medical issues which prohibited therapy. Pt had been screaming in pain, required sedation, finally resting. RN staff asked to hold. OT will follow acutely for evaluation.  [name] [name] 12/03/2018, 1:49 PM   Sherryl Manges OTR/L Acute Rehabilitation Services Pager: [phone] Office: [phone]

## 2018-12-03 NOTE — Progress Notes (Addendum)
  Progress Note    12/03/2018 8:05 AM 1 Day Post-Op  Subjective:  Patient crying due to pain L foot and toes.   Vitals:   12/03/18 0639 12/03/18 0705  BP: (!) 144/77   Pulse: 87 81  Resp: 16   Temp: 99 F (37.2 C)   SpO2: 100% 98%   Physical Exam: Lungs:  Non labored Incisions:   L popliteal incision no active bleeding, lateral fasciotomy incision dry, tibial incisions also dry Extremities:  Cold L foot with minimal motor and no sensation, no doppler signals Neurologic: A&O  CBC    Component Value Date/Time   WBC 11.5 (H) 12/03/2018 0409   RBC 2.76 (L) 12/03/2018 0409   HGB 6.9 (LL) 12/03/2018 0409   HCT 22.2 (L) 12/03/2018 0409   PLT 153 12/03/2018 0409   MCV 80.4 12/03/2018 0409   MCH 25.0 (L) 12/03/2018 0409   MCHC 31.1 12/03/2018 0409   RDW 16.0 (H) 12/03/2018 0409    BMET    Component Value Date/Time   NA 139 12/03/2018 0409   K 3.7 12/03/2018 0409   CL 104 12/03/2018 0409   CO2 24 12/03/2018 0409   GLUCOSE 122 (H) 12/03/2018 0409   BUN 9 12/03/2018 0409   CREATININE 0.81 12/03/2018 0409   CALCIUM 8.4 (L) 12/03/2018 0409   GFRNONAA >60 12/03/2018 0409   GFRAA >60 12/03/2018 0409    INR    Component Value Date/Time   INR 1.0 11/06/2018 0402     Intake/Output Summary (Last 24 hours) at 12/03/2018 0805 Last data filed at 12/03/2018 0650 Gross per 24 hour  Intake 2332.58 ml  Output 4550 ml  Net -2217.42 ml     Assessment/Plan:  43 y.o. female is s/p thrombembolectomy x2 1 Day Post-Op   -LLE has thrombosed again likely due to lack of outflow -Start morphine PCA Patient will require BKA however not yet ready to consent; discussions are ongoing with patient and husband - Transfusing 1u RBC; check post transfusion CBC   Emilie Rutter, PA-C [facility] [phone] 12/03/2018 8:05 AM  I have seen and evaluated the patient. I agree with the PA note as documented above. No signals in left foot.  Foot cold.  No sensation,  limited motor - barely able to wiggle toes.  Patient very tearful and hysterical at times.  Discussed no further options for revascularization after discussion with Dr. Myra Gianotti and I agree.  No outflow in foot.  Would need BKA.  Patient crying and unable to come to terms with this today.  I will call and discuss with husband.  Will transfuse pRBCs for Hgb 6.9.  Will also give PCA for pain control in interim while patient thinks about BKA.    Cephus Shelling, MD [facility] [place] Office: [phone] Pager: [phone]

## 2018-12-03 NOTE — Anesthesia Postprocedure Evaluation (Signed)
Anesthesia Post Note  Patient: Debra Ball  Procedure(s) Performed: Thrombectomy left leg, SFA, Popliteal artery, posterior tibial artery, and anterior itbial artery, Redo popliteal exposure, posterior tibia exposure (Left ) Patch Angioplasty with xenosure Biological patch on Left tibia fibula trunk (Left ) Abdominal Aortogram with left leg runoff (Left ) Transesophageal Echocardiogram (Tee)     Patient location during evaluation: PACU Anesthesia Type: General Level of consciousness: awake and alert Pain management: pain level controlled Vital Signs Assessment: post-procedure vital signs reviewed and stable Respiratory status: spontaneous breathing, nonlabored ventilation and respiratory function stable Cardiovascular status: blood pressure returned to baseline and stable Postop Assessment: no apparent nausea or vomiting Anesthetic complications: no    Last Vitals:  Vitals:   12/03/18 0705 12/03/18 0853  BP:    Pulse: 81   Resp:  15  Temp:    SpO2: 98% 99%    Last Pain:  Vitals:   12/03/18 0853  TempSrc:   PainSc: 10-Worst pain ever                 Clance Baquero,W. EDMOND

## 2018-12-03 NOTE — Progress Notes (Signed)
PT Cancellation Note  Patient Details Name: Debra Ball MRN: [id number] DOB: 09-19-1975   Cancelled Treatment:    Reason Eval/Treat Not Completed: Pain limiting ability to participate(per RN, pt has been in severe pain that has been difficult to control. Pt just fell asleep, RN requested pt not be disturbed. Will follow. )  Tamala Ser PT 12/03/2018  Acute Rehabilitation Services Pager [phone] Office [phone]

## 2018-12-03 NOTE — Progress Notes (Signed)
42-year-old female that underwent extensive left lower extremity thrombectomy this week with Dr. Brabham.  Ultimately signal loss yesterday after repeated trips to the OR.  This morning she had no signals in left foot, no sensation in the left foot, and had profound motor deficit.  No additional options for revascularization as previously noted.  Patient is now agreeable to left below-knee amputation.  Scheduled tomorrow with Dr. Dickson.  Morphine PCA added today for pain control.  I discussed with the husband this afternoon.  Please have her n.p.o. after midnight.  Debra Ball J. Alizia Greif, MD [facility] [place] Office: [phone] Pager: [phone]  Debra Ball J Omere Marti 

## 2018-12-03 NOTE — H&P (View-Only) (Signed)
43 year old female that underwent extensive left lower extremity thrombectomy this week with Dr. Myra Gianotti.  Ultimately signal loss yesterday after repeated trips to the OR.  This morning she had no signals in left foot, no sensation in the left foot, and had profound motor deficit.  No additional options for revascularization as previously noted.  Patient is now agreeable to left below-knee amputation.  Scheduled tomorrow with Dr. Edilia Bo.  Morphine PCA added today for pain control.  I discussed with the husband this afternoon.  Please have her n.p.o. after midnight.  Cephus Shelling, MD [facility] [place] Office: [phone] Pager: [phone]  Cephus Shelling

## 2018-12-04 ENCOUNTER — Encounter (HOSPITAL_COMMUNITY): Payer: Self-pay

## 2018-12-04 ENCOUNTER — Inpatient Hospital Stay (HOSPITAL_COMMUNITY): Payer: BLUE CROSS/BLUE SHIELD | Admitting: Certified Registered"

## 2018-12-04 ENCOUNTER — Encounter (HOSPITAL_COMMUNITY)
Admission: EM | Disposition: A | Discharge status: Rehabilitation Facility | Payer: Self-pay | Source: Other Acute Inpatient Hospital | Attending: Surgery

## 2018-12-04 DIAGNOSIS — I998 Other disorder of circulatory system: Secondary | ICD-10-CM

## 2018-12-04 DIAGNOSIS — I743 Embolism and thrombosis of arteries of the lower extremities: Secondary | ICD-10-CM

## 2018-12-04 HISTORY — PX: AMPUTATION: SHX166

## 2018-12-04 LAB — CBC
HCT: 25.7 % — ABNORMAL LOW (ref 36.0–46.0)
HCT: 23.7 % — ABNORMAL LOW (ref 36.0–46.0)
Hemoglobin: 7.9 g/dL — ABNORMAL LOW (ref 12.0–15.0)
Hemoglobin: 7.4 g/dL — ABNORMAL LOW (ref 12.0–15.0)
MCH: 25.6 pg — ABNORMAL LOW (ref 26.0–34.0)
MCH: 26 pg (ref 26.0–34.0)
MCHC: 30.7 g/dL (ref 30.0–36.0)
MCHC: 31.2 g/dL (ref 30.0–36.0)
MCV: 83.4 fL (ref 80.0–100.0)
MCV: 83.2 fL (ref 80.0–100.0)
Platelets: 148 10*3/uL — ABNORMAL LOW (ref 150–400)
Platelets: 146 10*3/uL — ABNORMAL LOW (ref 150–400)
RBC: 3.08 MIL/uL — ABNORMAL LOW (ref 3.87–5.11)
RBC: 2.85 MIL/uL — ABNORMAL LOW (ref 3.87–5.11)
RDW: 16.4 % — ABNORMAL HIGH (ref 11.5–15.5)
RDW: 16.4 % — ABNORMAL HIGH (ref 11.5–15.5)
WBC: 8.9 10*3/uL (ref 4.0–10.5)
WBC: 8.8 10*3/uL (ref 4.0–10.5)
nRBC: 0 % (ref 0.0–0.2)
nRBC: 0 % (ref 0.0–0.2)

## 2018-12-04 LAB — CREATININE, SERUM
Creatinine, Ser: 0.77 mg/dL (ref 0.44–1.00)
GFR calc Af Amer: >60 mL/min (ref >60)
GFR calc non Af Amer: >60 mL/min (ref >60)

## 2018-12-04 LAB — APTT: aPTT: 28 seconds (ref 24–36)

## 2018-12-04 SURGERY — AMPUTATION BELOW KNEE
Anesthesia: General | Laterality: Left

## 2018-12-04 MED ORDER — ONDANSETRON HCL 4 MG/2ML IJ SOLN
INTRAMUSCULAR | Status: DC | PRN
  Administered 2018-12-04: 4 mg via INTRAVENOUS

## 2018-12-04 MED ORDER — SODIUM CHLORIDE 0.9 % IV SOLN
INTRAVENOUS | Status: DC | PRN
  Administered 2018-12-04: 50 ug/min via INTRAVENOUS

## 2018-12-04 MED ORDER — ROCURONIUM BROMIDE 10 MG/ML (PF) SYRINGE
PREFILLED_SYRINGE | INTRAVENOUS | Status: AC
  Filled 2018-12-04: qty 10

## 2018-12-04 MED ORDER — SUGAMMADEX SODIUM 200 MG/2ML IV SOLN
INTRAVENOUS | Status: DC | PRN
  Administered 2018-12-04: 160 mg via INTRAVENOUS

## 2018-12-04 MED ORDER — PROPOFOL 10 MG/ML IV BOLUS
INTRAVENOUS | Status: DC | PRN
  Administered 2018-12-04: 150 mg via INTRAVENOUS

## 2018-12-04 MED ORDER — HEPARIN SODIUM (PORCINE) 5000 UNIT/ML IJ SOLN
5000.0000 [IU] | Freq: Three times a day (TID) | INTRAMUSCULAR | Status: DC
  Administered 2018-12-05 – 2018-12-07 (×7): 5000 [IU] via SUBCUTANEOUS
  Filled 2018-12-04 (×7): qty 1

## 2018-12-04 MED ORDER — BACITRACIN ZINC 500 UNIT/GM EX OINT
TOPICAL_OINTMENT | CUTANEOUS | Status: AC
  Filled 2018-12-04: qty 28.35

## 2018-12-04 MED ORDER — PHENYLEPHRINE 40 MCG/ML (10ML) SYRINGE FOR IV PUSH (FOR BLOOD PRESSURE SUPPORT)
PREFILLED_SYRINGE | INTRAVENOUS | Status: DC | PRN
  Administered 2018-12-04: 120 ug via INTRAVENOUS
  Administered 2018-12-04: 80 ug via INTRAVENOUS
  Administered 2018-12-04: 120 ug via INTRAVENOUS

## 2018-12-04 MED ORDER — CEFAZOLIN SODIUM-DEXTROSE 2-4 GM/100ML-% IV SOLN
2.0000 g | Freq: Three times a day (TID) | INTRAVENOUS | Status: AC
  Administered 2018-12-04 – 2018-12-05 (×2): 2 g via INTRAVENOUS
  Filled 2018-12-04 (×2): qty 100

## 2018-12-04 MED ORDER — 0.9 % SODIUM CHLORIDE (POUR BTL) OPTIME
TOPICAL | Status: DC | PRN
  Administered 2018-12-04: 1000 mL

## 2018-12-04 MED ORDER — EPHEDRINE 5 MG/ML INJ
INTRAVENOUS | Status: AC
  Filled 2018-12-04: qty 10

## 2018-12-04 MED ORDER — FENTANYL CITRATE (PF) 250 MCG/5ML IJ SOLN
INTRAMUSCULAR | Status: AC
  Filled 2018-12-04: qty 5

## 2018-12-04 MED ORDER — MIDAZOLAM HCL 2 MG/2ML IJ SOLN
1.0000 mg | Freq: Once | INTRAMUSCULAR | Status: AC
  Administered 2018-12-04: 1 mg via INTRAVENOUS

## 2018-12-04 MED ORDER — LIDOCAINE 2% (20 MG/ML) 5 ML SYRINGE
INTRAMUSCULAR | Status: AC
  Filled 2018-12-04: qty 5

## 2018-12-04 MED ORDER — ALBUMIN HUMAN 5 % IV SOLN
INTRAVENOUS | Status: DC | PRN
  Administered 2018-12-04: 11:00:00 via INTRAVENOUS

## 2018-12-04 MED ORDER — FENTANYL CITRATE (PF) 100 MCG/2ML IJ SOLN
INTRAMUSCULAR | Status: DC | PRN
  Administered 2018-12-04: 50 ug via INTRAVENOUS

## 2018-12-04 MED ORDER — FENTANYL CITRATE (PF) 100 MCG/2ML IJ SOLN
50.0000 ug | Freq: Once | INTRAMUSCULAR | Status: AC
  Administered 2018-12-04: 10:00:00 50 ug via INTRAVENOUS

## 2018-12-04 MED ORDER — ROCURONIUM BROMIDE 10 MG/ML (PF) SYRINGE
PREFILLED_SYRINGE | INTRAVENOUS | Status: DC | PRN
  Administered 2018-12-04: 30 mg via INTRAVENOUS

## 2018-12-04 MED ORDER — MIDAZOLAM HCL 2 MG/2ML IJ SOLN
INTRAMUSCULAR | Status: AC
  Filled 2018-12-04: qty 2

## 2018-12-04 MED ORDER — PHENYLEPHRINE 40 MCG/ML (10ML) SYRINGE FOR IV PUSH (FOR BLOOD PRESSURE SUPPORT)
PREFILLED_SYRINGE | INTRAVENOUS | Status: AC
  Filled 2018-12-04: qty 10

## 2018-12-04 MED ORDER — DEXAMETHASONE SODIUM PHOSPHATE 10 MG/ML IJ SOLN
INTRAMUSCULAR | Status: AC
  Filled 2018-12-04: qty 1

## 2018-12-04 MED ORDER — ACETAMINOPHEN 500 MG PO TABS
1000.0000 mg | ORAL_TABLET | Freq: Once | ORAL | Status: AC
  Administered 2018-12-04: 1000 mg via ORAL
  Filled 2018-12-04: qty 2

## 2018-12-04 MED ORDER — FENTANYL CITRATE (PF) 100 MCG/2ML IJ SOLN
INTRAMUSCULAR | Status: AC
  Administered 2018-12-04: 50 ug via INTRAVENOUS
  Filled 2018-12-04: qty 2

## 2018-12-04 MED ORDER — PROPOFOL 10 MG/ML IV BOLUS
INTRAVENOUS | Status: AC
  Filled 2018-12-04: qty 20

## 2018-12-04 MED ORDER — LACTATED RINGERS IV SOLN
INTRAVENOUS | Status: DC | PRN
  Administered 2018-12-04: 11:00:00 via INTRAVENOUS

## 2018-12-04 MED ORDER — LACTATED RINGERS IV SOLN: INTRAVENOUS | Status: DC

## 2018-12-04 MED ORDER — SUCCINYLCHOLINE CHLORIDE 200 MG/10ML IV SOSY
PREFILLED_SYRINGE | INTRAVENOUS | Status: DC | PRN
  Administered 2018-12-04: 100 mg via INTRAVENOUS

## 2018-12-04 MED ORDER — DEXAMETHASONE SODIUM PHOSPHATE 10 MG/ML IJ SOLN
INTRAMUSCULAR | Status: DC | PRN
  Administered 2018-12-04: 4 mg via INTRAVENOUS

## 2018-12-04 MED ORDER — SUCCINYLCHOLINE CHLORIDE 200 MG/10ML IV SOSY
PREFILLED_SYRINGE | INTRAVENOUS | Status: AC
  Filled 2018-12-04: qty 10

## 2018-12-04 MED ORDER — ACETAMINOPHEN 500 MG PO TABS: 1000.0000 mg | ORAL_TABLET | Freq: Once | ORAL | Status: DC

## 2018-12-04 MED ORDER — BUPIVACAINE-EPINEPHRINE (PF) 0.5% -1:200000 IJ SOLN
INTRAMUSCULAR | Status: DC | PRN
  Administered 2018-12-04: 30 mL via PERINEURAL
  Administered 2018-12-04: 15 mL via PERINEURAL

## 2018-12-04 MED ORDER — MIDAZOLAM HCL 2 MG/2ML IJ SOLN
INTRAMUSCULAR | Status: AC
  Administered 2018-12-04: 1 mg via INTRAVENOUS
  Filled 2018-12-04: qty 2

## 2018-12-04 MED ORDER — OXYCODONE-ACETAMINOPHEN 5-325 MG PO TABS
1.0000 | ORAL_TABLET | ORAL | Status: DC | PRN
  Administered 2018-12-05 (×3): 2 via ORAL
  Filled 2018-12-04 (×3): qty 2

## 2018-12-04 MED ORDER — ONDANSETRON HCL 4 MG/2ML IJ SOLN
INTRAMUSCULAR | Status: AC
  Filled 2018-12-04: qty 2

## 2018-12-04 MED ORDER — BACITRACIN ZINC 500 UNIT/GM EX OINT
TOPICAL_OINTMENT | CUTANEOUS | Status: DC | PRN
  Administered 2018-12-04: 1 via TOPICAL

## 2018-12-04 MED FILL — Morphine Sulfate IV Soln PF 10 MG/ML: INTRAVENOUS | Qty: 50 | Status: AC

## 2018-12-04 MED FILL — Sodium Chloride IV Soln 0.9%: INTRAVENOUS | Qty: 20 | Status: AC

## 2018-12-04 SURGICAL SUPPLY — 51 items
BANDAGE ACE 4X5 VEL STRL LF (GAUZE/BANDAGES/DRESSINGS) ×2 IMPLANT
BANDAGE ELASTIC 4 VELCRO ST LF (GAUZE/BANDAGES/DRESSINGS) ×4 IMPLANT
BANDAGE ESMARK 6X9 LF (GAUZE/BANDAGES/DRESSINGS) ×1 IMPLANT
BLADE SAW RECIP 87.9 MT (BLADE) ×3 IMPLANT
BNDG COHESIVE 6X5 TAN STRL LF (GAUZE/BANDAGES/DRESSINGS) ×3 IMPLANT
BNDG ESMARK 6X9 LF (GAUZE/BANDAGES/DRESSINGS) ×3
BNDG GAUZE ELAST 4 BULKY (GAUZE/BANDAGES/DRESSINGS) ×6 IMPLANT
CANISTER SUCT 3000ML PPV (MISCELLANEOUS) ×4 IMPLANT
CLIP VESOCCLUDE MED 6/CT (CLIP) ×2 IMPLANT
COVER SURGICAL LIGHT HANDLE (MISCELLANEOUS) ×3 IMPLANT
COVER WAND RF STERILE (DRAPES) ×1 IMPLANT
CUFF TOURNIQUET SINGLE 24IN (TOURNIQUET CUFF) ×2 IMPLANT
CUFF TOURNIQUET SINGLE 34IN LL (TOURNIQUET CUFF) IMPLANT
CUFF TOURNIQUET SINGLE 44IN (TOURNIQUET CUFF) IMPLANT
DRAIN CHANNEL 19F RND (DRAIN) IMPLANT
DRAPE HALF SHEET 40X57 (DRAPES) ×3 IMPLANT
DRAPE ORTHO SPLIT 77X108 STRL (DRAPES) ×4
DRAPE SURG ORHT 6 SPLT 77X108 (DRAPES) ×2 IMPLANT
DRAPE U-SHAPE 47X51 STRL (DRAPES) ×3 IMPLANT
DRSG ADAPTIC 3X8 NADH LF (GAUZE/BANDAGES/DRESSINGS) ×3 IMPLANT
DRSG PAD ABDOMINAL 8X10 ST (GAUZE/BANDAGES/DRESSINGS) ×2 IMPLANT
ELECT REM PT RETURN 9FT ADLT (ELECTROSURGICAL) ×3
ELECTRODE REM PT RTRN 9FT ADLT (ELECTROSURGICAL) ×1 IMPLANT
EVACUATOR SILICONE 100CC (DRAIN) IMPLANT
GAUZE SPONGE 4X4 12PLY STRL (GAUZE/BANDAGES/DRESSINGS) ×6 IMPLANT
GAUZE SPONGE 4X4 12PLY STRL LF (GAUZE/BANDAGES/DRESSINGS) ×4 IMPLANT
GLOVE BIO SURGEON STRL SZ7.5 (GLOVE) ×5 IMPLANT
GLOVE BIOGEL PI IND STRL 8 (GLOVE) ×1 IMPLANT
GLOVE BIOGEL PI INDICATOR 8 (GLOVE) ×2
GOWN STRL REUS W/ TWL LRG LVL3 (GOWN DISPOSABLE) ×3 IMPLANT
GOWN STRL REUS W/TWL LRG LVL3 (GOWN DISPOSABLE) ×6
KIT BASIN OR (CUSTOM PROCEDURE TRAY) ×3 IMPLANT
KIT TURNOVER KIT B (KITS) ×3 IMPLANT
NS IRRIG 1000ML POUR BTL (IV SOLUTION) ×3 IMPLANT
PACK GENERAL/GYN (CUSTOM PROCEDURE TRAY) ×3 IMPLANT
PAD ARMBOARD 7.5X6 YLW CONV (MISCELLANEOUS) ×6 IMPLANT
RASP HELIOCORDIAL MED (MISCELLANEOUS) IMPLANT
SPONGE LAP 18X18 RF (DISPOSABLE) ×4 IMPLANT
STAPLER VISISTAT (STAPLE) ×3 IMPLANT
STOCKINETTE IMPERVIOUS LG (DRAPES) ×3 IMPLANT
SUT ETHILON 3 0 PS 1 (SUTURE) IMPLANT
SUT SILK 0 TIES 10X30 (SUTURE) ×3 IMPLANT
SUT SILK 2 0 (SUTURE) ×2
SUT SILK 2 0 SH CR/8 (SUTURE) ×5 IMPLANT
SUT SILK 2-0 18XBRD TIE 12 (SUTURE) ×1 IMPLANT
SUT SILK 3 0 (SUTURE) ×2
SUT SILK 3-0 18XBRD TIE 12 (SUTURE) ×1 IMPLANT
SUT VIC AB 2-0 CT1 18 (SUTURE) ×5 IMPLANT
TOWEL GREEN STERILE (TOWEL DISPOSABLE) ×6 IMPLANT
UNDERPAD 30X30 (UNDERPADS AND DIAPERS) ×3 IMPLANT
WATER STERILE IRR 1000ML POUR (IV SOLUTION) ×3 IMPLANT

## 2018-12-04 NOTE — Progress Notes (Signed)
PCA pumped removed and given to Lauren, RN from  4E.

## 2018-12-04 NOTE — Op Note (Signed)
    NAME: Debra Ball    MRN: [id number] DOB: [birth year]    DATE OF OPERATION: 12/04/2018  PREOP DIAGNOSIS:    Ischemic left foot  POSTOP DIAGNOSIS:    Same  PROCEDURE:    Left below the knee amputation  SURGEON: Di Kindle. Edilia Bo, MD, FACS  ASSIST: Aggie Moats, PA  ANESTHESIA: General  EBL: 100 cc  INDICATIONS:    Debra Ball is a 43 y.o. female who underwent attempted revascularization for an acutely ischemic leg.  Ultimately the leg could not be salvaged and she presents for below the knee amputation.  FINDINGS:   The muscle appeared well perfused.  TECHNIQUE:   The patient was taken to the operating room and received a general anesthetic.  The left leg was prepped and draped in usual sterile fashion.  The circumference of the limb was measured 10 cm distal to the tibial tuberosity and two thirds of this distance was used to mark the anterior skin flap.  A long posterior flap of equal length was also marked.  A tourniquet had been placed on the upper thigh.  The leg was exsanguinated with an Esmarch bandage and the tourniquet inflated to 300 mmHg.  Under tourniquet control the incision was carried down to the skin subcutaneous tissue muscle to the tibia and fibula which were dissected free circumferentially.  The bone was divided using a saw and the anterior aspect of the tibia was beveled.  The arteries and veins were individually suture ligated with 2-0 silk ties.  The tourniquet was then released.  There was a lot of swelling and I thought the wound would be under too much tension therefore I did remove some muscle both anteriorly and posteriorly and also shorten the fibula and the tibia.  Again the anterior aspect of the tibia was beveled.  Hemostasis was obtained in the wound.  The wound was then closed with interrupted 2-0 Vicryl's.  The incision for the fasciotomy site was closed with a deep layer of 2-0 Vicryl and the skin closed with staples.   The incision was closed with staples.  Sterile dressing was applied.  The patient tolerated the procedure well was transferred to recovery room in stable condition.  All needle and sponge counts were correct.  Waverly Ferrari, MD, FACS [facility]  DATE OF DICTATION:   12/04/2018

## 2018-12-04 NOTE — Interval H&P Note (Signed)
History and Physical Interval Note:  12/04/2018 10:08 AM  Debra Ball  has presented today for surgery, with the diagnosis of CRITICAL LIMB ISCHEMIA NONVIABLE TISSUE LEFT LOWER EXTREMITY.  The various methods of treatment have been discussed with the patient and family. After consideration of risks, benefits and other options for treatment, the patient has consented to  Procedure(s): AMPUTATION BELOW KNEE LEFT (Left) as a surgical intervention.  The patient's history has been reviewed, patient examined, no change in status, stable for surgery.  I have reviewed the patient's chart and labs.  Questions were answered to the patient's satisfaction.     Waverly Ferrari

## 2018-12-04 NOTE — Progress Notes (Signed)
Pt arrived back to 4e from the PACU. Left femoral pulse palpable. Vitals obtained and stable. Left BKA compression dressing intact. Full-dose morphine PCA re-ordered and is attached to pt. Ice chips provided, per pt request.   Ardeen Jourdain BSN, RN

## 2018-12-04 NOTE — Anesthesia Procedure Notes (Addendum)
Procedure Name: Intubation Date/Time: 12/04/2018 10:58 AM Performed by: Lavell Luster, CRNA Pre-anesthesia Checklist: Patient identified, Emergency Drugs available, Suction available and Patient being monitored Patient Re-evaluated:Patient Re-evaluated prior to induction Oxygen Delivery Method: Circle System Utilized Preoxygenation: Pre-oxygenation with 100% oxygen Induction Type: IV induction and Rapid sequence Laryngoscope Size: Glidescope, 4 and Mac Grade View: Grade I Tube type: Oral Tube size: 7.0 mm Number of attempts: 1 Airway Equipment and Method: Oral airway,  Video-laryngoscopy and Rigid stylet Placement Confirmation: ETT inserted through vocal cords under direct vision,  positive ETCO2 and breath sounds checked- equal and bilateral Secured at: 21 cm Tube secured with: Tape Dental Injury: Teeth and Oropharynx as per pre-operative assessment  Difficulty Due To: Difficulty was anticipated, Difficult Airway- due to reduced neck mobility and Difficult Airway- due to anterior larynx Future Recommendations: Recommend- induction with short-acting agent, and alternative techniques readily available

## 2018-12-04 NOTE — Anesthesia Procedure Notes (Signed)
Anesthesia Regional Block: Popliteal block   Pre-Anesthetic Checklist: ,, timeout performed, Correct Patient, Correct Site, Correct Laterality, Correct Procedure, Correct Position, site marked, Risks and benefits discussed, pre-op evaluation,  At surgeon's request and post-op pain management  Laterality: Left  Prep: Maximum Sterile Barrier Precautions used, chloraprep       Needles:  Injection technique: Single-shot  Needle Type: Echogenic Stimulator Needle     Needle Length: 9cm  Needle Gauge: 21     Additional Needles:   Procedures:,,,, ultrasound used (permanent image in chart),,,,  Narrative:  Start time: 12/04/2018 10:20 AM End time: 12/04/2018 10:30 AM Injection made incrementally with aspirations every 5 mL. Anesthesiologist: Gaynelle Adu, MD  Additional Notes: 2% Lidocaine skin wheel.

## 2018-12-04 NOTE — Transfer of Care (Signed)
Immediate Anesthesia Transfer of Care Note  Patient: Debra Ball  Procedure(s) Performed: LEFT BELOW KNEE AMPUTATION (Left )  Patient Location: PACU  Anesthesia Type:General  Level of Consciousness: awake, alert  and sedated  Airway & Oxygen Therapy: Patient connected to face mask oxygen  Post-op Assessment: Post -op Vital signs reviewed and stable  Post vital signs: stable  Last Vitals:  Vitals Value Taken Time  BP 118/55 12/04/2018 12:32 PM  Temp    Pulse 107 12/04/2018 12:32 PM  Resp 14 12/04/2018 12:32 PM  SpO2 100 % 12/04/2018 12:32 PM  Vitals shown include unvalidated device data.  Last Pain:  Vitals:   12/04/18 1045  TempSrc:   PainSc: 0-No pain      Patients Stated Pain Goal: 8 (12/04/18 1027)  Complications: No apparent anesthesia complications

## 2018-12-04 NOTE — Progress Notes (Signed)
Report received from Leotis Shames, RN , 4E.

## 2018-12-04 NOTE — Anesthesia Postprocedure Evaluation (Signed)
Anesthesia Post Note  Patient: Debra Ball  Procedure(s) Performed: LEFT BELOW KNEE AMPUTATION (Left )     Patient location during evaluation: PACU Anesthesia Type: General and Regional Level of consciousness: awake and alert Pain management: pain level controlled Vital Signs Assessment: post-procedure vital signs reviewed and stable Respiratory status: spontaneous breathing, nonlabored ventilation and respiratory function stable Cardiovascular status: blood pressure returned to baseline and stable Postop Assessment: no apparent nausea or vomiting Anesthetic complications: no    Last Vitals:  Vitals:   12/04/18 1245 12/04/18 1300  BP: (!) 125/55 107/60  Pulse: (!) 106 (!) 102  Resp: 14 13  Temp:    SpO2: 97% 95%    Last Pain:  Vitals:   12/04/18 1300  TempSrc:   PainSc: Asleep                 Tayven Renteria,W. EDMOND

## 2018-12-04 NOTE — Consult Note (Signed)
PV Navigator consult acknowledged. Working from remote location. Chart reviewed. Noted 43 y/o female admitted 12/01/2018 for emergent attempt at revascularization L leg, embolectomy and angiography with Dr Myra Gianotti due to non palpable pedal pulse, ischemic left leg. Revascularization unsuccessful despite frequent OR intervention. No additional revascularization options available. L BKA performed today by Dr Edilia Bo.  Limited medical history. Pt is a smoker. Will attempt to reach out via telephone tomorrow to assess needs and leave contact information. Feel patient may be too sedate so soon after surgery, to speak to me today.  Thank you for this consult. Hilma Favors RN BSN CWS PV Navigator [employer] [phone]

## 2018-12-04 NOTE — Anesthesia Procedure Notes (Signed)
Anesthesia Regional Block: Femoral nerve block   Pre-Anesthetic Checklist: ,, timeout performed, Correct Patient, Correct Site, Correct Laterality, Correct Procedure, Correct Position, site marked, Risks and benefits discussed, pre-op evaluation,  At surgeon's request and post-op pain management  Laterality: Left  Prep: Maximum Sterile Barrier Precautions used, chloraprep       Needles:  Injection technique: Single-shot  Needle Type: Echogenic Stimulator Needle     Needle Length: 9cm  Needle Gauge: 21     Additional Needles:   Procedures:,,,, ultrasound used (permanent image in chart),,,,  Narrative:  Start time: 12/04/2018 10:30 AM End time: 12/04/2018 10:40 AM Injection made incrementally with aspirations every 5 mL. Anesthesiologist: Gaynelle Adu, MD  Additional Notes: 2% Lidocaine skin wheel.

## 2018-12-04 NOTE — Progress Notes (Signed)
OT Cancellation Note  Patient Details Name: Debra Ball MRN: [id number] DOB: November 20, 1975   Cancelled Treatment:    Reason Eval/Treat Not Completed: Patient at procedure or test/ unavailable.  Jeani Hawking, OTR/L Acute Rehabilitation Services Pager [phone] Office [phone]   Jeani Hawking M 12/04/2018, 9:58 AM

## 2018-12-04 NOTE — Progress Notes (Signed)
PT Cancellation Note  Patient Details Name: Debra Ball MRN: [id number] DOB: September 14, 1975   Cancelled Treatment:    Reason Eval/Treat Not Completed: Patient at procedure or test/unavailable.  Pt has been in surgery or unavailable all day.  Will see pt as able today or 5/13. 12/04/2018  Roscoe Bing, PT Acute Rehabilitation Services [phone]  (pager) [phone]  (office)    Eliseo Gum Sarya Linenberger 12/04/2018, 1:35 PM

## 2018-12-04 NOTE — Anesthesia Preprocedure Evaluation (Addendum)
Anesthesia Evaluation  Patient identified by MRN, date of birth, ID band Patient awake    Reviewed: Allergy & Precautions, H&P , NPO status , Patient's Chart, lab work & pertinent test results  Airway Mallampati: III  TM Distance: >3 FB Neck ROM: Full    Dental no notable dental hx. (+) Teeth Intact, Dental Advisory Given   Pulmonary Current Smoker,    Pulmonary exam normal breath sounds clear to auscultation       Cardiovascular + Peripheral Vascular Disease  negative cardio ROS   Rhythm:Regular Rate:Normal     Neuro/Psych negative neurological ROS  negative psych ROS   GI/Hepatic negative GI ROS, Neg liver ROS,   Endo/Other  negative endocrine ROS  Renal/GU negative Renal ROS  negative genitourinary   Musculoskeletal   Abdominal   Peds  Hematology negative hematology ROS (+)   Anesthesia Other Findings   Reproductive/Obstetrics negative OB ROS                            Anesthesia Physical Anesthesia Plan  ASA: III  Anesthesia Plan: General   Post-op Pain Management:  Regional for Post-op pain   Induction: Intravenous  PONV Risk Score and Plan: 3 and Ondansetron, Dexamethasone and Midazolam  Airway Management Planned: Oral ETT and Video Laryngoscope Planned  Additional Equipment:   Intra-op Plan:   Post-operative Plan: Extubation in OR  Informed Consent: I have reviewed the patients History and Physical, chart, labs and discussed the procedure including the risks, benefits and alternatives for the proposed anesthesia with the patient or authorized representative who has indicated his/her understanding and acceptance.     Dental advisory given  Plan Discussed with: CRNA  Anesthesia Plan Comments:        Anesthesia Quick Evaluation

## 2018-12-04 NOTE — Progress Notes (Signed)
Morphine PCA pump history cleared. Pt received 20.23 mg in the last 8 hours. Pt receive 8.23 mg in the last 4 hours.

## 2018-12-05 ENCOUNTER — Encounter (HOSPITAL_COMMUNITY): Payer: Self-pay | Admitting: Vascular Surgery

## 2018-12-05 ENCOUNTER — Telehealth (HOSPITAL_COMMUNITY): Payer: Self-pay

## 2018-12-05 LAB — CBC
HCT: 21.9 % — ABNORMAL LOW (ref 36.0–46.0)
HCT: 26 % — ABNORMAL LOW (ref 36.0–46.0)
Hemoglobin: 6.9 g/dL — CL (ref 12.0–15.0)
Hemoglobin: 8.3 g/dL — ABNORMAL LOW (ref 12.0–15.0)
MCH: 26.2 pg (ref 26.0–34.0)
MCH: 26.4 pg (ref 26.0–34.0)
MCHC: 31.5 g/dL (ref 30.0–36.0)
MCHC: 31.9 g/dL (ref 30.0–36.0)
MCV: 83.3 fL (ref 80.0–100.0)
MCV: 82.8 fL (ref 80.0–100.0)
Platelets: 149 10*3/uL — ABNORMAL LOW (ref 150–400)
Platelets: 155 10*3/uL (ref 150–400)
RBC: 2.63 MIL/uL — ABNORMAL LOW (ref 3.87–5.11)
RBC: 3.14 MIL/uL — ABNORMAL LOW (ref 3.87–5.11)
RDW: 16.4 % — ABNORMAL HIGH (ref 11.5–15.5)
RDW: 17 % — ABNORMAL HIGH (ref 11.5–15.5)
WBC: 12.2 10*3/uL — ABNORMAL HIGH (ref 4.0–10.5)
WBC: 9.9 10*3/uL (ref 4.0–10.5)
nRBC: 0.2 % (ref 0.0–0.2)
nRBC: 0.2 % (ref 0.0–0.2)

## 2018-12-05 LAB — BPAM RBC
Blood Product Expiration Date: 202005192359
Blood Product Expiration Date: 202005202359
Blood Product Expiration Date: 202005212359
Blood Product Expiration Date: 202005222359
ISSUE DATE / TIME: 202005092324
ISSUE DATE / TIME: 202005092324
ISSUE DATE / TIME: 202005110627
Unit Type and Rh: 7300
Unit Type and Rh: 7300
Unit Type and Rh: 7300
Unit Type and Rh: 7300

## 2018-12-05 LAB — TYPE AND SCREEN
ABO/RH(D): B POS
Antibody Screen: NEGATIVE
Unit division: 0
Unit division: 0
Unit division: 0
Unit division: 0

## 2018-12-05 LAB — APTT: aPTT: 31 seconds (ref 24–36)

## 2018-12-05 LAB — BASIC METABOLIC PANEL
Anion gap: 10 (ref 5–15)
BUN: 14 mg/dL (ref 6–20)
CO2: 24 mmol/L (ref 22–32)
Calcium: 8.6 mg/dL — ABNORMAL LOW (ref 8.9–10.3)
Chloride: 104 mmol/L (ref 98–111)
Creatinine, Ser: 0.81 mg/dL (ref 0.44–1.00)
GFR calc Af Amer: >60 mL/min (ref >60)
GFR calc non Af Amer: >60 mL/min (ref >60)
Glucose, Bld: 128 mg/dL — ABNORMAL HIGH (ref 70–99)
Potassium: 3.8 mmol/L (ref 3.5–5.1)
Sodium: 138 mmol/L (ref 135–145)

## 2018-12-05 LAB — PREPARE RBC (CROSSMATCH)

## 2018-12-05 MED ORDER — MORPHINE SULFATE (PF) 4 MG/ML IV SOLN
5.0000 mg | Freq: Once | INTRAVENOUS | Status: AC
  Administered 2018-12-05: 5 mg via INTRAVENOUS
  Filled 2018-12-05: qty 2

## 2018-12-05 MED ORDER — SODIUM CHLORIDE 0.9% IV SOLUTION: Freq: Once | INTRAVENOUS | Status: DC

## 2018-12-05 MED ORDER — KETOROLAC TROMETHAMINE 30 MG/ML IJ SOLN
30.0000 mg | Freq: Four times a day (QID) | INTRAMUSCULAR | Status: DC
  Administered 2018-12-05 – 2018-12-07 (×7): 30 mg via INTRAVENOUS
  Filled 2018-12-05 (×8): qty 1

## 2018-12-05 MED FILL — Morphine Sulfate IV Soln PF 10 MG/ML: INTRAVENOUS | Qty: 50 | Status: AC

## 2018-12-05 MED FILL — Sodium Chloride IV Soln 0.9%: INTRAVENOUS | Qty: 20 | Status: AC

## 2018-12-05 NOTE — Progress Notes (Signed)
Pt was crying in room, pain 10/10 to surgical site. Prn pain med given. Noticed Iv site was leaking. New Iv started. Morphine switched to new IV site. Offered pt some ice for surgical area, pt refused. She said morphine didn't work yesterday, Her nerve block seems to be wearing off. Will continue to monitor.

## 2018-12-05 NOTE — Telephone Encounter (Signed)
Phone call to patient s/p L BKA. No answer. Left a message informing of PV Navigator role and would like to speak w/her to assess any questions or barriers she may have. Left name and contact information on voicemail.  Hilma Favors RN BSN CWS PV Navigator [employer] [phone]

## 2018-12-05 NOTE — Progress Notes (Signed)
OT Cancellation Note  Patient Details Name: Cedria Stilling MRN: [id number] DOB: 01/21/1976   Cancelled Treatment:    Reason Eval/Treat Not Completed: Pain limiting ability to participate;Medical issues which prohibited therapy(Pt with Hgb 6.9 getting blood.  Pain not under control.)  [name] Kadasia Kassing 12/05/2018, 10:45 AM   Sherryl Manges OTR/L Acute Rehabilitation Services Pager: [phone] Office: [phone]

## 2018-12-05 NOTE — Progress Notes (Signed)
Inpatient Rehabilitation Admissions Coordinator  Inpatient rehab consult received. I await therapy evaluations to be able to assist with planning dispo. I will follow. Noted Hgb.  Ottie Glazier, RN, MSN Rehab Admissions Coordinator [phone] 12/05/2018 1:26 PM

## 2018-12-05 NOTE — Progress Notes (Signed)
Morphine PCA changed with Edwina Barth. NO morphine to waste from old syringe. Pt's pain 10/10, prn percocet given. Will continue to monitor.

## 2018-12-05 NOTE — Progress Notes (Addendum)
  Progress Note    12/05/2018 7:21 AM 1 Day Post-Op  Subjective:  Shooting pain L BKA stump this morning   Vitals:   12/05/18 0416 12/05/18 0540  BP: (!) 171/98 (!) 182/99  Pulse: 100   Resp: 18 (!) 24  Temp: 99.1 F (37.3 C)   SpO2: 97%     Physical Exam: Incisions:  Dressing left on; no breakthrough bleeding   CBC    Component Value Date/Time   WBC 12.2 (H) 12/05/2018 0247   RBC 2.63 (L) 12/05/2018 0247   HGB 6.9 (LL) 12/05/2018 0247   HCT 21.9 (L) 12/05/2018 0247   PLT 149 (L) 12/05/2018 0247   MCV 83.3 12/05/2018 0247   MCH 26.2 12/05/2018 0247   MCHC 31.5 12/05/2018 0247   RDW 16.4 (H) 12/05/2018 0247    BMET    Component Value Date/Time   NA 138 12/05/2018 0247   K 3.8 12/05/2018 0247   CL 104 12/05/2018 0247   CO2 24 12/05/2018 0247   GLUCOSE 128 (H) 12/05/2018 0247   BUN 14 12/05/2018 0247   CREATININE 0.81 12/05/2018 0247   CALCIUM 8.6 (L) 12/05/2018 0247   GFRNONAA >60 12/05/2018 0247   GFRAA >60 12/05/2018 0247    INR    Component Value Date/Time   INR 1.0 11/06/2018 0402     Intake/Output Summary (Last 24 hours) at 12/05/2018 0721 Last data filed at 12/05/2018 0500 Gross per 24 hour  Intake 1450.11 ml  Output 2025 ml  Net -574.89 ml     Assessment/Plan:  43 y.o. female is s/p left below knee amputation  1 Day Post-Op  - Dressing change tomorrow - Encouraged to keep knee straight - scheduled pain medicine today - Hgb 6.9; transfuse 1u pRBCs   Emilie Rutter, PA-C [facility] [phone] 12/05/2018 7:21 AM   I agree with the above.  I have seen and examined the patient.  I will add toradol for pain.  Getting 1u pRBC today for acute blood loss anemia.   Durene Cal

## 2018-12-05 NOTE — Progress Notes (Signed)
Called and Notified DR.  Edilia Bo that pt's pain is poorly controlled, after Morphine PCA and Percocet pt was still crying in pain. Received an order for 5 mg iv morphine x 1 dose. Will continue to monitor.

## 2018-12-05 NOTE — Progress Notes (Signed)
PT Cancellation Note  Patient Details Name: Debra Ball MRN: [id number] DOB: 11-Jul-1976   Cancelled Treatment:    Reason Eval/Treat Not Completed: Medical issues which prohibited therapy(Pt with Hgb 6.9 getting blood.  Pain not under control.  )   [name] 12/05/2018, 10:36 AM  [name],PT Acute Rehabilitation Services Pager:  [phone]  Office:  [phone]

## 2018-12-05 NOTE — Progress Notes (Signed)
CRITICAL VALUE ALERT  Critical Value:  Hemoglobin 6.9  Date & Time Notied:  12/05/18, 0430  Provider Notified: DR. Edilia Bo   Orders Received/Actions taken: transfuse 1 unit of packed RBC.

## 2018-12-06 ENCOUNTER — Telehealth (HOSPITAL_COMMUNITY): Payer: Self-pay

## 2018-12-06 LAB — BASIC METABOLIC PANEL
Anion gap: 11 (ref 5–15)
BUN: 12 mg/dL (ref 6–20)
CO2: 24 mmol/L (ref 22–32)
Calcium: 8.5 mg/dL — ABNORMAL LOW (ref 8.9–10.3)
Chloride: 102 mmol/L (ref 98–111)
Creatinine, Ser: 0.81 mg/dL (ref 0.44–1.00)
GFR calc Af Amer: >60 mL/min (ref >60)
GFR calc non Af Amer: >60 mL/min (ref >60)
Glucose, Bld: 112 mg/dL — ABNORMAL HIGH (ref 70–99)
Potassium: 3.6 mmol/L (ref 3.5–5.1)
Sodium: 137 mmol/L (ref 135–145)

## 2018-12-06 LAB — TYPE AND SCREEN
ABO/RH(D): B POS
Antibody Screen: NEGATIVE
Unit division: 0

## 2018-12-06 LAB — CBC
HCT: 25.4 % — ABNORMAL LOW (ref 36.0–46.0)
Hemoglobin: 8.1 g/dL — ABNORMAL LOW (ref 12.0–15.0)
MCH: 26.4 pg (ref 26.0–34.0)
MCHC: 31.9 g/dL (ref 30.0–36.0)
MCV: 82.7 fL (ref 80.0–100.0)
Platelets: 152 10*3/uL (ref 150–400)
RBC: 3.07 MIL/uL — ABNORMAL LOW (ref 3.87–5.11)
RDW: 17.4 % — ABNORMAL HIGH (ref 11.5–15.5)
WBC: 8.3 10*3/uL (ref 4.0–10.5)
nRBC: 0.4 % — ABNORMAL HIGH (ref 0.0–0.2)

## 2018-12-06 LAB — BPAM RBC
Blood Product Expiration Date: 202005212359
ISSUE DATE / TIME: 202005131010
Unit Type and Rh: 7300

## 2018-12-06 LAB — APTT: aPTT: 39 seconds — ABNORMAL HIGH (ref 24–36)

## 2018-12-06 NOTE — Evaluation (Signed)
Occupational Therapy Evaluation Patient Details Name: Debra Ball MRN: [id number] DOB: Oct 25, 1975 Today's Date: 12/06/2018    History of Present Illness 43 y.o. female who initially presented on 11/06/2018 with a left popliteal artery occlusion.  She was taken to the operating room on 11/07/2018 and underwent thrombectomy of the left popliteal, anterior tibial, and posterior tibial artery with vein patch angioplasty of the left popliteal artery. She felt a "pop" on 5/2 and is now s/p L BKA.   Clinical Impression   PTA Pt was working full time, enjoyed playing with children independent in ADL and mobility. Pt is currently mod A +2 safety for stand pivot transfers, able to perform whole body bath with mod A for back on BSC, and grooming with set up. Pt is overall mod A for LB ADL, and set up/min guard for UB ADL. She has good support system at home with husband, adult daughter, and cousin who is a CNA who works at a rehab facility. She was complaining about itching in the residual limb and so phantom pain/desensitization education initiated. She will require skilled OT in the acute setting and afterwards at the CIR level to maximize safety and independence in ADL and functional transfers as well as inititiate education for prosthetic training and training with DME (As she currently has no former experience) she is young, strong, and motivated to return to PLOF with good family support. Next session focus on continued transfer training, and LB ADL.     Follow Up Recommendations  CIR;Supervision/Assistance - 24 hour    Equipment Recommendations  3 in 1 bedside commode;Tub/shower bench;Wheelchair (measurements OT);Wheelchair cushion (measurements OT);Other (comment)(defer to next venue of care)    Recommendations for Other Services Rehab consult     Precautions / Restrictions Precautions Precautions: Fall Precaution Comments: new L BKA Restrictions Weight Bearing Restrictions: Yes LLE  Weight Bearing: Non weight bearing      Mobility Bed Mobility               General bed mobility comments: on the [facility] when OT/PT entered room  Transfers Overall transfer level: Needs assistance Equipment used: Rolling walker (2 wheeled) Transfers: Sit to/from [employer] Sit to Stand: Mod assist;+2 safety/equipment;From elevated surface Stand pivot transfers: Mod assist;+2 safety/equipment;From elevated surface       General transfer comment: mod A for verbal cues and boost from [facility], +2 for safety with initial transfer - RLE does not clear floor with pivot    Balance Overall balance assessment: Needs assistance Sitting-balance support: No upper extremity supported;Feet supported Sitting balance-Leahy Scale: Fair Sitting balance - Comments: able to perform dynamic leans on the [facility] without LOB   Standing balance support: Bilateral upper extremity supported Standing balance-Leahy Scale: Poor Standing balance comment: dependent on BUE and external assist                           ADL either performed or assessed with clinical judgement   ADL Overall ADL's : Needs assistance/impaired Eating/Feeding: Independent   Grooming: Set up;Sitting Grooming Details (indicate cue type and reason): able to perform grooming sitting on BSC Upper Body Bathing: Moderate assistance;Sitting Upper Body Bathing Details (indicate cue type and reason): mod A for back, Pt able to wash front without problems Lower Body Bathing: Min guard;Sitting/lateral leans   Upper Body Dressing : Min guard;Sitting   Lower Body Dressing: Moderate assistance;+2 for safety/equipment;Sit to/from stand   Toilet Transfer: Moderate assistance;+2 for safety/equipment;Stand-pivot;RW;BSC  Toilet Transfer Details (indicate cue type and reason): vc for safety with hand placement and sequencing with RW, assist for initial boost Toileting- Clothing Manipulation and Hygiene: Minimal  assistance;Sitting/lateral lean;Cueing for compensatory techniques Toileting - Clothing Manipulation Details (indicate cue type and reason): assist for compensatory strategies, Pt able to perform perianal care on the [facility]     Functional mobility during ADLs: Moderate assistance;+2 for safety/equipment;Rolling walker;Cueing for safety;Cueing for sequencing General ADL Comments: Pt with balance deficits as she adjusts to new weight distribution, she requires cues for compensatory strategy - but overall moves well. No previous knowledge about DME     Vision Patient Visual Report: No change from baseline       Perception     Praxis      Pertinent Vitals/Pain Pain Assessment: 0-10 Pain Score: 5  Pain Location: L residual limb Pain Descriptors / Indicators: Other (Comment);Grimacing;Operative site guarding;Sore(itching) Pain Intervention(s): Monitored during session;Repositioned;PCA encouraged     Hand Dominance Left   Extremity/Trunk Assessment Upper Extremity Assessment Upper Extremity Assessment: Overall WFL for tasks assessed   Lower Extremity Assessment Lower Extremity Assessment: LLE deficits/detail LLE Deficits / Details: new BKA LLE Sensation: (experiencing phantom pains) LLE Coordination: decreased gross motor   Cervical / Trunk Assessment Cervical / Trunk Assessment: Normal   Communication Communication Communication: No difficulties   Cognition Arousal/Alertness: Awake/alert Behavior During Therapy: WFL for tasks assessed/performed Overall Cognitive Status: No family/caregiver present to determine baseline cognitive functioning                                 General Comments: monitor, tangential conversation, when asking about home set up she said "you should call my husband"   General Comments  VSS throughout session, Pt on RA, used PCA pump once    Exercises     Shoulder Instructions      Home Living Family/patient expects to be discharged  to:: Private residence Living Arrangements: Spouse/significant other;Children Available Help at Discharge: Family;Available 24 hours/day(husband and daughter work opposite shifts) Type of Home: Mobile home Home Access: Stairs to enter [employer] of Steps: (8-9) Entrance Stairs-Rails: Right Home Layout: One level     Bathroom Shower/Tub: Chief Strategy Officer: Standard     Home Equipment: None   Additional Comments: Has a family member who is a CNA who can also assist with ADLs       Prior Functioning/Environment Level of Independence: Independent        Comments: drives, works in Engineer, [employer] (consulting)), 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, former Academic librarian (basketball and track)        OT Problem List: Decreased range of motion;Decreased activity tolerance;Impaired balance (sitting and/or standing);Decreased safety awareness;Decreased knowledge of use of DME or AE;Decreased knowledge of precautions;Cardiopulmonary status limiting activity;Pain;Increased edema      OT Treatment/Interventions: Self-care/ADL training;DME and/or AE instruction;Manual therapy;Therapeutic activities;Patient/family education;Balance training    OT Goals(Current goals can be found in the care plan section) Acute Rehab OT Goals Patient Stated Goal: "Be able to play with my kids again" OT Goal Formulation: With patient Time For Goal Achievement: 12/20/18 Potential to Achieve Goals: Good ADL Goals Pt Will Perform Grooming: Independently;sitting Pt Will Perform Lower Body Bathing: with modified independence;sitting/lateral leans Pt Will Perform Lower Body Dressing: with supervision;sitting/lateral leans Pt Will Transfer to Toilet: with supervision;ambulating Pt Will Perform Toileting - Clothing Manipulation and hygiene: with modified independence;sitting/lateral leans Pt Will Perform Tub/Shower Transfer:  Tub transfer;with supervision;with caregiver independent in assisting;tub  bench;rolling walker Additional ADL Goal #1: Pt will perform bed mobility at precursor to ADL at indpendent level  OT Frequency: Min 3X/week   Barriers to D/C:    Pt has to be able to get up 8 steps       Co-evaluation PT/OT/SLP Co-Evaluation/Treatment: Yes Reason for Co-Treatment: Complexity of the patient's impairments (multi-system involvement);To address functional/ADL transfers;For patient/therapist safety PT goals addressed during session: Mobility/safety with mobility;Balance;Proper use of DME;Strengthening/ROM OT goals addressed during session: ADL's and self-care;Proper use of Adaptive equipment and DME;Strengthening/ROM      AM-PAC OT "6 Clicks" Daily Activity     Outcome Measure Help from another person eating meals?: None Help from another person taking care of personal grooming?: A Little Help from another person toileting, which includes using toliet, bedpan, or urinal?: A Lot Help from another person bathing (including washing, rinsing, drying)?: A Little Help from another person to put on and taking off regular upper body clothing?: None Help from another person to put on and taking off regular lower body clothing?: A Lot 6 Click Score: 18   End of Session Equipment Utilized During Treatment: Gait belt;Rolling walker Nurse Communication: Mobility status;Precautions;Weight bearing status(morphine low)  Activity Tolerance: Patient tolerated treatment well Patient left: in chair;with call bell/phone within reach  OT Visit Diagnosis: Unsteadiness on feet (R26.81);Other abnormalities of gait and mobility (R26.89);Pain Pain - Right/Left: Left Pain - part of body: Leg                Time: [id number] OT Time Calculation (min): 34 min Charges:  OT General Charges $OT Visit: 1 Visit OT Evaluation $OT Eval Moderate Complexity: 1 Mod  Sherryl MangesLaura Banesa Tristan OTR/L Acute Rehabilitation Services Pager: [phone] Office: [phone]  Evern BioLaura J Mickala Laton 12/06/2018, 10:02 AM

## 2018-12-06 NOTE — Progress Notes (Signed)
[facility]  Subjective  - No complaints.   Objective (!) 145/88 99 98.4 F (36.9 C) (Oral) 18 99%  Intake/Output Summary (Last 24 hours) at 12/06/2018 1140 Last data filed at 12/06/2018 0927 Gross per 24 hour  Intake 535 ml  Output 1450 ml  Net -915 ml    L BKA incision c/d/i with staples  Laboratory Lab Results: Recent Labs    12/05/18 1857 12/06/18 0325  WBC 9.9 8.3  HGB 8.3* 8.1*  HCT 26.0* 25.4*  PLT 155 152   BMET Recent Labs    12/05/18 0247 12/06/18 0325  NA 138 137  K 3.8 3.6  CL 104 102  CO2 24 24  GLUCOSE 128* 112*  BUN 14 12  CREATININE 0.81 0.81  CALCIUM 8.6* 8.5*    COAG Lab Results  Component Value Date   INR 1.0 11/06/2018   No results found for: PTT  Assessment/Planning:  POD#2 s/p L BKA.  Dressing removed today and incision c/d/i with staples - looks good.  Will wean PCA.  PT/OT.  Appreciate rehab coordinator seeing her yesterday.  Cephus Shelling 12/06/2018 11:40 AM --

## 2018-12-06 NOTE — Progress Notes (Signed)
Morphine PCA syringe replaced at this time. Verified with Candise Bowens, RN. None to waste from previous syringe.   Ernestina Columbia, RN

## 2018-12-06 NOTE — Evaluation (Signed)
Physical Therapy Evaluation Patient Details Name: Debra Ball MRN: [id number] DOB: [birth year] Today's Date: 12/06/2018   History of Present Illness  43 y.o. female who initially presented on 11/06/2018 with a left popliteal artery occlusion.  She was taken to the operating room on 11/07/2018 and underwent thrombectomy of the left popliteal, anterior tibial, and posterior tibial artery with vein patch angioplasty of the left popliteal artery. She felt a "pop" on 5/2 and is now s/p L BKA.    Clinical Impression  Patient admitted s/p above listed procedure. Patient reports IND with mobility and ADLs prior to admission. Patient reports good support in the home environment. Patient received on [facility] with patient motivated to participate with PT/OT. Required Mod A +2 for sit to stand and stand pivot transfers with use of RW. Noted difficulty with R foot clearance during transfer with tendency to slide foot along floor. Will currently recommend CIR level therapies at discharge as patient was IND prior to admission and now requires physical assist for functional mobility. PT to follow acutely to progress functional mobility, safety, balance, strength.     Follow Up Recommendations CIR    Equipment Recommendations  Other (comment)(TBD)    Recommendations for Other Services Rehab consult     Precautions / Restrictions Precautions Precautions: Fall Precaution Comments: new L BKA Restrictions Weight Bearing Restrictions: Yes LLE Weight Bearing: Non weight bearing      Mobility  Bed Mobility               General bed mobility comments: on the [facility] when OT/PT entered room  Transfers Overall transfer level: Needs assistance Equipment used: Rolling walker (2 wheeled) Transfers: Sit to/from [employer] Sit to Stand: Mod assist;+2 safety/equipment;From elevated surface Stand pivot transfers: Mod assist;+2 safety/equipment;From elevated surface       General  transfer comment: mod A for verbal cues and boost from [facility], +2 for safety with initial transfer - RLE does not clear floor with pivot  Ambulation/Gait                Stairs            Wheelchair Mobility    Modified Rankin (Stroke Patients Only)       Balance Overall balance assessment: Needs assistance Sitting-balance support: No upper extremity supported;Feet supported Sitting balance-Leahy Scale: Fair Sitting balance - Comments: able to perform dynamic leans on the [facility] without LOB   Standing balance support: Bilateral upper extremity supported Standing balance-Leahy Scale: Poor Standing balance comment: dependent on BUE and external assist                             Pertinent Vitals/Pain Pain Assessment: 0-10 Pain Score: 5  Pain Location: L residual limb Pain Descriptors / Indicators: Other (Comment);Grimacing;Operative site guarding;Sore(itching) Pain Intervention(s): Limited activity within patient's tolerance;Monitored during session;Repositioned;PCA encouraged    Home Living Family/patient expects to be discharged to:: Private residence Living Arrangements: Spouse/significant other;Children Available Help at Discharge: Family;Available 24 hours/day(husband and daughter work opposite shifts) Type of Home: Mobile home Home Access: Stairs to enter Entrance Stairs-Rails: Right Entrance Stairs-Number of Steps: (8-9) Home Layout: One level Home Equipment: None Additional Comments: Has a family member who is a CNA who can also assist with ADLs     Prior Function Level of Independence: Independent         Comments: drives, works in Engineer, [employer] (consulting)), 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, former Academic librarian (  basketball and track)     Hand Dominance   Dominant Hand: Left    Extremity/Trunk Assessment   Upper Extremity Assessment Upper Extremity Assessment: Defer to OT evaluation    Lower Extremity Assessment Lower Extremity  Assessment: LLE deficits/detail LLE Deficits / Details: new BKA LLE Sensation: (experiencing phantom pains) LLE Coordination: decreased gross motor    Cervical / Trunk Assessment Cervical / Trunk Assessment: Normal  Communication   Communication: No difficulties  Cognition Arousal/Alertness: Awake/alert Behavior During Therapy: WFL for tasks assessed/performed Overall Cognitive Status: No family/caregiver present to determine baseline cognitive functioning                                 General Comments: monitor, tangential conversation, when asking about home set up she said "you should call my husband"      General Comments General comments (skin integrity, edema, etc.): VSS throughout session, Pt on RA, used PCA x1    Exercises     Assessment/Plan    PT Assessment Patient needs continued PT services  PT Problem List Decreased strength;Decreased range of motion;Decreased activity tolerance;Decreased balance;Decreased mobility;Decreased knowledge of use of DME;Decreased safety awareness       PT Treatment Interventions DME instruction;Gait training;Stair training;Functional mobility training;Therapeutic activities;Therapeutic exercise;Balance training;Patient/family education    PT Goals (Current goals can be found in the Care Plan section)  Acute Rehab PT Goals Patient Stated Goal: "Be able to play with my kids again" PT Goal Formulation: With patient Time For Goal Achievement: 12/20/18 Potential to Achieve Goals: Good    Frequency Min 4X/week   Barriers to discharge        Co-evaluation PT/OT/SLP Co-Evaluation/Treatment: Yes Reason for Co-Treatment: Complexity of the patient's impairments (multi-system involvement);For patient/therapist safety;To address functional/ADL transfers PT goals addressed during session: Mobility/safety with mobility;Balance;Proper use of DME;Strengthening/ROM OT goals addressed during session: ADL's and self-care;Proper  use of Adaptive equipment and DME;Strengthening/ROM       AM-PAC PT "6 Clicks" Mobility  Outcome Measure Help needed turning from your back to your side while in a flat bed without using bedrails?: A Lot Help needed moving from lying on your back to sitting on the side of a flat bed without using bedrails?: A Lot Help needed moving to and from a bed to a chair (including a wheelchair)?: A Lot Help needed standing up from a chair using your arms (e.g., wheelchair or bedside chair)?: A Lot Help needed to walk in hospital room?: Total Help needed climbing 3-5 steps with a railing? : Total 6 Click Score: 10    End of Session Equipment Utilized During Treatment: Gait belt Activity Tolerance: Patient tolerated treatment well Patient left: in chair;with call bell/phone within reach;with chair alarm set Nurse Communication: Mobility status PT Visit Diagnosis: Other abnormalities of gait and mobility (R26.89);Unsteadiness on feet (R26.81);Muscle weakness (generalized) (M62.81)    Time: [id number] PT Time Calculation (min) (ACUTE ONLY): 34 min   Charges:   PT Evaluation $PT Eval Moderate Complexity: 1 Mod          Kipp LaurenceStephanie R Kenadee Gates, PT, DPT Supplemental Physical Therapist 12/06/18 11:17 AM Pager: [phone] Office: [phone]

## 2018-12-06 NOTE — Progress Notes (Signed)
Inpatient Rehabilitation Admissions Coordinator  Inpatient Rehab Consult received. I met with patient at the bedside for rehabilitation assessment. We discussed goals and expectations of an inpatient rehab admission.  She prefers an inpt rehab admit and I made her aware she would have to wean off the PCA before admit. I will begin insurance authorization with [employer] of [place], contact her spouse to discuss and follow up tomorrow for possible admit pending insurance approval and medical readiness to admit.  Danne Baxter, RN, MSN Rehab Admissions Coordinator [phone] 12/06/2018 12:38 PM

## 2018-12-06 NOTE — Telephone Encounter (Signed)
No answer. Left message on voicemail along with PV Navigator contact info again.

## 2018-12-06 NOTE — H&P (Signed)
Physical Medicine and Rehabilitation Admission H&P    Chief complaint: Stump pain HPI: Debra DeputyMelanie Ball is a 43 year old right-handed female with long history of peripheral vascular disease left popliteal artery occlusion underwent thrombectomy 11/07/2018.  She was discharged home on Eliquis.  Per chart review patient lives with spouse and her children.  1 level home with 8 steps to entry.  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  [facility]2018 to [facility] department with increasing pain to her left foot.  She no longer had palpable pulses and was transferred to [facility].  Underwent redo left below-knee popliteal artery exposure thrombectomy of left superficial femoral-popliteal anterior tibial and posterior tibial artery 12/01/2018 per Dr. Myra GianottiBrabham.  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 Edilia Boickson.  Currently on subcutaneous heparin for DVT prophylaxis.  Acute blood loss anemia 6.9 patient has been transfused with latest hemoglobin 8.1.  Therapy evaluations completed and patient was admitted for a comprehensive rehab program  Review of Systems  Constitutional: Negative for chills and fever.  HENT: Negative for hearing loss.   Eyes: Negative for blurred vision and double vision.  Respiratory: Negative for shortness of breath.   Cardiovascular: Positive for leg swelling. Negative for chest pain and palpitations.  Gastrointestinal: Positive for constipation. Negative for heartburn, nausea and vomiting.  Genitourinary: Negative for dysuria, flank pain and hematuria.  Musculoskeletal: Positive for myalgias.  Skin: Negative for rash.  Neurological: Negative for seizures.  All other systems reviewed and are negative.  Past Medical History:  Diagnosis Date  . Pregnancy induced hypertension    Past Surgical History:  Procedure Laterality Date  . AMPUTATION Left 12/04/2018   Procedure: LEFT  BELOW KNEE AMPUTATION;  Surgeon: Chuck Hintickson, Christopher S, MD;  [facility];  Service: Vascular;  Laterality: Left;  . AORTOGRAM Left 12/01/2018   Procedure: Abdominal Aortogram with left leg runoff;  Surgeon: Nada LibmanBrabham, Vance W, MD;  [facility];  Service: Vascular;  Laterality: Left;  . FEMORAL-TIBIAL BYPASS GRAFT Left 11/07/2018   Procedure: THROMBECTOMY POPLITEAL, ANTERIOR TIBIAL ARTERY, POSTERIOR TIBIAL ARTERY, AND VEIN PATCH ANTERIOR ARTERY;  Surgeon: Nada LibmanBrabham, Vance W, MD;  Location: [facility] OR;  Service: Vascular;  Laterality: Left;  . LOWER EXTREMITY ANGIOGRAM Left 12/02/2018   Procedure: Left Lower Extremity Angiogram;  Surgeon: Nada LibmanBrabham, Vance W, MD;  [facility];  Service: Vascular;  Laterality: Left;  . LOWER EXTREMITY ANGIOGRAPHY Bilateral 11/06/2018   Procedure: LOWER EXTREMITY ANGIOGRAPHY;  Surgeon: Nada LibmanBrabham, Vance W, MD;  Location: [facility] INVASIVE CV LAB;  Service: Cardiovascular;  Laterality: Bilateral;  And ABD   . PATCH ANGIOPLASTY Left 12/01/2018   Procedure: Patch Angioplasty with xenosure Biological patch on Left tibia fibula trunk;  Surgeon: Nada LibmanBrabham, Vance W, MD;  [facility];  Service: Vascular;  Laterality: Left;  . TEE WITHOUT CARDIOVERSION  12/01/2018   Procedure: Transesophageal Echocardiogram (Tee);  Surgeon: Nada LibmanBrabham, Vance W, MD;  [facility];  Service: Vascular;;  . THROMBECTOMY FEMORAL ARTERY Left 12/01/2018   Procedure: Thrombectomy left leg, SFA, Popliteal artery, posterior tibial artery, and anterior itbial artery, Redo popliteal exposure, posterior tibia exposure;  Surgeon: Nada LibmanBrabham, Vance W, MD;  Location: [facility] OR;  Service: Vascular;  Laterality: Left;  . THROMBECTOMY FEMORAL ARTERY Left 12/02/2018   Procedure: Thrombectomy left leg, SFA, Popliteal artery, posterior tibial artery, and anterior tibial artery, Redo popliteal exposure, posterior tibia exposure, Patch Angioplasty with xenosure Biological patch on Left tibia fibula  trunk, Abdominal Aortogram with left leg  runoff;  Surgeon: Nada Libman, MD;  Location: [facility] OR;  Service: Vascular;  Laterality: Left;  . THROMBECTOMY FEMORAL ARTERY  12/02/2018   Procedure: Thrombectomy of left SFA, popliteal artery, posterior tibial artery, and anterior tibial artery;  Surgeon: Nada Libman, MD;  Location: [facility] OR;  Service: Vascular;;   History reviewed. No pertinent family history. Social History:  reports that she has been smoking cigarettes. She has never used smokeless tobacco. She reports current alcohol use. She reports that she does not use drugs. Allergies:  Allergies  Allergen Reactions  . Hydrocodone Bitartrate Er Hives   Medications Prior to Admission  Medication Sig Dispense Refill  . acetaminophen (TYLENOL) 650 MG CR tablet Take 1,300 mg by mouth every 8 (eight) hours as needed for pain.    Marland Kitchen apixaban (ELIQUIS) 5 MG TABS tablet Take 2 tablets (10 mg total) by mouth 2 (two) times daily. (Patient taking differently: Take 5 mg by mouth 2 (two) times daily. ) 12 tablet 0  . traMADol (ULTRAM) 50 MG tablet Take 1 tablet (50 mg total) by mouth every 6 (six) hours as needed. (Patient taking differently: Take 50 mg by mouth every 6 (six) hours as needed for moderate pain. ) 30 tablet 0    Drug Regimen Review Drug regimen was reviewed and remains appropriate with no significant issues identified  Home: Home Living Family/patient expects to be discharged to:: Private residence Living Arrangements: Spouse/significant other, Children Available Help at Discharge: Family, Available 24 hours/day(husband and daughter work opposite shifts) Type of Home: Mobile home Home Access: Stairs to enter Secretary/administrator of Steps: (8-9) Entrance Stairs-Rails: Right Home Layout: One level Bathroom Shower/Tub: Engineer, manufacturing systems: Standard Home Equipment: None Additional Comments: Has a family member who is a CNA who can also assist with ADLs    Functional History: Prior Function Level of  Independence: Independent Comments: drives, works in Engineer, civil (consulting)), 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, former Academic librarian (basketball and track)  Functional Status:  Mobility: Bed Mobility General bed mobility comments: on the [facility] when OT/PT entered room Transfers Overall transfer level: Needs assistance Equipment used: Rolling walker (2 wheeled) Transfers: Sit to/from Stand, [employer] Transfers Sit to Stand: Mod assist, +2 safety/equipment, From elevated surface Stand pivot transfers: Mod assist, +2 safety/equipment, From elevated surface General transfer comment: mod A for verbal cues and boost from [facility], +2 for safety with initial transfer - RLE does not clear floor with pivot      ADL: ADL Overall ADL's : Needs assistance/impaired Eating/Feeding: Independent Grooming: Set up, Sitting Grooming Details (indicate cue type and reason): able to perform grooming sitting on BSC Upper Body Bathing: Moderate assistance, Sitting Upper Body Bathing Details (indicate cue type and reason): mod A for back, Pt able to wash front without problems Lower Body Bathing: Min guard, Sitting/lateral leans Upper Body Dressing : Min guard, Sitting Lower Body Dressing: Moderate assistance, +2 for safety/equipment, Sit to/from stand Toilet Transfer: Moderate assistance, +2 for safety/equipment, Stand-pivot, RW, BSC Toilet Transfer Details (indicate cue type and reason): vc for safety with hand placement and sequencing with RW, assist for initial boost Toileting- Clothing Manipulation and Hygiene: Minimal assistance, Sitting/lateral lean, Cueing for compensatory techniques Toileting - Clothing Manipulation Details (indicate cue type and reason): assist for compensatory strategies, Pt able to perform perianal care on the [facility] Functional mobility during ADLs: Moderate assistance, +2 for safety/equipment, Rolling walker, Cueing for safety, Cueing for sequencing General ADL Comments:  Pt with balance  deficits as she adjusts to new weight distribution, she requires cues for compensatory strategy - but overall moves well. No previous knowledge about DME  Cognition: Cognition Overall Cognitive Status: No family/caregiver present to determine baseline cognitive functioning Orientation Level: Oriented X4 Cognition Arousal/Alertness: Awake/alert Behavior During Therapy: WFL for tasks assessed/performed Overall Cognitive Status: No family/caregiver present to determine baseline cognitive functioning General Comments: monitor, tangential conversation, when asking about home set up she said "you should call my husband"  Physical Exam: Blood pressure (!) 125/55, pulse 92, temperature 99.6 F (37.6 C), temperature source Oral, resp. rate 14, height 5\' 1"  (1.549 m), weight 82.3 kg, last menstrual period 11/26/2018, SpO2 98 %. Physical Exam  Constitutional: She is oriented to person, place, and time. She appears well-developed and well-nourished. No distress.  HENT:  Head: Normocephalic.  Eyes: Pupils are equal, round, and reactive to light. EOM are normal.  Neck: Normal range of motion. No thyromegaly present.  Cardiovascular: Normal rate and regular rhythm.  Respiratory: Effort normal. No respiratory distress. She has no wheezes.  GI: Soft. She exhibits no distension. There is no abdominal tenderness.  Musculoskeletal:     Comments: LLE dressed, edematous and tender. Unable to fully extend leg.   Neurological: She is alert and oriented to person, place, and time. No cranial nerve deficit. Coordination normal.  UE motor 5/5. LLE--able to lift leg against gravity, can't extend kne fully. RLE: 4/5 prox to distal. Senses pain in all 4's.   Skin: Skin is warm.  BKA site is dressed appropriately tender  Psychiatric: She has a normal mood and affect. Her behavior is normal.    Results for orders placed or performed during the hospital encounter of 12/01/18 (from the past 48 hour(s))  CBC      Status: Abnormal   Collection Time: 12/05/18  6:57 PM  Result Value Ref Range   WBC 9.9 4.0 - 10.5 K/uL   RBC 3.14 (L) 3.87 - 5.11 MIL/uL   Hemoglobin 8.3 (L) 12.0 - 15.0 g/dL   HCT 71.0 (L) 62.6 - 94.8 %   MCV 82.8 80.0 - 100.0 fL   MCH 26.4 26.0 - 34.0 pg   MCHC 31.9 30.0 - 36.0 g/dL   RDW 54.6 (H) 27.0 - 35.0 %   Platelets 155 150 - 400 K/uL   nRBC 0.2 0.0 - 0.2 %    Comment: Performed at [facility], [address], [place], [place] [address]  APTT     Status: Abnormal   Collection Time: 12/06/18  3:25 AM  Result Value Ref Range   aPTT 39 (H) 24 - 36 seconds    Comment:        IF BASELINE aPTT IS ELEVATED, SUGGEST PATIENT RISK ASSESSMENT BE USED TO DETERMINE APPROPRIATE ANTICOAGULANT THERAPY. Performed at [facility], [address] [place], [place] [address]   Basic metabolic panel     Status: Abnormal   Collection Time: 12/06/18  3:25 AM  Result Value Ref Range   Sodium 137 135 - 145 mmol/L   Potassium 3.6 3.5 - 5.1 mmol/L   Chloride 102 98 - 111 mmol/L   CO2 24 22 - 32 mmol/L   Glucose, Bld 112 (H) 70 - 99 mg/dL   BUN 12 6 - 20 mg/dL   Creatinine, Ser 7.16 0.44 - 1.00 mg/dL   Calcium 8.5 (L) 8.9 - 10.3 mg/dL   GFR calc non Af Amer >60 >60 mL/min   GFR  calc Af Amer >60 >60 mL/min   Anion gap 11 5 - 15    Comment: Performed at [facility], [address], [place], [place] [address]  CBC     Status: Abnormal   Collection Time: 12/06/18  3:25 AM  Result Value Ref Range   WBC 8.3 4.0 - 10.5 K/uL   RBC 3.07 (L) 3.87 - 5.11 MIL/uL   Hemoglobin 8.1 (L) 12.0 - 15.0 g/dL   HCT 19.1 (L) 47.8 - 29.5 %   MCV 82.7 80.0 - 100.0 fL   MCH 26.4 26.0 - 34.0 pg   MCHC 31.9 30.0 - 36.0 g/dL   RDW 62.1 (H) 30.8 - 65.7 %   Platelets 152 150 - 400 K/uL   nRBC 0.4 (H) 0.0 - 0.2 %    Comment: Performed at [facility], [address], [place], [place] [address]  APTT     Status: None   Collection Time: 12/07/18  5:22 AM  Result Value Ref Range   aPTT  35 24 - 36 seconds    Comment: Performed at [facility], [address] [place], [place] [address]   No results found.     Medical Problem List and Plan: 1.  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  -pre-prosthetic education 2.  Antithrombotics: -DVT/anticoagulation: Subcutaneous heparin  -antiplatelet therapy: N/A 3. Pain Management: Oxycodone as needed  -robaxin prn spasms  -massage and sensory feedback 4. Mood: Xanax 0.25-0.5 mg 3 times daily as needed  -antipsychotic agents: N/A 5. Neuropsych: This patient is capable of making decisions on her own behalf. 6. Skin/Wound Care: Routine skin checks  -ACE and dry dressing to stump  -convert to shrinker next week potentially 7. Fluids/Electrolytes/Nutrition: Routine in and outs with follow-up chemistries  -encourage PO 8.  Acute blood loss anemia.  Follow-up CBC 9.  Constipation.  Laxative assistance      Charlton Amor, PA-C 12/07/2018

## 2018-12-06 NOTE — PMR Pre-admission (Signed)
PMR Admission Coordinator Pre-Admission Assessment  Patient: Debra Ball is an 43 y.o., female MRN: [id number] DOB: 12-16-1975 Height: 5' 1" (154.9 cm) Weight: 82.3 kg  Insurance Information HMO:     PPO: yes     PCP:      IPA:      80/20:      OTHER:  PRIMARY: [employer] [place]      Policy#: [id number]      Subscriber: pt CM Name: Wells Guiles      Phone#: [phone]     Fax#: [phone] Pre-Cert#: [id number] updates due 5/20    Employer: [employer] Benefits:  Phone #: [phone]     Name: 5/14 Eff. Date: 07/25/2018     Deduct: $1750/met      Out of Pocket Max: $5500 includes deductible/met      Life Max: none CIR: 70%      SNF: 70% 100 days Outpatient: 70%     Co-Pay: 30 visits combined Home Health: 70%      Co-Pay: 10 visits DME: 70%     Co-Pay: 30% Providers: in network  SECONDARY: none/ Medicaid not active  Medicaid Application Date:       Case Manager:  Disability Application Date:       Case Worker:   The "Data Collection Information Summary" for patients in Inpatient Rehabilitation Facilities with attached "Privacy Act [place] Records" was provided and verbally reviewed with: N/A  Emergency Contact Information Contact Information    Name Relation Home Work Mobile   [facility] Spouse [phone]        Current Medical History  Patient Admitting Diagnosis:  Left BKA  History of Present Illness:  43 year old right-handed female with long history of peripheral vascular disease left popliteal artery occlusion underwent thrombectomy 11/07/2018.  She was discharged home on Eliquis.  She had been working in a [employer].  Presented 12/01/2018 to [facility] emergency department with increasing pain to her left foot.  She no longer had palpable pulses and was transferred to [facility].  Underwent redo left below-knee popliteal artery exposure thrombectomy of left superficial femoral-popliteal anterior tibial and posterior tibial artery 12/01/2018 per  Dr. Trula Slade.  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 Scot Dock.  Currently on subcutaneous heparin for DVT prophylaxis.  Acute blood loss anemia 6.9 patient has been transfused with latest hemoglobin 8.1.     Patient's medical record from [facility]  has been reviewed by the rehabilitation admission coordinator and physician.  Past Medical History  Past Medical History:  Diagnosis Date  . Pregnancy induced hypertension     Family History   family history is not on file.  Prior Rehab/Hospitalizations Has the patient had prior rehab or hospitalizations prior to admission? No  Has the patient had major surgery during 100 days prior to admission? Yes   Current Medications  Current Facility-Administered Medications:  .  0.9 %  sodium chloride infusion (Manually program via Guardrails IV Fluids), , Intravenous, Once, Eveland, Matthew, PA-C .  0.9 %  sodium chloride infusion (Manually program via Guardrails IV Fluids), , Intravenous, Once, Angelia Mould, MD .  acetaminophen (TYLENOL) tablet 325-650 mg, 325-650 mg, Oral, Q4H PRN, 650 mg at 12/07/18 0957 **OR** acetaminophen (TYLENOL) suppository 325-650 mg, 325-650 mg, Rectal, Q4H PRN, Dagoberto Ligas, PA-C .  ALPRAZolam Duanne Moron) tablet 0.25-0.5 mg, 0.25-0.5 mg, Oral, TID PRN, Dagoberto Ligas, PA-C, 0.5 mg at 12/03/18 1451 .  alum & mag hydroxide-simeth (MAALOX/MYLANTA) 200-200-20 MG/5ML suspension 15-30 mL, 15-30 mL, Oral, Q2H PRN, Eveland, Matthew, PA-C .  docusate sodium (COLACE) capsule 100 mg, 100 mg, Oral, Daily, Dagoberto Ligas, PA-C, 100 mg at 12/07/18 0957 .  guaiFENesin-dextromethorphan (ROBITUSSIN DM) 100-10 MG/5ML syrup 15 mL, 15 mL, Oral, Q4H PRN, Dagoberto Ligas, PA-C .  heparin injection 5,000 Units, 5,000 Units, Subcutaneous, Q8H, Eveland, Matthew, PA-C, 5,000 Units at 12/07/18 0600 .  hydrALAZINE (APRESOLINE) injection 5 mg,  5 mg, Intravenous, Q20 Min PRN, Eveland, Matthew, PA-C .  ketorolac (TORADOL) 30 MG/ML injection 30 mg, 30 mg, Intravenous, Q6H, Serafina Mitchell, MD, 30 mg at 12/07/18 0600 .  magnesium sulfate IVPB 2 g 50 mL, 2 g, Intravenous, Daily PRN, Dagoberto Ligas, PA-C .  metoprolol tartrate (LOPRESSOR) injection 2-5 mg, 2-5 mg, Intravenous, Q2H PRN, Eveland, Matthew, PA-C .  ondansetron (ZOFRAN) injection 4 mg, 4 mg, Intravenous, Q6H PRN, Dagoberto Ligas, PA-C .  oxyCODONE-acetaminophen (PERCOCET/ROXICET) 5-325 MG per tablet 1-2 tablet, 1-2 tablet, Oral, Q4H PRN, Dagoberto Ligas, PA-C, 2 tablet at 12/05/18 1202 .  pantoprazole (PROTONIX) EC tablet 40 mg, 40 mg, Oral, Daily, Dagoberto Ligas, PA-C, 40 mg at 12/07/18 0957 .  phenol (CHLORASEPTIC) mouth spray 1 spray, 1 spray, Mouth/Throat, PRN, Eveland, Matthew, PA-C .  polyethylene glycol (MIRALAX / GLYCOLAX) packet 17 g, 17 g, Oral, Daily PRN, Eveland, Matthew, PA-C .  potassium chloride SA (K-DUR) CR tablet 20-40 mEq, 20-40 mEq, Oral, Daily PRN, Dagoberto Ligas, PA-C  Patients Current Diet:  Diet Order            Diet regular Room service appropriate? Yes; Fluid consistency: Thin  Diet effective now              Precautions / Restrictions Precautions Precautions: Fall Precaution Comments: new L BKA Restrictions Weight Bearing Restrictions: Yes LLE Weight Bearing: Non weight bearing   Has the patient had 2 or more falls or a fall with injury in the past year? No  Prior Activity Level Community (5-7x/wk): active and working  Prior Functional Level Self Care: Did the patient need help bathing, dressing, using the toilet or eating? Independent  Indoor Mobility: Did the patient need assistance with walking from room to room (with or without device)? Independent  Stairs: Did the patient need assistance with internal or external stairs (with or without device)? Independent  Functional Cognition: Did the patient need help planning  regular tasks such as shopping or remembering to take medications? Independent  Home Assistive Devices / Equipment Home Assistive Devices/Equipment: None Home Equipment: None  Prior Device Use: Indicate devices/aids used by the patient prior to current illness, exacerbation or injury? None of the above  Current Functional Level Cognition  Overall Cognitive Status: No family/caregiver present to determine baseline cognitive functioning Orientation Level: Oriented X4 General Comments: Pt appears to be slow to process commands    Extremity Assessment (includes Sensation/Coordination)  Upper Extremity Assessment: Defer to OT evaluation  Lower Extremity Assessment: LLE deficits/detail LLE Deficits / Details: new BKA LLE Sensation: (experiencing phantom pains) LLE Coordination: decreased gross motor    ADLs  Overall ADL's : Needs assistance/impaired Eating/Feeding: Independent Grooming: Set up, Sitting Grooming Details (indicate cue type and reason): able to perform grooming sitting on BSC Upper Body Bathing: Moderate assistance, Sitting Upper Body Bathing Details (indicate cue type and reason): mod A for back, Pt able to wash front without problems Lower Body Bathing: Min guard, Sitting/lateral leans Upper Body Dressing : Min guard, Sitting Lower Body  Dressing: Moderate assistance, +2 for safety/equipment, Sit to/from stand Toilet Transfer: Moderate assistance, +2 for safety/equipment, Stand-pivot, RW, BSC Toilet Transfer Details (indicate cue type and reason): vc for safety with hand placement and sequencing with RW, assist for initial boost Toileting- Clothing Manipulation and Hygiene: Minimal assistance, Sitting/lateral lean, Cueing for compensatory techniques Toileting - Clothing Manipulation Details (indicate cue type and reason): assist for compensatory strategies, Pt able to perform perianal care on the [facility] Functional mobility during ADLs: Moderate assistance, +2 for  safety/equipment, Rolling walker, Cueing for safety, Cueing for sequencing General ADL Comments: Pt with balance deficits as she adjusts to new weight distribution, she requires cues for compensatory strategy - but overall moves well. No previous knowledge about DME    Mobility  Overal bed mobility: Needs Assistance Bed Mobility: Supine to Sit Supine to sit: Min assist General bed mobility comments: Pt required assistance to scoot to edge of bed but able to elevate trunk into sitting without external assistance.     Transfers  Overall transfer level: Needs assistance Equipment used: Rolling walker (2 wheeled) Transfers: Sit to/from Stand Sit to Stand: Min assist, Mod assist, +2 safety/equipment Stand pivot transfers: Mod assist, +2 safety/equipment, From elevated surface General transfer comment: Cues for hand placement to and from seated surface.  Pt required increased assistance to maintain standing to moderate assistance when performing peri-care.      Ambulation / Gait / Stairs / Wheelchair Mobility  Ambulation/Gait Ambulation/Gait assistance: Min assist, +2 safety/equipment Gait Distance (Feet): 6 Feet(2nd trial of 20 ft, 3rd trial of 30 ft and 4th trial of 10 ft.  Close chair follow for all trials.  ) Assistive device: Rolling walker (2 wheeled) Gait Pattern/deviations: Step-to pattern, Trunk flexed(Hop to pattern.  ) General Gait Details: Cues for Upper extremity use to elevate R foot to progress step forward.  Pt slow and guarded but motivated to progress.  Short trial performed with mutliple rest breaks due to fatigue.      Posture / Balance Dynamic Sitting Balance Sitting balance - Comments: able to perform dynamic leans on the [facility] without LOB Balance Overall balance assessment: Needs assistance Sitting-balance support: No upper extremity supported, Feet supported Sitting balance-Leahy Scale: Fair Sitting balance - Comments: able to perform dynamic leans on the [facility] without  LOB Standing balance support: Bilateral upper extremity supported Standing balance-Leahy Scale: Poor Standing balance comment: dependent on BUE and external assist    Special needs/care consideration BiPAP/CPAP  N/a CPM  N/a Continuous Drip IV  N/a Dialysis  N/a Life Vest  N/a Oxygen  N/a Special Bed  N/a Trach Size  N/a Wound Vac n/a Skin  BKA surgical incision; right groin incision Bowel mgmt:  Continent LBM 5/11 Bladder mgmt:  continent Diabetic mgmt:  N/a Behavioral consideration  N/a Chemo/radiation  N/a   Previous Home Environment  Living Arrangements: Spouse/significant other, Children Available Help at Discharge: Family, Available 24 hours/day(husband and daughter work opposite shifts) Type of Home: Mobile home Home Layout: One level Home Access: Stairs to enter Entrance Stairs-Rails: Right Entrance Stairs-Number of Steps: 8 to 9 Bathroom Shower/Tub: Tub/shower unit, Architectural technologist: Programmer, systems: Yes How Accessible: Accessible via walker Home Care Services: No Additional Comments: Has a family member who is a CNA who can also assist with ADLs   Discharge Living Setting Plans for Discharge Living Setting: Patient's home, Mobile Home, Lives with (comment)(spouse and 78 year old daughter) Type of Home at Discharge: Mobile home Discharge Home Layout: One level Discharge  Home Access: Stairs to enter Entrance Stairs-Rails: Right Entrance Stairs-Number of Steps: 8 to 9 Discharge Bathroom Shower/Tub: Tub/shower unit, Curtain Discharge Bathroom Toilet: Standard Discharge Bathroom Accessibility: Yes How Accessible: Accessible via walker Does the patient have any problems obtaining your medications?: No  Social/Family/Support Systems Patient Roles: Spouse, Parent(employee) Contact Information: spouse Gerrit Friends Anticipated Caregiver: spouse and daughter Anticipated Ambulance person Information: see above Ability/Limitations of Caregiver:  daughter and spouse to rearrange schedules to work opposite each other Caregiver Availability: 24/7 Discharge Plan Discussed with Primary Caregiver: Yes Is Caregiver In Agreement with Plan?: Yes Does Caregiver/Family have Issues with Lodging/Transportation while Pt is in Rehab?: No  Goals/Additional Needs Patient/Family Goal for Rehab: Mod I with PT and OT w/c level Expected length of stay: ELOS 7 to 10 days Additional Information: will need ramp Pt/Family Agrees to Admission and willing to participate: Yes Program Orientation Provided & Reviewed with Pt/Caregiver Including Roles  & Responsibilities: Yes  Decrease burden of Care through IP rehab admission: n/a  Possible need for SNF placement upon discharge:  N/a  Patient Condition: I have reviewed medical records from [facility] , spoken with CM, and patient. I met with patient at the bedside for inpatient rehabilitation assessment.  Patient will benefit from ongoing PT and OT, can actively participate in 3 hours of therapy a day 5 days of the week, and can make measurable gains during the admission.  Patient will also benefit from the coordinated team approach during an Inpatient Acute Rehabilitation admission.  The patient will receive intensive therapy as well as Rehabilitation physician, nursing, social worker, and care management interventions.  Due to bladder management, bowel management, safety, skin/wound care, disease management, medication administration, pain management and patient education the patient requires 24 hour a day rehabilitation nursing.  The patient is currently min to mod assist with mobility and basic ADLs.  Discharge setting and therapy post discharge at home with home health is anticipated.  Patient has agreed to participate in the Acute Inpatient Rehabilitation Program and will admit today.  Preadmission Screen Completed By:  Cleatrice Burke RN MSN, 12/07/2018 3:23  PM ______________________________________________________________________   Discussed status with Dr. Naaman Plummer  on  12/07/2018 at  1523 and received approval for admission today.  Admission Coordinator:  Cleatrice Burke, RN MSN, time  7893 Date  12/07/2018   Assessment/Plan: Diagnosis: left BKA 1. Does the need for close, 24 hr/day Medical supervision in concert with the patient's rehab needs make it unreasonable for this patient to be served in a less intensive setting? Yes 2. Co-Morbidities requiring supervision/potential complications: PAD, pain mgt 3. Due to bladder management, bowel management, safety, skin/wound care, disease management, medication administration, pain management and patient education, does the patient require 24 hr/day rehab nursing? Yes 4. Does the patient require coordinated care of a physician, rehab nurse, PT (1-2 hrs/day, 5 days/week) and OT (1-2 hrs/day, 5 days/week) to address physical and functional deficits in the context of the above medical diagnosis(es)? Yes Addressing deficits in the following areas: balance, endurance, locomotion, strength, transferring, bowel/bladder control, bathing, dressing, feeding, grooming, toileting and psychosocial support 5. Can the patient actively participate in an intensive therapy program of at least 3 hrs of therapy 5 days a week? Yes 6. The potential for patient to make measurable gains while on inpatient rehab is excellent 7. Anticipated functional outcomes upon discharge from inpatients are: modified independent PT, modified independent OT, n/a SLP 8. Estimated rehab length of stay to reach the above functional goals  is: 5-7 days 9. Anticipated D/C setting: Home 10. Anticipated post D/C treatments: Philip therapy 11. Overall Rehab/Functional Prognosis: excellent  MD Signature: Meredith Staggers, MD, [facility] 12/07/2018

## 2018-12-07 ENCOUNTER — Inpatient Hospital Stay (HOSPITAL_COMMUNITY)
Admission: RE | Admit: 2018-12-07 | Discharge: 2018-12-15 | DRG: 560 | Disposition: A | Discharge status: Home Health | Payer: BLUE CROSS/BLUE SHIELD | Source: Intra-hospital | Attending: Physical Medicine & Rehabilitation | Admitting: Physical Medicine & Rehabilitation

## 2018-12-07 ENCOUNTER — Other Ambulatory Visit: Payer: Self-pay

## 2018-12-07 ENCOUNTER — Encounter (HOSPITAL_COMMUNITY): Payer: Self-pay

## 2018-12-07 DIAGNOSIS — Z7901 Long term (current) use of anticoagulants: Secondary | ICD-10-CM | POA: Diagnosis not present

## 2018-12-07 DIAGNOSIS — I7090 Unspecified atherosclerosis: Secondary | ICD-10-CM | POA: Diagnosis not present

## 2018-12-07 DIAGNOSIS — G8918 Other acute postprocedural pain: Secondary | ICD-10-CM | POA: Diagnosis not present

## 2018-12-07 DIAGNOSIS — I10 Essential (primary) hypertension: Secondary | ICD-10-CM | POA: Diagnosis not present

## 2018-12-07 DIAGNOSIS — Z79891 Long term (current) use of opiate analgesic: Secondary | ICD-10-CM

## 2018-12-07 DIAGNOSIS — I739 Peripheral vascular disease, unspecified: Secondary | ICD-10-CM

## 2018-12-07 DIAGNOSIS — D62 Acute posthemorrhagic anemia: Secondary | ICD-10-CM | POA: Diagnosis present

## 2018-12-07 DIAGNOSIS — K5903 Drug induced constipation: Secondary | ICD-10-CM | POA: Diagnosis not present

## 2018-12-07 DIAGNOSIS — I70202 Unspecified atherosclerosis of native arteries of extremities, left leg: Secondary | ICD-10-CM | POA: Diagnosis present

## 2018-12-07 DIAGNOSIS — F413 Other mixed anxiety disorders: Secondary | ICD-10-CM | POA: Diagnosis not present

## 2018-12-07 DIAGNOSIS — Z89512 Acquired absence of left leg below knee: Secondary | ICD-10-CM | POA: Diagnosis not present

## 2018-12-07 DIAGNOSIS — Z79899 Other long term (current) drug therapy: Secondary | ICD-10-CM | POA: Diagnosis not present

## 2018-12-07 DIAGNOSIS — W19XXXA Unspecified fall, initial encounter: Secondary | ICD-10-CM | POA: Diagnosis not present

## 2018-12-07 DIAGNOSIS — T402X5A Adverse effect of other opioids, initial encounter: Secondary | ICD-10-CM | POA: Diagnosis not present

## 2018-12-07 DIAGNOSIS — F419 Anxiety disorder, unspecified: Secondary | ICD-10-CM | POA: Diagnosis present

## 2018-12-07 DIAGNOSIS — R000 Tachycardia, unspecified: Secondary | ICD-10-CM | POA: Diagnosis not present

## 2018-12-07 DIAGNOSIS — G546 Phantom limb syndrome with pain: Secondary | ICD-10-CM | POA: Diagnosis present

## 2018-12-07 DIAGNOSIS — Z4781 Encounter for orthopedic aftercare following surgical amputation: Secondary | ICD-10-CM | POA: Diagnosis present

## 2018-12-07 DIAGNOSIS — F17210 Nicotine dependence, cigarettes, uncomplicated: Secondary | ICD-10-CM | POA: Diagnosis present

## 2018-12-07 DIAGNOSIS — W19XXXD Unspecified fall, subsequent encounter: Secondary | ICD-10-CM | POA: Diagnosis not present

## 2018-12-07 DIAGNOSIS — Z885 Allergy status to narcotic agent status: Secondary | ICD-10-CM | POA: Diagnosis not present

## 2018-12-07 LAB — CBC
HCT: 26.1 % — ABNORMAL LOW (ref 36.0–46.0)
Hemoglobin: 8.3 g/dL — ABNORMAL LOW (ref 12.0–15.0)
MCH: 26.5 pg (ref 26.0–34.0)
MCHC: 31.8 g/dL (ref 30.0–36.0)
MCV: 83.4 fL (ref 80.0–100.0)
Platelets: 222 10*3/uL (ref 150–400)
RBC: 3.13 MIL/uL — ABNORMAL LOW (ref 3.87–5.11)
RDW: 16.8 % — ABNORMAL HIGH (ref 11.5–15.5)
WBC: 6.7 10*3/uL (ref 4.0–10.5)
nRBC: 0.3 % — ABNORMAL HIGH (ref 0.0–0.2)

## 2018-12-07 LAB — CREATININE, SERUM
Creatinine, Ser: 0.74 mg/dL (ref 0.44–1.00)
GFR calc Af Amer: >60 mL/min (ref >60)
GFR calc non Af Amer: >60 mL/min (ref >60)

## 2018-12-07 LAB — APTT: aPTT: 35 seconds (ref 24–36)

## 2018-12-07 MED ORDER — ALPRAZOLAM 0.25 MG PO TABS
0.2500 mg | ORAL_TABLET | Freq: Three times a day (TID) | ORAL | Status: DC | PRN
  Administered 2018-12-08 – 2018-12-12 (×10): 0.5 mg via ORAL
  Administered 2018-12-13 (×2): 0.25 mg via ORAL
  Administered 2018-12-14 (×2): 0.5 mg via ORAL
  Filled 2018-12-07 (×10): qty 2
  Filled 2018-12-07: qty 1
  Filled 2018-12-07 (×2): qty 2
  Filled 2018-12-07: qty 1

## 2018-12-07 MED ORDER — OXYCODONE-ACETAMINOPHEN 5-325 MG PO TABS
1.0000 | ORAL_TABLET | ORAL | Status: DC | PRN
  Administered 2018-12-07 – 2018-12-15 (×33): 2 via ORAL
  Filled 2018-12-07: qty 1
  Filled 2018-12-07: qty 2
  Filled 2018-12-07: qty 1
  Filled 2018-12-07 (×31): qty 2

## 2018-12-07 MED ORDER — OXYCODONE-ACETAMINOPHEN 5-325 MG PO TABS: 1.0000 | ORAL_TABLET | ORAL | Status: DC | PRN

## 2018-12-07 MED ORDER — SORBITOL 70 % SOLN
30.0000 mL | Freq: Every day | Status: DC | PRN
  Administered 2018-12-13: 19:00:00 30 mL via ORAL
  Filled 2018-12-07 (×2): qty 30

## 2018-12-07 MED ORDER — ACETAMINOPHEN 325 MG PO TABS: 325.0000 mg | ORAL_TABLET | ORAL | Status: DC | PRN

## 2018-12-07 MED ORDER — PANTOPRAZOLE SODIUM 40 MG PO TBEC
40.0000 mg | DELAYED_RELEASE_TABLET | Freq: Every day | ORAL | Status: DC
  Administered 2018-12-08 – 2018-12-15 (×8): 40 mg via ORAL
  Filled 2018-12-07 (×8): qty 1

## 2018-12-07 MED ORDER — ACETAMINOPHEN 650 MG RE SUPP: 325.0000 mg | RECTAL | Status: DC | PRN

## 2018-12-07 MED ORDER — HEPARIN SODIUM (PORCINE) 5000 UNIT/ML IJ SOLN
5000.0000 [IU] | Freq: Three times a day (TID) | INTRAMUSCULAR | Status: DC
  Administered 2018-12-07 – 2018-12-15 (×18): 5000 [IU] via SUBCUTANEOUS
  Filled 2018-12-07 (×19): qty 1

## 2018-12-07 MED ORDER — POLYETHYLENE GLYCOL 3350 17 G PO PACK
17.0000 g | PACK | Freq: Every day | ORAL | Status: DC | PRN
  Administered 2018-12-11: 08:00:00 17 g via ORAL
  Filled 2018-12-07: qty 1

## 2018-12-07 MED ORDER — HEPARIN SODIUM (PORCINE) 5000 UNIT/ML IJ SOLN: 5000.0000 [IU] | Freq: Three times a day (TID) | INTRAMUSCULAR | Status: DC

## 2018-12-07 MED ORDER — DOCUSATE SODIUM 100 MG PO CAPS
100.0000 mg | ORAL_CAPSULE | Freq: Every day | ORAL | Status: DC
  Administered 2018-12-08 – 2018-12-15 (×8): 100 mg via ORAL
  Filled 2018-12-07 (×8): qty 1

## 2018-12-07 NOTE — Progress Notes (Signed)
PCA DC'd.  Syringe empty, confirmed by Nehemiah Settle, Charity fundraiser.  Disposed in black can along with all tubing and fluids

## 2018-12-07 NOTE — Progress Notes (Signed)
Physical Therapy Treatment Patient Details Name: Debra BoltMelanie Cerise Ball MRN: [id number] DOB: Apr 06, 1976 Today's Date: 12/07/2018    History of Present Illness 43 y.o. female who initially presented on 11/06/2018 with a left popliteal artery occlusion.  She was taken to the operating room on 11/07/2018 and underwent thrombectomy of the left popliteal, anterior tibial, and posterior tibial artery with vein patch angioplasty of the left popliteal artery. She felt a "pop" on 5/2 and is now s/p L BKA.    PT Comments    Pt performed gait training and functional mobility with good gains.  Pt very motivated but appears to present with mild cognitive impairments.  Plan for CIR is appropriate as she required min assistance for transfers and gait training and mod assistance for pericare to maintain balance in standing. Pt left sitting edge of bed with knee placed into extension and educated to extend L knee as much as possible.      Follow Up Recommendations  CIR     Equipment Recommendations  Other (comment)(TBD)    Recommendations for Other Services Rehab consult     Precautions / Restrictions Precautions Precautions: Fall Precaution Comments: new L BKA Restrictions Weight Bearing Restrictions: Yes LLE Weight Bearing: Non weight bearing    Mobility  Bed Mobility Overal bed mobility: Needs Assistance Bed Mobility: Supine to Sit     Supine to sit: Min assist     General bed mobility comments: Pt required assistance to scoot to edge of bed but able to elevate trunk into sitting without external assistance.   Transfers Overall transfer level: Needs assistance Equipment used: Rolling walker (2 wheeled) Transfers: Sit to/from Stand Sit to Stand: Min assist;Mod assist;+2 safety/equipment         General transfer comment: Cues for hand placement to and from seated surface.  Pt required increased assistance to maintain standing to moderate assistance when performing peri-care.     Ambulation/Gait Ambulation/Gait assistance: Min assist;+2 safety/equipment Gait Distance (Feet): 6 Feet(2nd trial of 20 ft, 3rd trial of 30 ft and 4th trial of 10 ft.  Close chair follow for all trials.  ) Assistive device: Rolling walker (2 wheeled) Gait Pattern/deviations: Step-to pattern;Trunk flexed(Hop to pattern.  )     General Gait Details: Cues for Upper extremity use to elevate R foot to progress step forward.  Pt slow and guarded but motivated to progress.  Short trial performed with mutliple rest breaks due to fatigue.     Stairs             Wheelchair Mobility    Modified Rankin (Stroke Patients Only)       Balance Overall balance assessment: Needs assistance Sitting-balance support: No upper extremity supported;Feet supported Sitting balance-Leahy Scale: Fair Sitting balance - Comments: able to perform [facility]     Standing balance-Leahy Scale: Poor Standing balance comment: dependent on BUE and external assist                            Cognition Arousal/Alertness: Awake/alert Behavior During Therapy: WFL for tasks assessed/performed Overall Cognitive Status: No family/caregiver present to determine baseline cognitive functioning                                 General Comments: Pt appears to be slow to process commands      Exercises  General Comments        Pertinent Vitals/Pain Pain Assessment: Faces Faces Pain Scale: Hurts little more(reports "not that bad") Pain Location: L residual limb Pain Descriptors / Indicators: Sore;Discomfort Pain Intervention(s): Monitored during session;Repositioned    Home Living                      Prior Function            PT Goals (current goals can now be found in the care plan section) Acute Rehab PT Goals Patient Stated Goal: "Be able to play with my kids again" Potential to Achieve Goals: Good Progress towards PT goals:  Progressing toward goals    Frequency    Min 4X/week      PT Plan Current plan remains appropriate    Co-evaluation              AM-PAC PT "6 Clicks" Mobility   Outcome Measure  Help needed turning from your back to your side while in a flat bed without using bedrails?: A Lot Help needed moving from lying on your back to sitting on the side of a flat bed without using bedrails?: A Lot Help needed moving to and from a bed to a chair (including a wheelchair)?: A Lot Help needed standing up from a chair using your arms (e.g., wheelchair or bedside chair)?: A Lot Help needed to walk in hospital room?: Total Help needed climbing 3-5 steps with a railing? : Total 6 Click Score: 10    End of Session Equipment Utilized During Treatment: Gait belt Activity Tolerance: Patient tolerated treatment well Patient left: in chair;with call bell/phone within reach;with chair alarm set Nurse Communication: Mobility status PT Visit Diagnosis: Other abnormalities of gait and mobility (R26.89);Unsteadiness on feet (R26.81);Muscle weakness (generalized) (M62.81)     Time: 2111-7356 PT Time Calculation (min) (ACUTE ONLY): 31 min  Charges:  $Gait Training: 8-22 mins $Therapeutic Activity: 8-22 mins                     Joycelyn Rua, PTA Acute Rehabilitation Services Pager [phone] Office [phone]     Debra Ball Artis Delay 12/07/2018, 2:19 PM

## 2018-12-07 NOTE — Progress Notes (Signed)
Patient arrived to the unit with staff. She complained of no pain and resting well in bed. She is looking forward to therapy in the morning.

## 2018-12-07 NOTE — Progress Notes (Signed)
Meredith Staggers, MD  Physician  Physical Medicine and Rehabilitation  PMR Pre-admission  Signed  Date of Service:  12/06/2018 6:12 PM       Related encounter: Admission (Current) from 12/01/2018 in [facility] 4E CV SURGICAL PROGRESSIVE CARE      Signed         Show:Clear all '[x]'$ Manual'[x]'$ Template'[x]'$ Copied  Added by: '[x]'$ Cristina Gong, RN'[x]'$ Meredith Staggers, MD  '[]'$ Hover for details PMR Admission Coordinator Pre-Admission Assessment  Patient: Debra Ball is an 43 y.o., female MRN: [id number] DOB: Jun 20, 1976 Height: '5\' 1"'$  (154.9 cm) Weight: 82.3 kg  Insurance Information HMO:     PPO: yes     PCP:      IPA:      80/20:      OTHER:  PRIMARY: [employer] [place]      Policy#: [id number]      Subscriber: pt CM Name: Wells Guiles      Phone#: [phone]     Fax#: [phone] Pre-Cert#: [id number] updates due 5/20    Employer: [employer] Benefits:  Phone #: [phone]     Name: 5/14 Eff. Date: 07/25/2018     Deduct: $1750/met      Out of Pocket Max: $5500 includes deductible/met      Life Max: none CIR: 70%      SNF: 70% 100 days Outpatient: 70%     Co-Pay: 30 visits combined Home Health: 70%      Co-Pay: 10 visits DME: 70%     Co-Pay: 30% Providers: in network  SECONDARY: none/ Medicaid not active  Medicaid Application Date:       Case Manager:  Disability Application Date:       Case Worker:   The "Data Collection Information Summary" for patients in Inpatient Rehabilitation Facilities with attached "Privacy Act [place] Records" was provided and verbally reviewed with: N/A  Emergency Contact Information         Contact Information    Name Relation Home Work Mobile   [facility] Spouse [phone]        Current Medical History  Patient Admitting Diagnosis:  Left BKA  History of Present Illness:  43 year old right-handed female with long history of peripheral vascular disease left popliteal artery occlusion underwent  thrombectomy 11/07/2018. She was discharged home on Eliquis. She had been working in a [employer] examiner. Presented 12/01/2018 to [facility] emergency departmentwith increasing pain to her left foot. She no longer had palpable pulses and was transferred to [facility]. Underwent redo left below-knee popliteal artery exposure thrombectomy of left superficial femoral-popliteal anterior tibial and posterior tibial artery 12/01/2018 per Dr. Trula Slade. 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 Scot Dock. Currently on subcutaneous heparin for DVT prophylaxis. Acute blood loss anemia 6.9 patient has been transfused with latest hemoglobin 8.1.    Patient's medical record from [facility]  has been reviewed by the rehabilitation admission coordinator and physician.  Past Medical History      Past Medical History:  Diagnosis Date  . Pregnancy induced hypertension     Family History   family history is not on file.  Prior Rehab/Hospitalizations Has the patient had prior rehab or hospitalizations prior to admission? No  Has the patient had major surgery during 100 days prior to admission? Yes             Current Medications  Current Facility-Administered Medications:  .  0.9 %  sodium chloride infusion (  Manually program via Guardrails IV Fluids), , Intravenous, Once, Eveland, Matthew, PA-C .  0.9 %  sodium chloride infusion (Manually program via Guardrails IV Fluids), , Intravenous, Once, Angelia Mould, MD .  acetaminophen (TYLENOL) tablet 325-650 mg, 325-650 mg, Oral, Q4H PRN, 650 mg at 12/07/18 0957 **OR** acetaminophen (TYLENOL) suppository 325-650 mg, 325-650 mg, Rectal, Q4H PRN, Dagoberto Ligas, PA-C .  ALPRAZolam Duanne Moron) tablet 0.25-0.5 mg, 0.25-0.5 mg, Oral, TID PRN, Dagoberto Ligas, PA-C, 0.5 mg at 12/03/18 1451 .  alum & mag hydroxide-simeth (MAALOX/MYLANTA) 200-200-20 MG/5ML  suspension 15-30 mL, 15-30 mL, Oral, Q2H PRN, Eveland, Matthew, PA-C .  docusate sodium (COLACE) capsule 100 mg, 100 mg, Oral, Daily, Dagoberto Ligas, PA-C, 100 mg at 12/07/18 0957 .  guaiFENesin-dextromethorphan (ROBITUSSIN DM) 100-10 MG/5ML syrup 15 mL, 15 mL, Oral, Q4H PRN, Dagoberto Ligas, PA-C .  heparin injection 5,000 Units, 5,000 Units, Subcutaneous, Q8H, Eveland, Matthew, PA-C, 5,000 Units at 12/07/18 0600 .  hydrALAZINE (APRESOLINE) injection 5 mg, 5 mg, Intravenous, Q20 Min PRN, Eveland, Matthew, PA-C .  ketorolac (TORADOL) 30 MG/ML injection 30 mg, 30 mg, Intravenous, Q6H, Serafina Mitchell, MD, 30 mg at 12/07/18 0600 .  magnesium sulfate IVPB 2 g 50 mL, 2 g, Intravenous, Daily PRN, Dagoberto Ligas, PA-C .  metoprolol tartrate (LOPRESSOR) injection 2-5 mg, 2-5 mg, Intravenous, Q2H PRN, Eveland, Matthew, PA-C .  ondansetron (ZOFRAN) injection 4 mg, 4 mg, Intravenous, Q6H PRN, Dagoberto Ligas, PA-C .  oxyCODONE-acetaminophen (PERCOCET/ROXICET) 5-325 MG per tablet 1-2 tablet, 1-2 tablet, Oral, Q4H PRN, Dagoberto Ligas, PA-C, 2 tablet at 12/05/18 1202 .  pantoprazole (PROTONIX) EC tablet 40 mg, 40 mg, Oral, Daily, Dagoberto Ligas, PA-C, 40 mg at 12/07/18 0957 .  phenol (CHLORASEPTIC) mouth spray 1 spray, 1 spray, Mouth/Throat, PRN, Eveland, Matthew, PA-C .  polyethylene glycol (MIRALAX / GLYCOLAX) packet 17 g, 17 g, Oral, Daily PRN, Eveland, Matthew, PA-C .  potassium chloride SA (K-DUR) CR tablet 20-40 mEq, 20-40 mEq, Oral, Daily PRN, Dagoberto Ligas, PA-C  Patients Current Diet:     Diet Order                  Diet regular Room service appropriate? Yes; Fluid consistency: Thin  Diet effective now               Precautions / Restrictions Precautions Precautions: Fall Precaution Comments: new L BKA Restrictions Weight Bearing Restrictions: Yes LLE Weight Bearing: Non weight bearing   Has the patient had 2 or more falls or a fall with injury in the past year?  No  Prior Activity Level Community (5-7x/wk): active and working  Prior Functional Level Self Care: Did the patient need help bathing, dressing, using the toilet or eating? Independent  Indoor Mobility: Did the patient need assistance with walking from room to room (with or without device)? Independent  Stairs: Did the patient need assistance with internal or external stairs (with or without device)? Independent  Functional Cognition: Did the patient need help planning regular tasks such as shopping or remembering to take medications? Independent  Home Assistive Devices / Equipment Home Assistive Devices/Equipment: None Home Equipment: None  Prior Device Use: Indicate devices/aids used by the patient prior to current illness, exacerbation or injury? None of the above  Current Functional Level Cognition  Overall Cognitive Status: No family/caregiver present to determine baseline cognitive functioning Orientation Level: Oriented X4 General Comments: Pt appears to be slow to process commands    Extremity Assessment (includes Sensation/Coordination)  Upper Extremity Assessment: Defer to  OT evaluation  Lower Extremity Assessment: LLE deficits/detail LLE Deficits / Details: new BKA LLE Sensation: (experiencing phantom pains) LLE Coordination: decreased gross motor    ADLs  Overall ADL's : Needs assistance/impaired Eating/Feeding: Independent Grooming: Set up, Sitting Grooming Details (indicate cue type and reason): able to perform grooming sitting on BSC Upper Body Bathing: Moderate assistance, Sitting Upper Body Bathing Details (indicate cue type and reason): mod A for back, Pt able to wash front without problems Lower Body Bathing: Min guard, Sitting/lateral leans Upper Body Dressing : Min guard, Sitting Lower Body Dressing: Moderate assistance, +2 for safety/equipment, Sit to/from stand Toilet Transfer: Moderate assistance, +2 for safety/equipment, Stand-pivot,  RW, BSC Toilet Transfer Details (indicate cue type and reason): vc for safety with hand placement and sequencing with RW, assist for initial boost Toileting- Clothing Manipulation and Hygiene: Minimal assistance, Sitting/lateral lean, Cueing for compensatory techniques Toileting - Clothing Manipulation Details (indicate cue type and reason): assist for compensatory strategies, Pt able to perform perianal care on the [facility] Functional mobility during ADLs: Moderate assistance, +2 for safety/equipment, Rolling walker, Cueing for safety, Cueing for sequencing General ADL Comments: Pt with balance deficits as she adjusts to new weight distribution, she requires cues for compensatory strategy - but overall moves well. No previous knowledge about DME    Mobility  Overal bed mobility: Needs Assistance Bed Mobility: Supine to Sit Supine to sit: Min assist General bed mobility comments: Pt required assistance to scoot to edge of bed but able to elevate trunk into sitting without external assistance.     Transfers  Overall transfer level: Needs assistance Equipment used: Rolling walker (2 wheeled) Transfers: Sit to/from Stand Sit to Stand: Min assist, Mod assist, +2 safety/equipment Stand pivot transfers: Mod assist, +2 safety/equipment, From elevated surface General transfer comment: Cues for hand placement to and from seated surface.  Pt required increased assistance to maintain standing to moderate assistance when performing peri-care.      Ambulation / Gait / Stairs / Wheelchair Mobility  Ambulation/Gait Ambulation/Gait assistance: Min assist, +2 safety/equipment Gait Distance (Feet): 6 Feet(2nd trial of 20 ft, 3rd trial of 30 ft and 4th trial of 10 ft.  Close chair follow for all trials.  ) Assistive device: Rolling walker (2 wheeled) Gait Pattern/deviations: Step-to pattern, Trunk flexed(Hop to pattern.  ) General Gait Details: Cues for Upper extremity use to elevate R foot to progress step  forward.  Pt slow and guarded but motivated to progress.  Short trial performed with mutliple rest breaks due to fatigue.      Posture / Balance Dynamic Sitting Balance Sitting balance - Comments: able to perform dynamic leans on the [facility] without LOB Balance Overall balance assessment: Needs assistance Sitting-balance support: No upper extremity supported, Feet supported Sitting balance-Leahy Scale: Fair Sitting balance - Comments: able to perform dynamic leans on the [facility] without LOB Standing balance support: Bilateral upper extremity supported Standing balance-Leahy Scale: Poor Standing balance comment: dependent on BUE and external assist    Special needs/care consideration BiPAP/CPAP  N/a CPM  N/a Continuous Drip IV  N/a Dialysis  N/a Life Vest  N/a Oxygen  N/a Special Bed  N/a Trach Size  N/a Wound Vac n/a Skin  BKA surgical incision; right groin incision Bowel mgmt:  Continent LBM 5/11 Bladder mgmt:  continent Diabetic mgmt:  N/a Behavioral consideration  N/a Chemo/radiation  N/a   Previous Home Environment  Living Arrangements: Spouse/significant other, Children Available Help at Discharge: Family, Available 24 hours/day(husband and daughter work opposite  shifts) Type of Home: Mobile home Home Layout: One level Home Access: Stairs to enter Entrance Stairs-Rails: Right Entrance Stairs-Number of Steps: 8 to 9 Bathroom Shower/Tub: Tub/shower unit, Industrial/product designer: Yes How Accessible: Accessible via walker Home Care Services: No Additional Comments: Has a family member who is a CNA who can also assist with ADLs   Discharge Living Setting Plans for Discharge Living Setting: Patient's home, Mobile Home, Lives with (comment)(spouse and 68 year old daughter) Type of Home at Discharge: Mobile home Discharge Home Layout: One level Discharge Home Access: Stairs to enter Entrance Stairs-Rails: Right Entrance Stairs-Number of  Steps: 8 to 9 Discharge Bathroom Shower/Tub: Tub/shower unit, Curtain Discharge Bathroom Toilet: Standard Discharge Bathroom Accessibility: Yes How Accessible: Accessible via walker Does the patient have any problems obtaining your medications?: No  Social/Family/Support Systems Patient Roles: Spouse, Parent(employee) Contact Information: spouse Gerrit Friends Anticipated Caregiver: spouse and daughter Anticipated Ambulance person Information: see above Ability/Limitations of Caregiver: daughter and spouse to rearrange schedules to work opposite each other Caregiver Availability: 24/7 Discharge Plan Discussed with Primary Caregiver: Yes Is Caregiver In Agreement with Plan?: Yes Does Caregiver/Family have Issues with Lodging/Transportation while Pt is in Rehab?: No  Goals/Additional Needs Patient/Family Goal for Rehab: Mod I with PT and OT w/c level Expected length of stay: ELOS 7 to 10 days Additional Information: will need ramp Pt/Family Agrees to Admission and willing to participate: Yes Program Orientation Provided & Reviewed with Pt/Caregiver Including Roles  & Responsibilities: Yes  Decrease burden of Care through IP rehab admission: n/a  Possible need for SNF placement upon discharge:  N/a  Patient Condition: I have reviewed medical records from [facility] , spoken with CM, and patient. I met with patient at the bedside for inpatient rehabilitation assessment.  Patient will benefit from ongoing PT and OT, can actively participate in 3 hours of therapy a day 5 days of the week, and can make measurable gains during the admission.  Patient will also benefit from the coordinated team approach during an Inpatient Acute Rehabilitation admission.  The patient will receive intensive therapy as well as Rehabilitation physician, nursing, social worker, and care management interventions.  Due to bladder management, bowel management, safety, skin/wound care, disease management,  medication administration, pain management and patient education the patient requires 24 hour a day rehabilitation nursing.  The patient is currently min to mod assist with mobility and basic ADLs.  Discharge setting and therapy post discharge at home with home health is anticipated.  Patient has agreed to participate in the Acute Inpatient Rehabilitation Program and will admit today.  Preadmission Screen Completed By:  Cleatrice Burke RN MSN, 12/07/2018 3:23 PM ______________________________________________________________________   Discussed status with Dr. Naaman Plummer  on  12/07/2018 at  1523 and received approval for admission today.  Admission Coordinator:  Cleatrice Burke, RN MSN, time  8295 Date  12/07/2018   Assessment/Plan: Diagnosis: left BKA 1. Does the need for close, 24 hr/day Medical supervision in concert with the patient's rehab needs make it unreasonable for this patient to be served in a less intensive setting? Yes 2. Co-Morbidities requiring supervision/potential complications: PAD, pain mgt 3. Due to bladder management, bowel management, safety, skin/wound care, disease management, medication administration, pain management and patient education, does the patient require 24 hr/day rehab nursing? Yes 4. Does the patient require coordinated care of a physician, rehab nurse, PT (1-2 hrs/day, 5 days/week) and OT (1-2 hrs/day, 5 days/week) to address physical and functional deficits in  the context of the above medical diagnosis(es)? Yes Addressing deficits in the following areas: balance, endurance, locomotion, strength, transferring, bowel/bladder control, bathing, dressing, feeding, grooming, toileting and psychosocial support 5. Can the patient actively participate in an intensive therapy program of at least 3 hrs of therapy 5 days a week? Yes 6. The potential for patient to make measurable gains while on inpatient rehab is excellent 7. Anticipated functional outcomes  upon discharge from inpatients are: modified independent PT, modified independent OT, n/a SLP 8. Estimated rehab length of stay to reach the above functional goals is: 5-7 days 9. Anticipated D/C setting: Home 10. Anticipated post D/C treatments: [place] therapy 11. Overall Rehab/Functional Prognosis: excellent  MD Signature: Meredith Staggers, MD, [facility] 12/07/2018         Revision History

## 2018-12-07 NOTE — TOC Transition Note (Signed)
Transition of Care [facility]) - CM/SW Discharge Note Donn Pierini RN, BSN Transitions of Care Unit 4E- RN Case Manager [phone]  Patient Details  Name: Debra Ball MRN: [id number] Date of Birth: 1976-05-25  Transition of Care [facility]) CM/SW Contact:  Darrold Span, RN Phone Number: 12/07/2018, 3:39 PM   Clinical Narrative:    Pt admitted with ischemic leg- s/p BKA, CIR consulted for possible admission once off PCA, 5/15- CIR received insurance approval and has bed available for pt to admit- pt's PCA has been discontinued. Paged M. Collins PA and received verbal order for transition to [facility] IP rehab today.    Final next level of care: IP Rehab Facility Barriers to Discharge: No Barriers Identified   Patient Goals and CMS Choice Patient states their goals for this hospitalization and ongoing recovery are:: "rehab" CMS Medicare.gov Compare Post Acute Care list provided to:: Patient Choice offered to / list presented to : Patient  Discharge Placement  Transition to [facility] IP rehab today with plan to return home post rehab                     Discharge Plan and Services   Discharge Planning Services: CM Consult Post Acute Care Choice: IP Rehab                               Social Determinants of Health (SDOH) Interventions     Readmission Risk Interventions Readmission Risk Prevention Plan 12/07/2018  Post Dischage Appt Complete  Medication Screening Complete  Transportation Screening Complete

## 2018-12-07 NOTE — Progress Notes (Signed)
Inpatient Rehabilitation Admissions Coordinator  Insurance has approved and bed available to admit pt to day. Pt in agreement. RN CM and RN made aware. I will make the arrangements to admit today.  Ottie Glazier, RN, MSN Rehab Admissions Coordinator [phone] 12/07/2018 3:24 PM

## 2018-12-07 NOTE — Progress Notes (Signed)
Inpatient Rehabilitation Admissions Coordinator  I await insurance approval to possibly admit pt to inpt rehab today. Noted PCA discontinued today.  Ottie Glazier, RN, MSN Rehab Admissions Coordinator [phone] 12/07/2018 1:21 PM

## 2018-12-07 NOTE — H&P (Signed)
Physical Medicine and Rehabilitation Admission H&P     Chief complaint: Stump pain HPI: Debra Ball is a 43 year old right-handed female with long history of peripheral vascular disease left popliteal artery occlusion underwent thrombectomy 11/07/2018.  She was discharged home on Eliquis.  Per chart review patient lives with spouse and her children.  1 level home with 8 steps to entry.  She had been working in a Associate Professor.  Presented 12/01/2018 to [facility] emergency department with increasing pain to her left foot.  She no longer had palpable pulses and was transferred to [facility].  Underwent redo left below-knee popliteal artery exposure thrombectomy of left superficial femoral-popliteal anterior tibial and posterior tibial artery 12/01/2018 per Dr. Myra Gianotti.  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 Edilia Bo.  Currently on subcutaneous heparin for DVT prophylaxis.  Acute blood loss anemia 6.9 patient has been transfused with latest hemoglobin 8.1.  Therapy evaluations completed and patient was admitted for a comprehensive rehab program   Review of Systems  Constitutional: Negative for chills and fever.  HENT: Negative for hearing loss.   Eyes: Negative for blurred vision and double vision.  Respiratory: Negative for shortness of breath.   Cardiovascular: Positive for leg swelling. Negative for chest pain and palpitations.  Gastrointestinal: Positive for constipation. Negative for heartburn, nausea and vomiting.  Genitourinary: Negative for dysuria, flank pain and hematuria.  Musculoskeletal: Positive for myalgias.  Skin: Negative for rash.  Neurological: Negative for seizures.  All other systems reviewed and are negative.       Past Medical History:  Diagnosis Date  . Pregnancy induced hypertension           Past Surgical History:  Procedure Laterality Date  . AMPUTATION Left 12/04/2018    Procedure: LEFT BELOW KNEE AMPUTATION;  Surgeon: Chuck Hint, MD;  Location: [facility] OR;  Service: Vascular;  Laterality: Left;  . AORTOGRAM Left 12/01/2018    Procedure: Abdominal Aortogram with left leg runoff;  Surgeon: Nada Libman, MD;  Location: [facility] OR;  Service: Vascular;  Laterality: Left;  . FEMORAL-TIBIAL BYPASS GRAFT Left 11/07/2018    Procedure: THROMBECTOMY POPLITEAL, ANTERIOR TIBIAL ARTERY, POSTERIOR TIBIAL ARTERY, AND VEIN PATCH ANTERIOR ARTERY;  Surgeon: Nada Libman, MD;  Location: [facility] OR;  Service: Vascular;  Laterality: Left;  . LOWER EXTREMITY ANGIOGRAM Left 12/02/2018    Procedure: Left Lower Extremity Angiogram;  Surgeon: Nada Libman, MD;  Location: [facility] OR;  Service: Vascular;  Laterality: Left;  . LOWER EXTREMITY ANGIOGRAPHY Bilateral 11/06/2018    Procedure: LOWER EXTREMITY ANGIOGRAPHY;  Surgeon: Nada Libman, MD;  Location: [facility] INVASIVE CV LAB;  Service: Cardiovascular;  Laterality: Bilateral;  And ABD   . PATCH ANGIOPLASTY Left 12/01/2018    Procedure: Patch Angioplasty with xenosure Biological patch on Left tibia fibula trunk;  Surgeon: Nada Libman, MD;  Location: [facility] OR;  Service: Vascular;  Laterality: Left;  . TEE WITHOUT CARDIOVERSION   12/01/2018    Procedure: Transesophageal Echocardiogram (Tee);  Surgeon: Nada Libman, MD;  Location: [facility] OR;  Service: Vascular;;  . THROMBECTOMY FEMORAL ARTERY Left 12/01/2018    Procedure: Thrombectomy left leg, SFA, Popliteal artery, posterior tibial artery, and anterior itbial artery, Redo popliteal exposure, posterior tibia exposure;  Surgeon: Nada Libman, MD;  Location: [facility] OR;  Service: Vascular;  Laterality: Left;  . THROMBECTOMY FEMORAL ARTERY Left 12/02/2018    Procedure: Thrombectomy left leg, SFA,  Popliteal artery, posterior tibial artery, and anterior tibial artery, Redo popliteal exposure, posterior tibia exposure, Patch Angioplasty with xenosure Biological patch on Left tibia fibula trunk, Abdominal  Aortogram with left leg runoff;  Surgeon: Nada Libman, MD;  Location: [facility] OR;  Service: Vascular;  Laterality: Left;  . THROMBECTOMY FEMORAL ARTERY   12/02/2018    Procedure: Thrombectomy of left SFA, popliteal artery, posterior tibial artery, and anterior tibial artery;  Surgeon: Nada Libman, MD;  Location: [facility] OR;  Service: Vascular;;    History reviewed. No pertinent family history. Social History:  reports that she has been smoking cigarettes. She has never used smokeless tobacco. She reports current alcohol use. She reports that she does not use drugs. Allergies:      Allergies  Allergen Reactions  . Hydrocodone Bitartrate Er Hives          Medications Prior to Admission  Medication Sig Dispense Refill  . acetaminophen (TYLENOL) 650 MG CR tablet Take 1,300 mg by mouth every 8 (eight) hours as needed for pain.      Marland Kitchen apixaban (ELIQUIS) 5 MG TABS tablet Take 2 tablets (10 mg total) by mouth 2 (two) times daily. (Patient taking differently: Take 5 mg by mouth 2 (two) times daily. ) 12 tablet 0  . traMADol (ULTRAM) 50 MG tablet Take 1 tablet (50 mg total) by mouth every 6 (six) hours as needed. (Patient taking differently: Take 50 mg by mouth every 6 (six) hours as needed for moderate pain. ) 30 tablet 0      Drug Regimen Review Drug regimen was reviewed and remains appropriate with no significant issues identified   Home: Home Living Family/patient expects to be discharged to:: Private residence Living Arrangements: Spouse/significant other, Children Available Help at Discharge: Family, Available 24 hours/day(husband and daughter work opposite shifts) Type of Home: Mobile home Home Access: Stairs to enter Secretary/administrator of Steps: (8-9) Entrance Stairs-Rails: Right Home Layout: One level Bathroom Shower/Tub: Engineer, manufacturing systems: Standard Home Equipment: None Additional Comments: Has a family member who is a CNA who can also assist with ADLs     Functional History: Prior Function Level of Independence: Independent Comments: drives, works in Engineer, civil (consulting)), 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, former Academic librarian (basketball and track)   Functional Status:  Mobility: Bed Mobility General bed mobility comments: on the [facility] when OT/PT entered room Transfers Overall transfer level: Needs assistance Equipment used: Rolling walker (2 wheeled) Transfers: Sit to/from Stand, [employer] Transfers Sit to Stand: Mod assist, +2 safety/equipment, From elevated surface Stand pivot transfers: Mod assist, +2 safety/equipment, From elevated surface General transfer comment: mod A for verbal cues and boost from [facility], +2 for safety with initial transfer - RLE does not clear floor with pivot   ADL: ADL Overall ADL's : Needs assistance/impaired Eating/Feeding: Independent Grooming: Set up, Sitting Grooming Details (indicate cue type and reason): able to perform grooming sitting on BSC Upper Body Bathing: Moderate assistance, Sitting Upper Body Bathing Details (indicate cue type and reason): mod A for back, Pt able to wash front without problems Lower Body Bathing: Min guard, Sitting/lateral leans Upper Body Dressing : Min guard, Sitting Lower Body Dressing: Moderate assistance, +2 for safety/equipment, Sit to/from stand Toilet Transfer: Moderate assistance, +2 for safety/equipment, Stand-pivot, RW, BSC Toilet Transfer Details (indicate cue type and reason): vc for safety with hand placement and sequencing with RW, assist for initial boost Toileting- Clothing Manipulation and Hygiene: Minimal assistance, Sitting/lateral lean, Cueing for compensatory techniques Toileting -  Clothing Manipulation Details (indicate cue type and reason): assist for compensatory strategies, Pt able to perform perianal care on the [facility] Functional mobility during ADLs: Moderate assistance, +2 for safety/equipment, Rolling walker, Cueing for safety, Cueing for sequencing  General ADL Comments: Pt with balance deficits as she adjusts to new weight distribution, she requires cues for compensatory strategy - but overall moves well. No previous knowledge about DME   Cognition: Cognition Overall Cognitive Status: No family/caregiver present to determine baseline cognitive functioning Orientation Level: Oriented X4 Cognition Arousal/Alertness: Awake/alert Behavior During Therapy: WFL for tasks assessed/performed Overall Cognitive Status: No family/caregiver present to determine baseline cognitive functioning General Comments: monitor, tangential conversation, when asking about home set up she said "you should call my husband"   Physical Exam: Blood pressure (!) 125/55, pulse 92, temperature 99.6 F (37.6 C), temperature source Oral, resp. rate 14, height  (1.549 m), weight 82.3 kg, last menstrual period 11/26/2018, SpO2 98 %. Physical Exam  Constitutional: She is oriented to person, place, and time. She appears well-developed and well-nourished. No distress.  HENT:  Head: Normocephalic.  Eyes: Pupils are equal, round, and reactive to light. EOM are normal.  Neck: Normal range of motion. No thyromegaly present.  Cardiovascular: Normal rate and regular rhythm.  Respiratory: Effort normal. No respiratory distress. She has no wheezes.  GI: Soft. She exhibits no distension. There is no abdominal tenderness.  Musculoskeletal:     Comments: LLE dressed, edematous and tender. Unable to fully extend leg.   Neurological: She is alert and oriented to person, place, and time. No cranial nerve deficit. Coordination normal.  UE motor 5/5. LLE--able to lift leg against gravity, can't extend kne fully. RLE: 4/5 prox to distal. Senses pain in all 4's.   Skin: Skin is warm.  BKA site is dressed appropriately tender  Psychiatric: She has a normal mood and affect. Her behavior is normal.      Lab Results Last 48 Hours        Results for orders placed or performed  during the hospital encounter of 12/01/18 (from the past 48 hour(s))  CBC     Status: Abnormal    Collection Time: 12/05/18  6:57 PM  Result Value Ref Range    WBC 9.9 4.0 - 10.5 K/uL    RBC 3.14 (L) 3.87 - 5.11 MIL/uL    Hemoglobin 8.3 (L) 12.0 - 15.0 g/dL    HCT 16.1 (L) 09.6 - 46.0 %    MCV 82.8 80.0 - 100.0 fL    MCH 26.4 26.0 - 34.0 pg    MCHC 31.9 30.0 - 36.0 g/dL    RDW 04.5 (H) 40.9 - 15.5 %    Platelets 155 150 - 400 K/uL    nRBC 0.2 0.0 - 0.2 %      Comment: Performed at [facility], [address], [place], [place] [address]  APTT     Status: Abnormal    Collection Time: 12/06/18  3:25 AM  Result Value Ref Range    aPTT 39 (H) 24 - 36 seconds      Comment:        IF BASELINE aPTT IS ELEVATED, SUGGEST PATIENT RISK ASSESSMENT BE USED TO DETERMINE APPROPRIATE ANTICOAGULANT THERAPY. Performed at [facility], [address] [place], [place] [address]    Basic metabolic panel     Status: Abnormal    Collection Time: 12/06/18  3:25 AM  Result Value Ref Range    Sodium 137  135 - 145 mmol/L    Potassium 3.6 3.5 - 5.1 mmol/L    Chloride 102 98 - 111 mmol/L    CO2 24 22 - 32 mmol/L    Glucose, Bld 112 (H) 70 - 99 mg/dL    BUN 12 6 - 20 mg/dL    Creatinine, Ser 1.610.81 0.44 - 1.00 mg/dL    Calcium 8.5 (L) 8.9 - 10.3 mg/dL    GFR calc non Af Amer >60 >60 mL/min    GFR calc Af Amer >60 >60 mL/min    Anion gap 11 5 - 15      Comment: Performed at [facility], [address] [place], [place] [address]  CBC     Status: Abnormal    Collection Time: 12/06/18  3:25 AM  Result Value Ref Range    WBC 8.3 4.0 - 10.5 K/uL    RBC 3.07 (L) 3.87 - 5.11 MIL/uL    Hemoglobin 8.1 (L) 12.0 - 15.0 g/dL    HCT 54.025.4 (L) 98.136.0 - 46.0 %    MCV 82.7 80.0 - 100.0 fL    MCH 26.4 26.0 - 34.0 pg    MCHC 31.9 30.0 - 36.0 g/dL    RDW 19.117.4 (H) 47.811.5 - 15.5 %    Platelets 152 150 - 400 K/uL    nRBC 0.4 (H) 0.0 - 0.2 %      Comment: Performed at [facility], [address] [place], [place] [address]  APTT     Status: None    Collection Time: 12/07/18  5:22 AM  Result Value Ref Range    aPTT 35 24 - 36 seconds      Comment: Performed at [facility], [address] [place], [place] [address]      Imaging Results (Last 48 hours)  No results found.           Medical Problem List and Plan: 1.  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             -pre-prosthetic education 2.  Antithrombotics: -DVT/anticoagulation: Subcutaneous heparin             -antiplatelet therapy: N/A 3. Pain Management: Oxycodone as needed             -robaxin prn spasms             -massage and sensory feedback 4. Mood: Xanax 0.25-0.5 mg 3 times daily as needed             -antipsychotic agents: N/A 5. Neuropsych: This patient is capable of making decisions on her own behalf. 6. Skin/Wound Care: Routine skin checks             -ACE and dry dressing to stump             -convert to shrinker next week potentially 7. Fluids/Electrolytes/Nutrition: Routine in and outs with follow-up chemistries             -encourage PO 8.  Acute blood loss anemia.  Follow-up CBC 9.  Constipation.  Laxative assistance      Post Admission Physician Evaluation: 1. Functional deficits secondary  to left BKA. 2. Patient is admitted to receive collaborative, interdisciplinary  care between the physiatrist, rehab nursing staff, and therapy team. 3. Patient's level of medical complexity and substantial therapy needs in context of that medical necessity cannot be provided at a lesser intensity of care such as a SNF. 4. Patient has experienced substantial functional loss from his/her baseline which was documented above under the "Functional History" and "Functional Status" headings.  Judging by the patient's diagnosis, physical exam, and functional  history, the patient has potential for functional progress which will result in measurable gains while on inpatient rehab.  These gains will be of substantial and practical use upon discharge  in facilitating mobility and self-care at the household level. 5. Physiatrist will provide 24 hour management of medical needs as well as oversight of the therapy plan/treatment and provide guidance as appropriate regarding the interaction of the two. 6. The Preadmission Screening has been reviewed and patient status is unchanged unless otherwise stated above. 7. 24 hour rehab nursing will assist with bladder management, bowel management, safety, skin/wound care, disease management, medication administration, pain management and patient education  and help integrate therapy concepts, techniques,education, etc. 8. PT will assess and treat for/with: Lower extremity strength, range of motion, stamina, balance, functional mobility, safety, adaptive techniques and equipment, pre-prosthetic ed.   Goals are: mod I, needs to be able to go up stairs. 9. OT will assess and treat for/with: ADL's, functional mobility, safety, upper extremity strength, adaptive techniques and equipment, pre-pros ed.   Goals are: mod I. Therapy may proceed with showering this patient. 10. SLP will assess and treat for/with: n/a.  Goals are: n/a. 11. Case Management and Social Worker will assess and treat for psychological issues and discharge planning. 12. Team conference will be held weekly to assess progress toward goals and to determine barriers to discharge. 13. Patient will receive at least 3 hours of therapy per day at least 5 days per week. 14. ELOS: 5-7 days       15. Prognosis:  excellent   I have personally performed a face to face diagnostic evaluation of this patient and formulated the key components of the plan.  Additionally, I have personally reviewed laboratory data, imaging studies, as well as relevant notes and concur with  the physician assistant's documentation above.  Ranelle Oyster, MD, FAAPMR     Mcarthur Rossetti Angiulli, PA-C 12/07/2018

## 2018-12-07 NOTE — Progress Notes (Addendum)
[facility]  Subjective  - Doing a little better today.   Objective (!) 125/55 92 99.6 F (37.6 C) (Oral) 17 96%  Intake/Output Summary (Last 24 hours) at 12/07/2018 0730 Last data filed at 12/06/2018 3704 Gross per 24 hour  Intake -  Output 200 ml  Net -200 ml    Left BKA stump with lateral bloody drainage BKA stump appears viable Clean dry dressing applied Lungs non labored breathing Heart RRR  Assessment/Planning: POD # 3 Left BKA  Pending weaning off PCA and going to PO pain medication Pending CIR once pain controlled  Mosetta Pigeon 12/07/2018 7:30 AM --  Laboratory Lab Results: Recent Labs    12/05/18 1857 12/06/18 0325  WBC 9.9 8.3  HGB 8.3* 8.1*  HCT 26.0* 25.4*  PLT 155 152   BMET Recent Labs    12/05/18 0247 12/06/18 0325  NA 138 137  K 3.8 3.6  CL 104 102  CO2 24 24  GLUCOSE 128* 112*  BUN 14 12  CREATININE 0.81 0.81  CALCIUM 8.6* 8.5*    COAG Lab Results  Component Value Date   INR 1.0 11/06/2018   No results found for: PTT   I have seen and evaluated the patient. I agree with the PA note as documented above. CIR when approved.  BKA looks good.  Will continue to monitor.  Cephus Shelling, MD [facility] Office: [phone] Pager: [phone]

## 2018-12-08 ENCOUNTER — Inpatient Hospital Stay (HOSPITAL_COMMUNITY): Payer: BLUE CROSS/BLUE SHIELD | Admitting: Physical Therapy

## 2018-12-08 ENCOUNTER — Inpatient Hospital Stay (HOSPITAL_COMMUNITY): Payer: BLUE CROSS/BLUE SHIELD | Admitting: Occupational Therapy

## 2018-12-08 DIAGNOSIS — F413 Other mixed anxiety disorders: Secondary | ICD-10-CM

## 2018-12-08 DIAGNOSIS — I7090 Unspecified atherosclerosis: Secondary | ICD-10-CM

## 2018-12-08 NOTE — Evaluation (Signed)
Occupational Therapy Assessment and Plan  Patient Details  Name: Debra Ball MRN: [id number] Date of Birth: 1976/02/16  OT Diagnosis: lower limb ampuation, mobility dysfunction Rehab Potential: Rehab Potential (ACUTE ONLY): Good ELOS: 1 week   Today's Date: 12/08/2018 OT Individual Time: 1250-1400 OT Individual Time Calculation (min): 70 min     Problem List:  Patient Active Problem List   Diagnosis Date Noted  . Left below-knee amputee ([place]) 12/07/2018  . Ischemia of left lower extremity 12/02/2018  . Critical lower limb ischemia 12/02/2018  . Popliteal artery occlusion, left ([place]) 11/06/2018    Past Medical History:  Past Medical History:  Diagnosis Date  . Pregnancy induced hypertension    Past Surgical History:  Past Surgical History:  Procedure Laterality Date  . AMPUTATION Left 12/04/2018   Procedure: LEFT BELOW KNEE AMPUTATION;  Surgeon: Angelia Mould, MD;  Location: [place];  Service: Vascular;  Laterality: Left;  . AORTOGRAM Left 12/01/2018   Procedure: Abdominal Aortogram with left leg runoff;  Surgeon: Serafina Mitchell, MD;  Location: [place];  Service: Vascular;  Laterality: Left;  . FEMORAL-TIBIAL BYPASS GRAFT Left 11/07/2018   Procedure: THROMBECTOMY POPLITEAL, ANTERIOR TIBIAL ARTERY, POSTERIOR TIBIAL ARTERY, AND VEIN PATCH ANTERIOR ARTERY;  Surgeon: Serafina Mitchell, MD;  Location: [place];  Service: Vascular;  Laterality: Left;  . LOWER EXTREMITY ANGIOGRAM Left 12/02/2018   Procedure: Left Lower Extremity Angiogram;  Surgeon: Serafina Mitchell, MD;  Location: [place];  Service: Vascular;  Laterality: Left;  . LOWER EXTREMITY ANGIOGRAPHY Bilateral 11/06/2018   Procedure: LOWER EXTREMITY ANGIOGRAPHY;  Surgeon: Serafina Mitchell, MD;  Location: [place] [facility];  Service: Cardiovascular;  Laterality: Bilateral;  And ABD   . PATCH ANGIOPLASTY Left 12/01/2018   Procedure: Patch Angioplasty with xenosure Biological patch on Left tibia fibula trunk;  Surgeon:  Serafina Mitchell, MD;  Location: [place];  Service: Vascular;  Laterality: Left;  . TEE WITHOUT CARDIOVERSION  12/01/2018   Procedure: Transesophageal Echocardiogram (Tee);  Surgeon: Serafina Mitchell, MD;  Location: [facility];  Service: Vascular;;  . THROMBECTOMY FEMORAL ARTERY Left 12/01/2018   Procedure: Thrombectomy left leg, SFA, Popliteal artery, posterior tibial artery, and anterior itbial artery, Redo popliteal exposure, posterior tibia exposure;  Surgeon: Serafina Mitchell, MD;  Location: [place];  Service: Vascular;  Laterality: Left;  . THROMBECTOMY FEMORAL ARTERY Left 12/02/2018   Procedure: Thrombectomy left leg, SFA, Popliteal artery, posterior tibial artery, and anterior tibial artery, Redo popliteal exposure, posterior tibia exposure, Patch Angioplasty with xenosure Biological patch on Left tibia fibula trunk, Abdominal Aortogram with left leg runoff;  Surgeon: Serafina Mitchell, MD;  Location: [place];  Service: Vascular;  Laterality: Left;  . THROMBECTOMY FEMORAL ARTERY  12/02/2018   Procedure: Thrombectomy of left SFA, popliteal artery, posterior tibial artery, and anterior tibial artery;  Surgeon: Serafina Mitchell, MD;  [facility];  Service: Vascular;;    Assessment & Plan Clinical Impression: Patient is a 43 y.o. year old female with long history of peripheral vascular disease left popliteal artery occlusion underwent thrombectomy 11/07/2018. She was discharged home on Eliquis. Per chart review patient lives with spouse and her children. 1 level home with 8 steps to entry. 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 She no longer had palpable pulses and was transferred to [facility]. Underwent redo left below-knee popliteal artery exposure thrombectomy of left superficial femoral-popliteal anterior tibial and  posterior tibial artery 12/01/2018 per Dr. Trula Slade. Patient with progressive ongoing  ischemic changes felt the limb was not salvageable and underwent left BKA 12/04/2018 per Dr. Scot Dock. Currently on subcutaneous heparin for DVT prophylaxis. Acute blood loss anemia 6.9 patient has been transfused with latest hemoglobin 8.1.   Patient transferred to CIR on 12/07/2018 .    Patient currently requires min with basic self-care skills secondary to muscle weakness and decreased standing balance.  Prior to hospitalization, patient could complete all self care with independent .  Patient will benefit from skilled intervention to decrease level of assist with basic self-care skills and increase independence with basic self-care skills prior to discharge home with care partner.  Anticipate patient will require intermittent supervision and no further OT follow recommended.  OT - End of Session Activity Tolerance: Tolerates 30+ min activity without fatigue Endurance Deficit: Yes OT Assessment Rehab Potential (ACUTE ONLY): Good OT Patient demonstrates impairments in the following area(s): Balance;Endurance;Pain OT Basic ADL's Functional Problem(s): Bathing;Dressing;Toileting OT Advanced ADL's Functional Problem(s): Simple Meal Preparation;Light Housekeeping OT Transfers Functional Problem(s): Toilet;Tub/Shower OT Plan OT Intensity: Minimum of 1-2 x/day, 45 to 90 minutes OT Frequency: 5 out of 7 days OT Duration/Estimated Length of Stay: 1 week OT Treatment/Interventions: Balance/vestibular training;Discharge planning;Pain management;Self Care/advanced ADL retraining;Therapeutic Activities;Functional mobility training;Patient/family education;Skin care/wound managment;DME/adaptive equipment instruction OT Self Feeding Anticipated Outcome(s): independent OT Basic Self-Care Anticipated Outcome(s): mod I OT Toileting Anticipated Outcome(s): mod I OT Bathroom Transfers Anticipated Outcome(s): mod I OT Recommendation Patient destination: Home Follow Up Recommendations: None Equipment  Recommended: 3 in 1 bedside comode;Tub/shower bench   Skilled Therapeutic Intervention Patient in bed upon arrival.  She is pleasant and cooperative.  She is calm at this time but states that she is concerned about her pain and ability to participate.  Reviewed role of OT, what to expect during therapy session, evaluation process, plan of care and goals.  Reviewed phantom pain, DME options and limb care.  Evaluation completed as stated below, she was able to completed bed mobility and SPT with RW bed to w/c with CG A and cues for technique.  She performed sponge bath, grooming and dressing tasks seated in the w/c at the sink as documented below.  Sit to stand for bathing and dressing tasks with CGA.  She is able to tolerate stance for 1-2 minutes without difficulty.  She remained in the w/c at close of session with seat belt alarm set and ready for PT session immediately following.    OT Evaluation Precautions/Restrictions  Precautions Precautions: Fall Precaution Comments: new L BKA Restrictions Weight Bearing Restrictions: Yes LLE Weight Bearing: Non weight bearing General Chart Reviewed: Yes Vital Signs  Pain Pain Assessment Pain Scale: 0-10 Pain Score: 2  Pain Type: Surgical pain Pain Location: Leg Pain Orientation: Left Pain Descriptors / Indicators: Numbness Pain Onset: On-going Pain Intervention(s): Repositioned Home Living/Prior Functioning Home Living Family/patient expects to be discharged to:: Private residence Living Arrangements: Spouse/significant other Available Help at Discharge: Family, Available 24 hours/day Type of Home: Mobile home Home Access: Stairs to enter [employer] of Steps: 8 to 9 Entrance Stairs-Rails: Right Home Layout: One level Bathroom Shower/Tub: Tub/shower unit, Architectural technologist: Programmer, systems: Yes  Lives With: Spouse, Family Prior Function Level of Independence: Independent with gait, Independent with  transfers  Able to Take Stairs?: Yes Driving: Yes Vocation: Full time employment 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 ADL ADL Eating: Independent Where Assessed-Eating: Bed level Grooming: Independent Where Assessed-Grooming:  Sitting at sink, Wheelchair Upper Body Bathing: Setup Where Assessed-Upper Body Bathing: Sitting at sink, Wheelchair Lower Body Bathing: Contact guard Where Assessed-Lower Body Bathing: Standing at sink, Sitting at sink, Wheelchair Upper Body Dressing: Setup Where Assessed-Upper Body Dressing: Sitting at sink, Wheelchair Lower Body Dressing: Minimal assistance Where Assessed-Lower Body Dressing: Sitting at sink, Wheelchair Vision Baseline Vision/History: Wears glasses Wears Glasses: At all times Patient Visual Report: No change from baseline Vision Assessment?: No apparent visual deficits Perception  Perception: Within Functional Limits Praxis Praxis: Intact Cognition Overall Cognitive Status: Within Functional Limits for tasks assessed Arousal/Alertness: Awake/alert Orientation Level: Person;Place;Situation Person: Oriented Place: Oriented Situation: Oriented Year: 2020 Month: May Day of Week: Correct Memory: Appears intact Immediate Memory Recall: Sock;Blue;Bed Memory Recall: Sock;Blue;Bed Memory Recall Sock: Without Cue Memory Recall Blue: Without Cue Memory Recall Bed: Without Cue Attention: Focused Focused Attention: Appears intact Awareness: Appears intact Problem Solving: Appears intact Behaviors: Perseveration;Poor frustration tolerance;Lability Safety/Judgment: Appears intact Comments: emotionally labile throughout session, needs constant cueing for focus on therapy tasks Sensation Sensation Light Touch: Impaired Detail Light Touch Impaired Details: Impaired LLE Proprioception: Appears Intact Additional Comments: UB sensation intact Coordination Gross Motor Movements are Fluid and Coordinated: No Fine  Motor Movements are Fluid and Coordinated: Yes Coordination and Movement Description: impaired by new L BKA and generalized weakness Finger Nose Finger Test: good UB coordination Motor  Motor Motor: Within Functional Limits Motor - Skilled Clinical Observations: generalized weakness Mobility  Bed Mobility Bed Mobility: Rolling Right;Rolling Left;Supine to Sit;Sit to Supine Rolling Right: Minimal Assistance - Patient > 75% Rolling Left: Minimal Assistance - Patient > 75% Supine to Sit: Contact Guard/Touching assist Sit to Supine: Minimal Assistance - Patient > 75%  Trunk/Postural Assessment  Cervical Assessment Cervical Assessment: Exceptions to WFL(forward head) Thoracic Assessment Thoracic Assessment: Exceptions to WFL(rounded shoulders) Lumbar Assessment Lumbar Assessment: Within Functional Limits Postural Control Postural Control: Within Functional Limits  Balance Balance Balance Assessed: Yes Static Sitting Balance Static Sitting - Balance Support: No upper extremity supported;Feet supported Static Sitting - Level of Assistance: 5: Stand by assistance Dynamic Sitting Balance Dynamic Sitting - Balance Support: During functional activity Dynamic Sitting - Level of Assistance: 5: Stand by assistance Extremity/Trunk Assessment RUE Assessment RUE Assessment: Within Functional Limits General Strength Comments: 5/5 LUE Assessment LUE Assessment: Within Functional Limits General Strength Comments: 5/5     Refer to Care Plan for Long Term Goals  Recommendations for other services: None    Discharge Criteria: Patient will be discharged from OT if patient refuses treatment 3 consecutive times without medical reason, if treatment goals not met, if there is a change in medical status, if patient makes no progress towards goals or if patient is discharged from hospital.  The above assessment, treatment plan, treatment alternatives and goals were discussed and mutually agreed  upon: by patient  Carlos Levering 12/08/2018, 4:36 PM

## 2018-12-08 NOTE — Evaluation (Signed)
Physical Therapy Assessment and Plan  Patient Details  Name: Debra Ball MRN: [id number] Date of Birth: [birth year]  PT Diagnosis: Difficulty walking, Muscle weakness and Pain in L residual limb Rehab Potential: Good ELOS: 7-10   Today's Date: 12/08/2018 PT Individual Time: 0900-1000 PT Individual Time Calculation (min): 60 min    Problem List:  Patient Active Problem List   Diagnosis Date Noted  . Left below-knee amputee ([place]) 12/07/2018  . Ischemia of left lower extremity 12/02/2018  . Critical lower limb ischemia 12/02/2018  . Popliteal artery occlusion, left ([place]) 11/06/2018    Past Medical History:  Past Medical History:  Diagnosis Date  . Pregnancy induced hypertension    Past Surgical History:  Past Surgical History:  Procedure Laterality Date  . AMPUTATION Left 12/04/2018   Procedure: LEFT BELOW KNEE AMPUTATION;  Surgeon: Debra Mould, MD;  Location: [place];  Service: Vascular;  Laterality: Left;  . AORTOGRAM Left 12/01/2018   Procedure: Abdominal Aortogram with left leg runoff;  Surgeon: Debra Mitchell, MD;  Location: [place];  Service: Vascular;  Laterality: Left;  . FEMORAL-TIBIAL BYPASS GRAFT Left 11/07/2018   Procedure: THROMBECTOMY POPLITEAL, ANTERIOR TIBIAL ARTERY, POSTERIOR TIBIAL ARTERY, AND VEIN PATCH ANTERIOR ARTERY;  Surgeon: Debra Mitchell, MD;  Location: [place];  Service: Vascular;  Laterality: Left;  . LOWER EXTREMITY ANGIOGRAM Left 12/02/2018   Procedure: Left Lower Extremity Angiogram;  Surgeon: Debra Mitchell, MD;  Location: [place];  Service: Vascular;  Laterality: Left;  . LOWER EXTREMITY ANGIOGRAPHY Bilateral 11/06/2018   Procedure: LOWER EXTREMITY ANGIOGRAPHY;  Surgeon: Debra Mitchell, MD;  Location: [place] [facility];  Service: Cardiovascular;  Laterality: Bilateral;  And ABD   . PATCH ANGIOPLASTY Left 12/01/2018   Procedure: Patch Angioplasty with xenosure Biological patch on Left tibia fibula trunk;  Surgeon: Debra Mitchell,  MD;  Location: [place];  Service: Vascular;  Laterality: Left;  . TEE WITHOUT CARDIOVERSION  12/01/2018   Procedure: Transesophageal Echocardiogram (Tee);  Surgeon: Debra Mitchell, MD;  Location: [facility];  Service: Vascular;;  . THROMBECTOMY FEMORAL ARTERY Left 12/01/2018   Procedure: Thrombectomy left leg, SFA, Popliteal artery, posterior tibial artery, and anterior itbial artery, Redo popliteal exposure, posterior tibia exposure;  Surgeon: Debra Mitchell, MD;  Location: [place];  Service: Vascular;  Laterality: Left;  . THROMBECTOMY FEMORAL ARTERY Left 12/02/2018   Procedure: Thrombectomy left leg, SFA, Popliteal artery, posterior tibial artery, and anterior tibial artery, Redo popliteal exposure, posterior tibia exposure, Patch Angioplasty with xenosure Biological patch on Left tibia fibula trunk, Abdominal Aortogram with left leg runoff;  Surgeon: Debra Mitchell, MD;  Location: [place];  Service: Vascular;  Laterality: Left;  . THROMBECTOMY FEMORAL ARTERY  12/02/2018   Procedure: Thrombectomy of left SFA, popliteal artery, posterior tibial artery, and anterior tibial artery;  Surgeon: Debra Mitchell, MD;  [facility];  Service: Vascular;;    Assessment & Plan Clinical Impression: Debra Ball a 43 year old right-handed female with long history of peripheral vascular disease left popliteal artery occlusion underwent thrombectomy 11/07/2018. She was discharged home on Eliquis. Per chart review patient lives with spouse and her children. 1 level home with 8 steps to entry. 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 She no longer had palpable pulses and was transferred to [facility]. Underwent redo left below-knee popliteal artery exposure thrombectomy of left superficial femoral-popliteal anterior tibial and posterior tibial artery  12/01/2018 per Dr. Trula Ball. Patient with progressive ongoing ischemic  changes felt the limb was not salvageable and underwent left BKA 12/04/2018 per Dr. Scot Ball. Currently on subcutaneous heparin for DVT prophylaxis. Acute blood loss anemia 6.9 patient has been transfused with latest hemoglobin 8.1. Therapy evaluations completed and patient was admitted for a comprehensive rehab program. Patient transferred to CIR on 12/07/2018 .   Patient currently requires min with mobility secondary to muscle weakness and decreased sitting balance and decreased balance strategies.  Prior to hospitalization, patient was independent  with mobility and lived with Spouse, Family in a Mobile home home.  Home access is 8 to 9Stairs to enter.  Patient will benefit from skilled PT intervention to maximize safe functional mobility, minimize fall risk and decrease caregiver burden for planned discharge home with intermittent assist.  Anticipate patient will benefit from follow up [facility] at discharge.  PT - End of Session Activity Tolerance: Tolerates 30+ min activity with multiple rests Endurance Deficit: Yes PT Assessment Rehab Potential (ACUTE/IP ONLY): Good PT Barriers to Discharge: [place] home environment;Medical stability;Home environment access/layout;Behavior PT Patient demonstrates impairments in the following area(s): Balance;Behavior;Endurance;Pain;Safety PT Transfers Functional Problem(s): Bed Mobility;Bed to Chair;Car;Furniture;Floor PT Locomotion Functional Problem(s): Ambulation;Wheelchair Mobility;Stairs PT Plan PT Intensity: Minimum of 1-2 x/day ,45 to 90 minutes PT Frequency: 5 out of 7 days PT Duration Estimated Length of Stay: 7-10 PT Treatment/Interventions: Ambulation/gait training;Balance/vestibular training;Cognitive remediation/compensation;Community reintegration;Discharge planning;Disease management/prevention;DME/adaptive equipment instruction;Functional mobility training;Pain management;Patient/family education;Psychosocial support;Stair training;Therapeutic  Activities;Therapeutic Exercise;UE/LE Strength taining/ROM;UE/LE Coordination activities;Wheelchair propulsion/positioning PT Transfers Anticipated Outcome(s): mod I PT Locomotion Anticipated Outcome(s): mod I at w/c level PT Recommendation Recommendations for Other Services: Neuropsych consult;Therapeutic Recreation consult Therapeutic Recreation Interventions: Stress management Follow Up Recommendations: Home health PT Patient destination: Home Equipment Recommended: Wheelchair (measurements);Wheelchair cushion (measurements);Rolling walker with 5" wheels Equipment Details: size TBD  Skilled Therapeutic Intervention Evaluation completed (see details above and below) with education on PT POC and goals and individual treatment initiated with focus on bed mobility and functional transfer assessment. Pt received supine in bed asleep, able to be awoken but pt very lethargic and disoriented. Per pt report she works the overnight shift so being awake at 9 am is unusual for her. Supine to sitting EOB with min A. Pt requesting to use the bathroom. Squat pivot transfer bed to bedside commode with min A. Pt has onset of L residual limb pain while seated on commode. RN had provided pain medication at beginning of therapy session. Education with patient about phantom limb pain and desensitization to improve limb sensitivity. Pt distracted throughout session thinking about her family as well as perseverating on pain. Pt emotionally labile throughout session as well. Provided emotional support to patient. Pt is setup A for pericare. Squat pivot transfer back to bed with min A. Pt requesting to lay back down in bed. Pt requires increased time and encouragement to complete transfer due to significant pain in LLE. Min A for LLE management sit to supine. Pt crying out due to pain and unable to be consoled, RN notified and able to check on pt. Pt left semi-reclined in bed with RN present.  PT  Evaluation Precautions/Restrictions Precautions Precautions: Fall Precaution Comments: new L BKA Restrictions Weight Bearing Restrictions: Yes LLE Weight Bearing: Non weight bearing Pain Pain Assessment Pain Scale: 0-10 Pain Score: 8  Pain Location: Leg Pain Orientation: Left Pain Onset: On-going Pain Intervention(s): Medication (See eMAR) Home Living/Prior Functioning Home Living Available Help at Discharge: Family;Available 24 hours/day Type of Home: Mobile home  Home Access: Stairs to enter Entrance Stairs-Number of Steps: 8 to 9 Entrance Stairs-Rails: Right Home Layout: One level  Lives With: Spouse;Family Prior Function Level of Independence: Independent with gait;Independent with transfers  Able to Take Stairs?: Yes Driving: Yes Vocation: Full time employment 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 Vision/Perception  Vision - History Baseline Vision: Wears glasses only for reading Perception Perception: Within Functional Limits Praxis Praxis: Intact  Cognition Overall Cognitive Status: No family/caregiver present to determine baseline cognitive functioning Arousal/Alertness: Lethargic Orientation Level: Oriented X4 Attention: Focused Focused Attention: Impaired Memory: Appears intact Awareness: Appears intact Problem Solving: Appears intact Behaviors: Perseveration;Poor frustration tolerance;Lability Safety/Judgment: Impaired Comments: emotionally labile throughout session, needs constant cueing for focus on therapy tasks Sensation Sensation Light Touch: Impaired Detail Light Touch Impaired Details: Impaired LLE Proprioception: Appears Intact Coordination Gross Motor Movements are Fluid and Coordinated: No Fine Motor Movements are Fluid and Coordinated: Yes Coordination and Movement Description: impaired by new L BKA and generalized weakness Motor  Motor Motor: Within Functional Limits Motor - Skilled Clinical Observations:  generalized weakness  Mobility Bed Mobility Bed Mobility: Rolling Right;Rolling Left;Supine to Sit;Sit to Supine Rolling Right: Minimal Assistance - Patient > 75% Rolling Left: Minimal Assistance - Patient > 75% Supine to Sit: Minimal Assistance - Patient > 75% Sit to Supine: Minimal Assistance - Patient > 75% Transfers Transfers: Pharmacist, hospital Pivot Transfers: Minimal Assistance - Patient > 75% Transfer (Assistive device): None Locomotion  Stairs / Additional Locomotion Stairs: No Wheelchair Mobility Wheelchair Mobility: No  Trunk/Postural Assessment  Cervical Assessment Cervical Assessment: Exceptions to WFL(forward head) Thoracic Assessment Thoracic Assessment: Exceptions to WFL(rounded shoulders) Lumbar Assessment Lumbar Assessment: Within Functional Limits Postural Control Postural Control: Within Functional Limits  Balance Balance Balance Assessed: Yes Static Sitting Balance Static Sitting - Balance Support: No upper extremity supported;Feet supported Static Sitting - Level of Assistance: 5: Stand by assistance Dynamic Sitting Balance Dynamic Sitting - Balance Support: No upper extremity supported;Feet supported;During functional activity Dynamic Sitting - Level of Assistance: 4: Min assist Extremity Assessment   RLE Assessment RLE Assessment: Exceptions to [facility] General Strength Comments: impaired see below RLE Strength Right Hip Flexion: 3/5 Right Knee Flexion: 3/5 Right Knee Extension: 3/5 Right Ankle Dorsiflexion: 3/5 LLE Assessment LLE Assessment: Exceptions to [facility] Passive Range of Motion (PROM) Comments: L BKA General Strength Comments: not tested 2/2 new L BKA and pain    Refer to Care Plan for Long Term Goals  Recommendations for other services: Neuropsych and Therapeutic Recreation  Stress management  Discharge Criteria: Patient will be discharged from PT if patient refuses treatment 3 consecutive times without medical reason, if  treatment goals not met, if there is a change in medical status, if patient makes no progress towards goals or if patient is discharged from hospital.  The above assessment, treatment plan, treatment alternatives and goals were discussed and mutually agreed upon: by patient   Excell Seltzer, PT, DPT 12/08/2018, 12:50 PM

## 2018-12-08 NOTE — Progress Notes (Signed)
Physical Therapy Session Note  Patient Details  Name: Debra Ball MRN: [id number] Date of Birth: May 12, 1976  Today's Date: 12/08/2018 PT Individual Time: 1410-1508 PT Individual Time Calculation (min): 58 min   Short Term Goals: Week 1:  PT Short Term Goal 1 (Week 1): =LTG due to ELOS  Skilled Therapeutic Interventions/Progress Updates:  Pt received sitting in w/c and talking with her husband. Therapist educated husband on appropriate therapy clothing and need for tennis shoe for R LE during therapy. Pt educated on providing tactile stimulus for input to L LE to decrease sensitization in preparation for prosthetic. Pt performed B UE w/c propulsion for ~17ft with min assist progressed to close supervision and intermittent hand-over-hand instruction for turning. Performed squat pivot transfer w/c<>car with CGA for steadying and cuing/education on proper set-up and sequencing of transfer. Sit<>stand w/c/chair<>RW with min assist progressed to CGA.Ambulated 20ftx2 (seated break between) using RW with min assist progressed to CGA for balance. Pt educated on L LE limb wrapping and shaping for improved prosthetic fit as well as importance of caring for R LE health. Transported back to room in w/c. Squat/stand pivot transfer w/c>EOB using bedrails with CGA for steadying. Pt left sitting on EOB with needs in reach and bed alarm on.  Therapy Documentation Precautions:  Precautions Precautions: Fall Precaution Comments: new L BKA Restrictions Weight Bearing Restrictions: Yes LLE Weight Bearing: Non weight bearing  Pain: Pt complained of pain upon initially moving L LE off of leg rests (no additional details provided) then otherwise reported no pain throughout session.    Therapy/Group: Individual Therapy  Ginny Forth, PT, DPT 12/08/2018, 2:44 PM

## 2018-12-08 NOTE — Progress Notes (Signed)
Debra Ball is a 43 y.o. female 12-27-1975 [id number]  Subjective: No new complaints. No new problems. Slept well. Feeling OK.  Objective: Vital signs in last 24 hours: Temp:  [97.6 F (36.4 C)-99.3 F (37.4 C)] 97.6 F (36.4 C) (05/16 1700) Pulse Rate:  [79-99] 99 (05/16 1700) Resp:  [15-20] 16 (05/16 1700) BP: (123-152)/(56-88) 123/72 (05/16 1700) SpO2:  [100 %] 100 % (05/16 1700) Weight:  [82.3 kg] 82.3 kg (05/15 1825) Weight change:  Last BM Date: 12/07/18  Intake/Output from previous day: No intake/output data recorded. Last cbgs: CBG (last 3)  No results for input(s): GLUCAP in the last 72 hours.   Physical Exam General: No apparent distress   HEENT: not dry Lungs: Normal effort. Lungs clear to auscultation, no crackles or wheezes. Cardiovascular: Regular rate and rhythm, no edema Abdomen: S/NT/ND; BS(+) Musculoskeletal:  unchanged Neurological: No new neurological deficits Wounds: dressed   Skin: clear  Mental state: Asleep    Lab Results: BMET    Component Value Date/Time   NA 137 12/06/2018 0325   K 3.6 12/06/2018 0325   CL 102 12/06/2018 0325   CO2 24 12/06/2018 0325   GLUCOSE 112 (H) 12/06/2018 0325   BUN 12 12/06/2018 0325   CREATININE 0.74 12/07/2018 1817   CALCIUM 8.5 (L) 12/06/2018 0325   GFRNONAA >60 12/07/2018 1817   GFRAA >60 12/07/2018 1817   CBC    Component Value Date/Time   WBC 6.7 12/07/2018 1817   RBC 3.13 (L) 12/07/2018 1817   HGB 8.3 (L) 12/07/2018 1817   HCT 26.1 (L) 12/07/2018 1817   PLT 222 12/07/2018 1817   MCV 83.4 12/07/2018 1817   MCH 26.5 12/07/2018 1817   MCHC 31.8 12/07/2018 1817   RDW 16.8 (H) 12/07/2018 1817    Studies/Results: No results found.  Medications: I have reviewed the patient's current medications.  Assessment/Plan:  1.  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  Continue with CIR. 2.  DVT prophylaxis with  subcutaneous heparin 3.  Pain management with PRN oxycodone.  Robaxin for spasms.  Massage and sensory feedback 4.  Anxiety.  Xanax as needed 5.  Skin/wound care.  Ace and dry dressing to stump.  Convert to shrinker next week 6.  Acute blood loss anemia.  Follow-up CBC 7.  Constipation.  Laxative assistance       Length of stay, days: 1  Sonda Primes , MD 12/08/2018, 5:30 PM

## 2018-12-08 NOTE — Plan of Care (Signed)
  Problem: RH PAIN MANAGEMENT Goal: RH STG PAIN MANAGED AT OR BELOW PT'S PAIN GOAL Description Pain free or pain less than 3 during admission.  Outcome: Not Progressing; pt was shouting and screaming in pain   Problem: RH KNOWLEDGE DEFICIT LIMB LOSS Goal: RH STG INCREASE KNOWLEDGE OF SELF CARE AFTER LIMB LOSS Outcome: Not Progressing; educated on phantom limb pain

## 2018-12-09 MED ORDER — GABAPENTIN 100 MG PO CAPS
100.0000 mg | ORAL_CAPSULE | Freq: Four times a day (QID) | ORAL | Status: DC
  Administered 2018-12-09 – 2018-12-10 (×4): 100 mg via ORAL
  Filled 2018-12-09 (×3): qty 1

## 2018-12-09 MED ORDER — METHOCARBAMOL 500 MG PO TABS
500.0000 mg | ORAL_TABLET | Freq: Three times a day (TID) | ORAL | Status: DC | PRN
  Administered 2018-12-09 – 2018-12-14 (×9): 500 mg via ORAL
  Filled 2018-12-09 (×9): qty 1

## 2018-12-09 NOTE — Progress Notes (Signed)
Kaetlin Lasher is a 43 y.o. female 13-Aug-1975 [id number]  Subjective: No new complaints. No new problems. Slept well. Feeling OK.  Objective: Vital signs in last 24 hours: Temp:  [97.6 F (36.4 C)-98.3 F (36.8 C)] 97.8 F (36.6 C) (05/17 0451) Pulse Rate:  [95-99] 95 (05/17 0451) Resp:  [16-22] 22 (05/17 0451) BP: (123-143)/(72-83) 143/75 (05/17 0451) SpO2:  [99 %-100 %] 99 % (05/17 0451) Weight change:  Last BM Date: 12/07/18  Intake/Output from previous day: No intake/output data recorded. Last cbgs: CBG (last 3)  No results for input(s): GLUCAP in the last 72 hours.   Physical Exam General: No apparent distress   HEENT: not dry Lungs: Normal effort. Lungs clear to auscultation, no crackles or wheezes. Cardiovascular: Regular rate and rhythm, no edema Abdomen: S/NT/ND; BS(+) Musculoskeletal:  unchanged Neurological: No new neurological deficits Wounds: Dressed Skin: clear   Mental state: Alert, oriented, cooperative    Lab Results: BMET    Component Value Date/Time   NA 137 12/06/2018 0325   K 3.6 12/06/2018 0325   CL 102 12/06/2018 0325   CO2 24 12/06/2018 0325   GLUCOSE 112 (H) 12/06/2018 0325   BUN 12 12/06/2018 0325   CREATININE 0.74 12/07/2018 1817   CALCIUM 8.5 (L) 12/06/2018 0325   GFRNONAA >60 12/07/2018 1817   GFRAA >60 12/07/2018 1817   CBC    Component Value Date/Time   WBC 6.7 12/07/2018 1817   RBC 3.13 (L) 12/07/2018 1817   HGB 8.3 (L) 12/07/2018 1817   HCT 26.1 (L) 12/07/2018 1817   PLT 222 12/07/2018 1817   MCV 83.4 12/07/2018 1817   MCH 26.5 12/07/2018 1817   MCHC 31.8 12/07/2018 1817   RDW 16.8 (H) 12/07/2018 1817    Studies/Results: No results found.  Medications: I have reviewed the patient's current medications.  Assessment/Plan:  1.  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  Continue CIR 2.  DVT prophylaxis with subcutaneous heparin 3.  Pain  management with PRN oxycodone.  Robaxin for spasms.  Massage and sensory feedback 4.  Anxiety.  Xanax as needed 5.  Skin/wound care.  Ace and dry dressing to stump.  Convert to shrinker next week 6.  Acute blood loss anemia.  Follow-up CBC 7.  Constipation.  Laxative assistance    Length of stay, days: 2  Sonda Primes , MD 12/09/2018, 11:38 AM

## 2018-12-09 NOTE — Plan of Care (Signed)
  Problem: RH PAIN MANAGEMENT Goal: RH STG PAIN MANAGED AT OR BELOW PT'S PAIN GOAL Description Pain free or pain less than 3 during admission.  Outcome: Not Progressing; pt very anxious about her pain screams and shouts   Problem: RH KNOWLEDGE DEFICIT LIMB LOSS Goal: RH STG INCREASE KNOWLEDGE OF SELF CARE AFTER LIMB LOSS Outcome: Progressing; educated phantom pain ;

## 2018-12-10 ENCOUNTER — Ambulatory Visit (HOSPITAL_COMMUNITY): Payer: BLUE CROSS/BLUE SHIELD

## 2018-12-10 ENCOUNTER — Inpatient Hospital Stay (HOSPITAL_COMMUNITY): Payer: BLUE CROSS/BLUE SHIELD | Admitting: Occupational Therapy

## 2018-12-10 ENCOUNTER — Inpatient Hospital Stay (HOSPITAL_COMMUNITY): Payer: BLUE CROSS/BLUE SHIELD | Admitting: Physical Therapy

## 2018-12-10 ENCOUNTER — Encounter: Payer: BLUE CROSS/BLUE SHIELD | Admitting: Surgery

## 2018-12-10 ENCOUNTER — Encounter (HOSPITAL_COMMUNITY): Payer: Self-pay | Admitting: Surgery

## 2018-12-10 ENCOUNTER — Inpatient Hospital Stay (HOSPITAL_COMMUNITY): Payer: BLUE CROSS/BLUE SHIELD

## 2018-12-10 ENCOUNTER — Ambulatory Visit (HOSPITAL_COMMUNITY): Admission: RE | Admit: 2018-12-10 | Payer: BLUE CROSS/BLUE SHIELD | Source: Ambulatory Visit

## 2018-12-10 DIAGNOSIS — D62 Acute posthemorrhagic anemia: Secondary | ICD-10-CM

## 2018-12-10 DIAGNOSIS — G546 Phantom limb syndrome with pain: Secondary | ICD-10-CM

## 2018-12-10 MED ORDER — GABAPENTIN 300 MG PO CAPS
300.0000 mg | ORAL_CAPSULE | Freq: Three times a day (TID) | ORAL | Status: DC
  Administered 2018-12-10 – 2018-12-15 (×16): 300 mg via ORAL
  Filled 2018-12-10 (×16): qty 1

## 2018-12-10 NOTE — Anesthesia Postprocedure Evaluation (Signed)
Anesthesia Post Note  Patient: Debra Ball  Procedure(s) Performed: Thrombectomy left leg, SFA, Popliteal artery, posterior tibial artery, and anterior tibial artery, Redo popliteal exposure, posterior tibia exposure, Patch Angioplasty with xenosure Biological patch on Left tibia fibula trunk, Abdominal Aortogram with left leg runoff (Left ) Left Lower Extremity Angiogram (Left ) Thrombectomy of left SFA, popliteal artery, posterior tibial artery, and anterior tibial artery     Patient location during evaluation: PACU Anesthesia Type: General Level of consciousness: awake and alert Pain management: pain level controlled Vital Signs Assessment: post-procedure vital signs reviewed and stable Respiratory status: spontaneous breathing, nonlabored ventilation, respiratory function stable and patient connected to nasal cannula oxygen Cardiovascular status: blood pressure returned to baseline and stable Postop Assessment: no apparent nausea or vomiting Anesthetic complications: no    Last Vitals:  Vitals:   12/07/18 0540 12/07/18 0942  BP: (!) 125/55   Pulse: 92   Resp: 17 14  Temp: 37.6 C   SpO2: 96% 98%    Last Pain:  Vitals:   12/07/18 0942  TempSrc:   PainSc: 0-No pain                 Vega Withrow

## 2018-12-10 NOTE — Progress Notes (Signed)
Inpatient Rehabilitation  Patient information reviewed and entered into eRehab system by Murel Shenberger M. Shalona Harbour, M.A., CCC/SLP, PPS Coordinator.  Information including medical coding, functional ability and quality indicators will be reviewed and updated through discharge.    

## 2018-12-10 NOTE — Progress Notes (Signed)
[facility] PHYSICAL MEDICINE & REHABILITATION PROGRESS NOTE  Subjective/Complaints: Patient seen laying in bed this morning.  She can be heard in the hallways moaning and screaming.  Upon arrival, she has her headphones in and is screaming.  She states she slept well overnight.  She states she has numbness and then states she has pain.  ROS: + Phantom limb pain.  Denies CP, shortness of breath, nausea, vomiting, diarrhea.  Objective: Vital Signs: Blood pressure 136/79, pulse 99, temperature 98.2 F (36.8 C), resp. rate 17, height 5\' 1"  (1.549 m), weight 82.3 kg, last menstrual period 11/26/2018, SpO2 99 %. No results found. Recent Labs    12/07/18 1817  WBC 6.7  HGB 8.3*  HCT 26.1*  PLT 222   Recent Labs    12/07/18 1817  CREATININE 0.74    Physical Exam: BP 136/79 (BP Location: Left Arm)   Pulse 99   Temp 98.2 F (36.8 C)   Resp 17   Ht 5\' 1"  (1.549 m)   Wt 82.3 kg   LMP 11/26/2018 (Approximate) Comment: tubal ligation  SpO2 99%   BMI 34.28 kg/m  Constitutional: + Distressed. Vital signs reviewed. HENT: Normocephalic.  Atraumatic. Eyes: EOMI. No discharge. Cardiovascular: No JVD. Respiratory: Normal effort. GI: Non-distended. Musc: Left BKA with edema and tenderness with minimal touch. Neurological: Alert Motor: Bilateral upper extremities: 5/5 proximal distal Right lower extremity: 4+/5 proximal distal Left lower extremity: Limited due to significant pain Sensation intact light touch Skin: BKA site with staples C/D/I  Psychiatric: Atypical affect  Assessment/Plan: 1. Functional deficits secondary to left BKA which require 3+ hours per day of interdisciplinary therapy in a comprehensive inpatient rehab setting.  Physiatrist is providing close team supervision and 24 hour management of active medical problems listed below.  Physiatrist and rehab team continue to assess barriers to discharge/monitor patient progress toward functional and medical goals  Care  Tool:  Bathing    Body parts bathed by patient: Right arm, Left arm, Chest, Abdomen, Front perineal area, Buttocks, Right upper leg, Left upper leg, Right lower leg, Face     Body parts n/a: Left lower leg   Bathing assist Assist Level: Contact Guard/Touching assist     Upper Body Dressing/Undressing Upper body dressing   What is the patient wearing?: Pull over shirt    Upper body assist Assist Level: Set up assist    Lower Body Dressing/Undressing Lower body dressing      What is the patient wearing?: Pants     Lower body assist Assist for lower body dressing: Minimal Assistance - Patient > 75%     Toileting Toileting    Toileting assist Assist for toileting: Minimal Assistance - Patient > 75%     Transfers Chair/bed transfer  Transfers assist     Chair/bed transfer assist level: Contact Guard/Touching assist     Locomotion Ambulation   Ambulation assist   Ambulation activity did not occur: Safety/medical concerns  Assist level: Minimal Assistance - Patient > 75% Assistive device: Walker-rolling Max distance: 75ft   Walk 10 feet activity   Assist  Walk 10 feet activity did not occur: Safety/medical concerns  Assist level: Minimal Assistance - Patient > 75% Assistive device: Walker-rolling   Walk 50 feet activity   Assist Walk 50 feet with 2 turns activity did not occur: Safety/medical concerns         Walk 150 feet activity   Assist Walk 150 feet activity did not occur: Safety/medical concerns  Walk 10 feet on uneven surface  activity   Assist Walk 10 feet on uneven surfaces activity did not occur: Safety/medical concerns         Wheelchair     Assist Will patient use wheelchair at discharge?: Yes Type of Wheelchair: Manual Wheelchair activity did not occur: Safety/medical concerns         Wheelchair 50 feet with 2 turns activity    Assist    Wheelchair 50 feet with 2 turns activity did not occur:  Safety/medical concerns       Wheelchair 150 feet activity     Assist Wheelchair 150 feet activity did not occur: Safety/medical concerns          Medical Problem List and Plan: 1.Decreased functional mobilitysecondary to left BKA 12/04/2018 after failed revascularization procedure  Continue CIR  Notes reviewed- ischemia with failed thrombectomy, labs reviewed 2. Antithrombotics: -DVT/anticoagulation:Subcutaneous heparin -antiplatelet therapy: N/A 3. Pain Management:Oxycodone as needed -robaxin prn spasms -massage and sensory feedback  Gabapentin 300 3 times daily started on 5/18 for uncontrolled phantom limb pain  Will require significant encouragement 4. Mood:Xanax 0.25-0.5 mg 3 times daily as needed -antipsychotic agents: N/A 5. Neuropsych: This patientiscapable of making decisions on herown behalf. 6. Skin/Wound Care:Routine skin checks  Stump shrinker ordered 7. Fluids/Electrolytes/Nutrition:Routine in and outs   BMP within acceptable range on 5/14  Encourage PO 8.Acute blood loss anemia.   Hb 8.3 on 5/15  Cont to monitor 9.Constipation. Laxative assistance   LOS: 3 days A FACE TO FACE EVALUATION WAS PERFORMED  Ovetta Bazzano [name] Delbert Vu 12/10/2018, 9:06 AM

## 2018-12-10 NOTE — Progress Notes (Signed)
Physical Therapy Session Note  Patient Details  Name: Debra Ball MRN: [id number] Date of Birth: 08/18/1975  Today's Date: 12/10/2018 PT Individual Time: 0300-9233 PT Individual Time Calculation (min): 39 min   Short Term Goals: Week 1:  PT Short Term Goal 1 (Week 1): =LTG due to ELOS  Skilled Therapeutic Interventions/Progress Updates:    pt rec'd in w/c, agreeable to therapy. Pt propels w/c in controlled environment with supervision.  Home environment w/c mobility with increased time and supervision for tight spaces and positioning for transfers.  Squat pivot transfers throughout session with supervision.  Seated LE therex for Lt LE with LAQ, hip abd/add, add squeeze and HS curl all 2 x 12.  Core strengthening with 2kg theraball for diagonals 2 x 10 bilat. Pt left in w/c with all needs at hand.  Therapy Documentation Precautions:  Precautions Precautions: Fall Precaution Comments: new L BKA Restrictions Weight Bearing Restrictions: Yes LLE Weight Bearing: Non weight bearing Pain: Pt c/o phantom pain when repositioning limb, eases with cues for deep breathing and time   Therapy/Group: Individual Therapy  [name] 12/10/2018, 4:39 PM

## 2018-12-10 NOTE — Accreditation Note (Signed)
Called [employer] for stump shrinkers.

## 2018-12-10 NOTE — Progress Notes (Signed)
Occupational Therapy Session Note  Patient Details  Name: Debra Ball MRN: [id number] Date of Birth: 01-09-1976  Today's Date: 12/10/2018 OT Individual Time: 9672-2773 OT Individual Time Calculation (min): 70 min    Short Term Goals: Week 1:  OT Short Term Goal 1 (Week 1): na STG = LTG  Skilled Therapeutic Interventions/Progress Updates:    Upon entering room pt yelling in pain and shaking arms rigidly- RN aware. Pt requesting to use BSC but unable to calm down. Pt provided with edu sheets re phantom pain and desensitization techniques, as well as mirror obtained. Following break and calming cues/breathing cues, pt able to transition to EOB with (S). Pt's residual limb was wrapped with gauze as she declined ace wrapping at this time despite edu. Pt completed stand pivot transfer to [facility] with CGA. Pt voided urine and completed peri hygiene with CGA. Mirror was set up and pt guided through mirror therapy techniques for phantom limb pain. Pt interested and taking notes. Pt completed transfer to w/c, CGA, and was left sitting up with all needs met, chair belt fastened.   Therapy Documentation Precautions:  Precautions Precautions: Fall Precaution Comments: new L BKA Restrictions Weight Bearing Restrictions: Yes LLE Weight Bearing: Non weight bearing   Therapy/Group: Individual Therapy  Curtis Sites 12/10/2018, 7:20 AM

## 2018-12-10 NOTE — IPOC Note (Signed)
Overall Plan of Care [facility]) Patient Details Name: Debra Ball MRN: [id number] DOB: 01/01/1976  Admitting Diagnosis: <principal problem not specified>  Hospital Problems: Active Problems:   Left below-knee amputee (HCC)   Acute blood loss anemia   Phantom limb pain (HCC)     Functional Problem List: Nursing Bowel, Pain, Safety, Skin Integrity  PT Balance, Behavior, Endurance, Pain, Safety  OT Balance, Endurance, Pain  SLP    TR         Basic ADL's: OT Bathing, Dressing, Toileting     Advanced  ADL's: OT Simple Meal Preparation, Light Housekeeping     Transfers: PT Bed Mobility, Bed to Chair, Car, Furniture, Floor  OT Toilet, Research scientist (life sciences): PT Ambulation, Psychologist, prison and probation services, Stairs     Additional Impairments: OT    SLP        TR      Anticipated Outcomes Item Anticipated Outcome  Self Feeding independent  Swallowing      Basic self-care  mod I  Toileting  mod I   Bathroom Transfers mod I  Bowel/Bladder  Patient to continue to be continent of bowel and bladder during admission  Transfers  mod I  Locomotion  mod I at w/c level  Communication     Cognition     Pain  Patient will be pain free or pain less than 3 during admission  Safety/Judgment  Patient to be free from falls and adhere to safety plan    Therapy Plan: PT Intensity: Minimum of 1-2 x/day ,45 to 90 minutes PT Frequency: 5 out of 7 days PT Duration Estimated Length of Stay: 7-10 OT Intensity: Minimum of 1-2 x/day, 45 to 90 minutes OT Frequency: 5 out of 7 days OT Duration/Estimated Length of Stay: 1 week     Due to the current state of emergency, patients may not be receiving their 3-hours of Medicare-mandated therapy.   Team Interventions: Nursing Interventions Patient/Family Education, Bowel Management, Pain Management, Skin Care/Wound Management, Psychosocial Support, Medication Management, Disease Management/Prevention  PT interventions Ambulation/gait  training, Warden/ranger, Cognitive remediation/compensation, Community reintegration, Discharge planning, Disease management/prevention, DME/adaptive equipment instruction, Functional mobility training, Pain management, Patient/family education, Psychosocial support, Stair training, Therapeutic Activities, Therapeutic Exercise, UE/LE Strength taining/ROM, UE/LE Coordination activities, Wheelchair propulsion/positioning  OT Interventions Balance/vestibular training, Discharge planning, Pain management, Self Care/advanced ADL retraining, Therapeutic Activities, Functional mobility training, Patient/family education, Skin care/wound managment, DME/adaptive equipment instruction  SLP Interventions    TR Interventions    SW/CM Interventions Discharge Planning, Psychosocial Support, Patient/Family Education   Barriers to Discharge MD  Medical stability, Wound care, Weight, Weight bearing restrictions, Behavior and Controlled pain  Nursing      PT Inaccessible home environment, Medical stability, Home environment access/layout, Behavior    OT      SLP      SW       Team Discharge Planning: Destination: PT-Home ,OT- Home , SLP-  Projected Follow-up: PT-Home health PT, OT-  None, SLP-  Projected Equipment Needs: PT-Wheelchair (measurements), Wheelchair cushion (measurements), Rolling walker with 5" wheels, OT- 3 in 1 bedside comode, Tub/shower bench, SLP-  Equipment Details: PT-size TBD, OT-  Patient/family involved in discharge planning: PT- Patient,  OT-Patient, SLP-   MD ELOS: 6-8 days. Medical Rehab Prognosis:  Good Assessment: 43 year old right-handed female with long history of peripheral vascular disease left popliteal artery occlusion underwent thrombectomy 11/07/2018. She was discharged home on Eliquis. [facility] to her left foot.  She no longer had palpable pulses and [facility] Underwent  redo left below-knee popliteal artery exposure thrombectomy of left superficial femoral-popliteal anterior tibial and posterior tibial artery 12/01/2018 per Dr. Myra GianottiBrabham. 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 Edilia Boickson. Acute blood loss anemia, patient has been transfused.  Patient resulting functional deficits with mobility, transfers, self-care.  We will set goals for mod I with PT/OT.  See Team Conference Notes for weekly updates to the plan of care

## 2018-12-10 NOTE — Progress Notes (Signed)
Social Work  Social Work Assessment and Plan  Patient Details  Name: Debra Ball MRN: [id number] Date of Birth: [birth year]  Today's Date: 12/10/2018  Problem List:  Patient Active Problem List   Diagnosis Date Noted  . Reactive hypertension   . Sinus tachycardia   . Postoperative pain   . Therapeutic opioid induced constipation   . Acute blood loss anemia   . Phantom limb pain (HCC)   . Left below-knee amputee (HCC) 12/07/2018  . Ischemia of left lower extremity 12/02/2018  . Critical lower limb ischemia 12/02/2018  . 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 Pregnancy induced hypertension    Past Surgical History:  Past Surgical History:  Procedure Laterality Date  . AMPUTATION Left 12/04/2018   Procedure: LEFT BELOW KNEE AMPUTATION;  Surgeon: Chuck Hintickson, Christopher S, MD;  [facility];  Service: Vascular;  Laterality: Left;  . AORTOGRAM Left 12/01/2018   Procedure: Abdominal Aortogram with left leg runoff;  Surgeon: Nada LibmanBrabham, Vance W, MD;  [facility];  Service: Vascular;  Laterality: Left;  . FEMORAL-TIBIAL BYPASS GRAFT Left 11/07/2018   Procedure: THROMBECTOMY POPLITEAL, ANTERIOR TIBIAL ARTERY, POSTERIOR TIBIAL ARTERY, AND VEIN PATCH ANTERIOR ARTERY;  Surgeon: Nada LibmanBrabham, Vance W, MD;  Location: [facility] OR;  Service: Vascular;  Laterality: Left;  . LOWER EXTREMITY ANGIOGRAM Left 12/02/2018   Procedure: Left Lower Extremity Angiogram;  Surgeon: Nada LibmanBrabham, Vance W, MD;  [facility];  Service: Vascular;  Laterality: Left;  . LOWER EXTREMITY ANGIOGRAPHY Bilateral 11/06/2018   Procedure: LOWER EXTREMITY ANGIOGRAPHY;  Surgeon: Nada LibmanBrabham, Vance W, MD;  Location: [facility] INVASIVE CV LAB;  Service: Cardiovascular;  Laterality: Bilateral;  And ABD   . PATCH ANGIOPLASTY Left 12/01/2018   Procedure: Patch Angioplasty with xenosure Biological patch on Left tibia fibula trunk;  Surgeon: Nada LibmanBrabham, Vance W, MD;  [facility];  Service: Vascular;  Laterality: Left;  . TEE WITHOUT CARDIOVERSION  12/01/2018   Procedure: Transesophageal Echocardiogram (Tee);  Surgeon: Nada LibmanBrabham, Vance W, MD;  [facility];  Service: Vascular;;  . THROMBECTOMY FEMORAL ARTERY Left 12/01/2018   Procedure: Thrombectomy left leg, SFA, Popliteal artery, posterior tibial artery, and anterior itbial artery, Redo popliteal exposure, posterior tibia exposure;  Surgeon: Nada LibmanBrabham, Vance W, MD;  Location: [facility] OR;  Service: Vascular;  Laterality: Left;  . THROMBECTOMY FEMORAL ARTERY Left 12/02/2018   Procedure: Thrombectomy left leg, SFA, Popliteal artery, posterior tibial artery, and anterior tibial artery, Redo popliteal exposure, posterior tibia exposure, Patch Angioplasty with xenosure Biological patch on Left tibia fibula trunk, Abdominal Aortogram with left leg runoff;  Surgeon: Nada LibmanBrabham, Vance W, MD;  Location: [facility] OR;  Service: Vascular;  Laterality: Left;  . THROMBECTOMY FEMORAL ARTERY  12/02/2018   Procedure: Thrombectomy of left SFA, popliteal artery, posterior tibial artery, and anterior tibial artery;  Surgeon: Nada LibmanBrabham, Vance W, MD;  Location: [facility] OR;  Service: Vascular;;   Social History:  reports that she has been smoking cigarettes. She has never used smokeless tobacco. She reports current alcohol use. She reports that she does not use drugs.  Family / Support Systems Marital Status: Married How Long?: 2009 Patient Roles: Spouse, Parent Spouse/Significant Other: spouse, Debra Ball @ [phone] Children: Pt has 6 children ages 721, 9118,17,15,12 and 539 yrs old with the youngest 4 living in the home.  Eldest 2 daughters are local. Anticipated Caregiver: spouse and daughter, Shirlee MoreMartisa Ability/Limitations of Caregiver: daughter and spouse to rearrange schedules to work  opposite each other Caregiver Availability: 24/7 Family Dynamics: Pt reports good relationship with all family and describes them as very supportive and "will do whatever I  need."  Social History Preferred language: English Religion: None Cultural Background: NA Read: Yes Write: Yes Employment Status: Employed Name of Employer: [employer] since Aug 2019 Return to Work Plans: TBD  Marine scientist Issues: None Guardian/Conservator: None - per MD, pt is capable of making decisions on her own behalf.   Abuse/Neglect Abuse/Neglect Assessment Can Be Completed: Yes Physical Abuse: Denies Verbal Abuse: Denies Sexual Abuse: Denies Exploitation of patient/patient's resources: Denies Self-Neglect: Denies  Emotional Status Pt's affect, behavior and adjustment status: Pt sitting up in w/c and very restless and moving about throughout the interview.  Her speech was a little slurred but she was able to complete assessment interview.  Pt admits she is frustrated with her situation and with ongoing phantom pain.  Dependent on LOS, she may benefit from neuropsychology support - will monitor.  Recent Psychosocial Issues: Has been out of work since April due to medical issues.  Financial stressors. Psychiatric History: None Substance Abuse History: None  Patient / Family Perceptions, Expectations & Goals Pt/Family understanding of illness & functional limitations: Pt and spouse with general understanding of her medical issues that, ultimately, resulted in need for BKA.  Also, understanding of her new functional limitations, anticipated home mod needs and f/u supports. Premorbid pt/family roles/activities: Pt was relatively independent PTA but declining mobility since April. Anticipated changes in roles/activities/participation: Spouse and daughters will likely need to provide some minimal level of physical assistance. Pt/family expectations/goals: "I just want this pain to stop."  [employer]: None Premorbid Home Care/DME Agencies: None Transportation available at discharge: yes Resource referrals recommended: Neuropsychology,  Support group (specify)  Discharge Planning Living Arrangements: Spouse/significant other, Children Support Systems: Spouse/significant other, Children Type of Residence: Private residence Insurance Resources: Media planner (specify)([employer] of [place]) Financial Resources: Employment Financial Screen Referred: No Living Expenses: Psychologist, sport and exercise Management: Spouse, Patient Does the patient have any problems obtaining your medications?: No Home Management: Pt and family sharing responsibilities. Patient/Family Preliminary Plans: Pt to return home with family coordinating 24/7 support. Sw Barriers to Discharge: Inaccessible home environment Sw Barriers to Discharge Comments: Pt and spouse aware of need for ramp and he is getting this coordinated. Social Work Anticipated Follow Up Needs: HH/OP Expected length of stay: ELOS 7 to 10 days  Clinical Impression Unfortunate woman here following prior attempts to salvage limb and now with BKA.  Good family support and spouse aware of need for ramp.  Pt appears very restless and c/o ongoing phantom pain.  Admits frustration with overall situation.  If LOS allows, may benefit from neuropsychology consult.  Will follow for d/c planning and support needs.  Nyoka Alcoser 12/10/2018, 4:38 PM

## 2018-12-10 NOTE — Progress Notes (Signed)
Occupational Therapy Session Note  Patient Details  Name: Debra Ball MRN: [id number] Date of Birth: [birth year]  Today's Date: 12/10/2018 OT Individual Time: 1500-1540 OT Individual Time Calculation (min): 40 min    Short Term Goals: Week 1:  OT Short Term Goal 1 (Week 1): na STG = LTG  Skilled Therapeutic Interventions/Progress Updates:    Patient asleep in bed, takes a few moments to wake up but agreeable to participate in therapy session.  Bed mobility with CS.  Short distance Ambulation in room with RW and SPT to/from bed, toilet and w/c with CS/CG A.  Toileting CS, grooming in stance at sink CS/CG.  Patient able to propel w/c to and from therapy treatment space.  Reviewed options for tub/shower and future process for showering once she has a prosthesis (she did become tearful asking "will I have to use one of these forever?")  Completed dry tub/shower transfer with transfer bench CS/CG.  She states that she may have a bench to use from a family member and will call this afternoon to confirm.  She returned to room, remained seated in w/c with seat belt alarm set and callbell/tray table in reach.    Therapy Documentation Precautions:  Precautions Precautions: Fall Precaution Comments: new L BKA Restrictions Weight Bearing Restrictions: Yes LLE Weight Bearing: Non weight bearing General:   Vital Signs: Therapy Vitals Temp: 98.3 F (36.8 C) Pulse Rate: (!) 107 Resp: 20 BP: (!) 162/96 Patient Position (if appropriate): Sitting Oxygen Therapy SpO2: 100 % O2 Device: Room Air Pain: Pain Assessment Pain Scale: 0-10 Pain Score: 4  Pain Type: Surgical pain;Neuropathic pain Pain Location: Leg Pain Orientation: Left Pain Descriptors / Indicators: Tightness Pain Frequency: Constant Pain Onset: On-going Patients Stated Pain Goal: 4 Pain Intervention(s): Repositioned   Other Treatments:     Therapy/Group: Individual Therapy  Barrie Lyme 12/10/2018, 4:03 PM

## 2018-12-10 NOTE — Progress Notes (Signed)
Occupational Therapy Session Note  Patient Details  Name: Debra Ball MRN: [id number] Date of Birth: 1976-06-21  Today's Date: 12/10/2018 OT Individual Time: 1034-1130 OT Individual Time Calculation (min): 56 min   Short Term Goals: Week 1:  OT Short Term Goal 1 (Week 1): na STG = LTG  Skilled Therapeutic Interventions/Progress Updates:    Pt greeted sitting in wc and agreeable to OT treatment session. Nursing entered to administer pain medication, then pt agreeable to shower. Educated on use of bag to act as waterproof cover to residual limb. Pt assisted with taping around bag. OT obtained shower chair, then pt propelled wc into bathroom with min A to get over bathroom edge. Pt completed stand-pivot into shower with use of grab bars and  CGA. Bathing completed with verbal cues for technique and safety. Sit<>stand to wash buttocks with CGA for balance. Stand-pivot back out of shower in similar fashion. Pt completed dressing from wc at the sink with CGA. Worked on standing balance/endurance with standing grooming task-pt tolerated standing for 2 mins. OT educated on importance of maintaining knee extension and built up wc residual limb rest to achieve more knee extension. Pt left seated in wc at end of session with needs met.   Therapy Documentation Precautions:  Precautions Precautions: Fall Precaution Comments: new L BKA Restrictions Weight Bearing Restrictions: Yes LLE Weight Bearing: Non weight bearing Pain: Pain Assessment Pain Scale: 0-10 Pain Score: 7  Pain Type: Surgical pain;Neuropathic pain Pain Location: Leg Pain Orientation: Left Pain Descriptors / Indicators: Aching Pain Onset: On-going Pain Intervention(s): RN made aware;Repositioned   Therapy/Group: Individual Therapy  Valma Cava 12/10/2018, 12:49 PM

## 2018-12-11 ENCOUNTER — Inpatient Hospital Stay (HOSPITAL_COMMUNITY): Payer: BLUE CROSS/BLUE SHIELD

## 2018-12-11 ENCOUNTER — Inpatient Hospital Stay (HOSPITAL_COMMUNITY): Payer: BLUE CROSS/BLUE SHIELD | Admitting: Occupational Therapy

## 2018-12-11 ENCOUNTER — Telehealth: Payer: Self-pay | Admitting: Vascular Surgery

## 2018-12-11 ENCOUNTER — Inpatient Hospital Stay (HOSPITAL_COMMUNITY): Payer: BLUE CROSS/BLUE SHIELD | Admitting: Physical Therapy

## 2018-12-11 DIAGNOSIS — T402X5A Adverse effect of other opioids, initial encounter: Secondary | ICD-10-CM

## 2018-12-11 DIAGNOSIS — K5903 Drug induced constipation: Secondary | ICD-10-CM

## 2018-12-11 NOTE — Plan of Care (Signed)
  Problem: Consults Goal: RH LIMB LOSS PATIENT EDUCATION Description Description: See Patient Education module for eduction specifics. Outcome: Progressing Goal: Skin Care Protocol Initiated - if Braden Score 18 or less Description If consults are not indicated, leave blank or document N/A Outcome: Progressing Goal: Nutrition Consult-if indicated Outcome: Progressing Goal: RH LIMB LOSS PATIENT EDUCATION Description Description: See Patient Education module for eduction specifics. Outcome: Progressing   Problem: RH BOWEL ELIMINATION Goal: RH STG MANAGE BOWEL W/MEDICATION W/ASSISTANCE Description STG Manage Bowel with Medication Min with Assistance.  Outcome: Progressing   Problem: RH SKIN INTEGRITY Goal: RH STG SKIN FREE OF INFECTION/BREAKDOWN Description Monitor incision and for skin breakdown during admission  Outcome: Progressing Goal: RH STG MAINTAIN SKIN INTEGRITY WITH ASSISTANCE Description STG Maintain Skin Integrity With Assistance. Outcome: Progressing   Problem: RH SAFETY Goal: RH STG ADHERE TO SAFETY PRECAUTIONS W/ASSISTANCE/DEVICE Description STG Adhere to Safety Precautions With Min Assistance/Device.  Outcome: Progressing   Problem: RH KNOWLEDGE DEFICIT LIMB LOSS Goal: RH STG INCREASE KNOWLEDGE OF SELF CARE AFTER LIMB LOSS Outcome: Progressing   Problem: RH PAIN MANAGEMENT Goal: RH STG PAIN MANAGED AT OR BELOW PT'S PAIN GOAL Description Pain free or pain less than 3 during admission.  Outcome: Not Progressing

## 2018-12-11 NOTE — Progress Notes (Signed)
Physical Therapy Session Note  Patient Details  Name: Debra Ball MRN: [id number] Date of Birth: 04-Mar-1976  Today's Date: 12/11/2018 PT Individual Time: 0832-0938 PT Individual Time Calculation (min): 66 min   Short Term Goals: Week 1:  PT Short Term Goal 1 (Week 1): =LTG due to ELOS  Skilled Therapeutic Interventions/Progress Updates:    initial part of session focused on education in regards to phantom pain/desensitization techniques, prosthetic education, importance of ROM, mirror therapy options, and d/c planning. Pt performed functional bed mobility with supervision to come to EOB for safety (decreased awareness when reaching outside BOS) and high fall risk. Pt able to roll and perform sit to supine independently. CGA for sit <> stand without AD (using back of bed to brace with leg) while pulling up pants and min assist for balance. Pt somewhat impulsive initially with mobility during session but improved throughout and education on importance of slowing down to check if brakes locked, positioning or use of RW, w/c positioning, etc. Performed CGA squat pivot transfer to w/c and perform oral hygiene and grooming at sink from w/c level while discussing d/c planning and goals. Pt reports husband is working on ramp (had estimate yesterday and scheduled for today) with plan to build prior to d/c. Pt reports home would be accessible via w/c once inside or could do small distance gait into bathroom etc. Focused on bed <> w/c transfer with high bed like she has at home for stand pivot technique with steadying assist and cues for safety. Pt will benefit from further practice with w/c parts management and set up for transfer. Performed w/c propulsion with supervision for functional mobility and overall strengthening and endurance x 150'. Short distance gait training x 30' x  2 trials with RW with CGA/min assist with good technique. Educated on contralateral limb preservation and increased risk due  to unequal load. End of session transferred back to bed with CGA and poor carryover of correct technique. All needs in reach.   Therapy Documentation Precautions:  Precautions Precautions: Fall Precaution Comments: new L BKA Restrictions Weight Bearing Restrictions: Yes LLE Weight Bearing: Non weight bearing  Pain:  premedicated. Reports pain in L residual limb (incisional and phantom pain) but did not rate.     Therapy/Group: Individual Therapy  Karolee Stamps Darrol Poke, PT, DPT, CBIS  12/11/2018, 9:50 AM

## 2018-12-11 NOTE — Discharge Summary (Signed)
[facility] Discharge Summary   Patient ID:  Debra Ball MRN: [id number] DOB/AGE: 02-11-1976 43 y.o.  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 Pregnancy induced hypertension     Procedure(s): LEFT BELOW KNEE AMPUTATION  Discharged Condition: good  HPI: This is a 43 y.o. female with history of chronic tobacco abuse that presents as a transfer from [place] for evaluation of left popliteal artery occlusion.  Patient was seen in the ED there complaining of left knee pain with pain that radiated down to her calf and although Doppler signals were identified a CTA of her left lower extremity was ordered.  CTA showed a occlusion of her left popliteal artery with occlusion extending into the tibioperoneal trunk and proximal posterior tibial artery.  She was taken to the operating room on 11/07/2018 and underwent thrombectomy of the left popliteal, anterior tibial, and posterior tibial artery with vein patch angioplasty of the left popliteal artery.  Following the procedure, she had a palpable posterior tibial pulse.   12/03/2018 This morning she had no signals in left foot, no sensation in the left foot, and had profound motor deficit.  No additional options for revascularization as previously noted.  Patient is now agreeable to left below-knee amputation.  Scheduled tomorrow with Dr. Edilia Bo.    Hospital Course:  Debra Ball is a 43 y.o. female is S/P Left Procedure(s): LEFT BELOW KNEE AMPUTATION   The left BKA stump was healing well and appears viable.  She had surgical blood loss anemia and received a total of 3 units of PRBC during her admission.  Once pain was controlled she was discharged to [facility] for  rehabilitation.     Significant Diagnostic Studies: CBC Lab Results  Component Value Date   WBC 6.7 12/07/2018   HGB 8.3 (L) 12/07/2018   HCT 26.1 (L) 12/07/2018   MCV 83.4 12/07/2018   PLT 222 12/07/2018    BMET    Component Value Date/Time   NA 137 12/06/2018 0325   K 3.6 12/06/2018 0325   CL 102 12/06/2018 0325   CO2 24 12/06/2018 0325   GLUCOSE 112 (H) 12/06/2018 0325   BUN 12 12/06/2018 0325   CREATININE 0.74 12/07/2018 1817   CALCIUM 8.5 (L) 12/06/2018 0325   GFRNONAA >60 12/07/2018 1817   GFRAA >60 12/07/2018 1817   COAG Lab Results  Component Value Date   INR 1.0 11/06/2018     Disposition:  Discharge to :Rehab  Allergies as of 12/07/2018      Reactions   Hydrocodone Bitartrate Er Hives      Medication List    ASK your doctor about these medications   acetaminophen 650 MG CR tablet Commonly known as:  TYLENOL Take 1,300 mg by mouth every 8 (eight) hours as needed for pain.   apixaban 5 MG Tabs tablet Commonly known as:  ELIQUIS Take 2 tablets (10 mg total) by mouth 2 (two) times daily. Ask about: Which instructions should I use?   traMADol 50 MG tablet Commonly known as:  Ultram Take 1 tablet (50 mg total) by mouth every 6 (six) hours as needed.      Verbal and written Discharge instructions given to the patient. Wound care per Discharge AVS Follow-up Information    Chuck Hint, MD Follow up in  4 week(s).   Specialties:  Vascular Surgery, Cardiology Contact information: [address] [place] [address] [phone]           Signed: Mosetta Pigeonmma Maureen Ellie Spickler 12/11/2018, 10:25 AM

## 2018-12-11 NOTE — Progress Notes (Signed)
Patient reports pain 8/10 and PRN medications given. Patient educated on the side effects of the pain medication and what to do to prevent constipation. Patient verbalized understanding. Patient called out at 1:40 a.m. crying and yelling. Pain medication administered. Limb elevated and calming techniques taught. Patient continued to yell. Dressing is dry and intact. Will continue to monitor.

## 2018-12-11 NOTE — Telephone Encounter (Signed)
sch appt spk to pt mld ltr 01/09/2019 1120am p/o MD

## 2018-12-11 NOTE — Telephone Encounter (Signed)
-----   Message from Lars Mage, [place] sent at 12/11/2018 10:24 AM EDT -----  S/p left LE amputation f/u in 4 weeks with Dr. Edilia Bo

## 2018-12-11 NOTE — Progress Notes (Signed)
Occupational Therapy Session Note  Patient Details  Name: Debra Ball MRN: [id number] Date of Birth: July 12, 1976  Today's Date: 12/11/2018 OT Individual Time: 2300-9794 OT Individual Time Calculation (min): 45 min    Short Term Goals: Week 1:  OT Short Term Goal 1 (Week 1): na STG = LTG  Skilled Therapeutic Interventions/Progress Updates:    Pt greeted sitting EOB and agreeable to OT treatment session. Pt requests to shower today. OT assisted with wrapping residual limb in waterproof cover prior to shower. Pt propelled wc into bathroom and needed verbal cues for wc positioning at shower seat for stand-pivot. CGA to pivot into shower. Bathing completed with CGA for balance when standing to wash buttocks. Dressing completed wc at the sink with Set-up A/CGA again for balance when standing to pull down gown. Pt also needed cues for safety to lock wc breaks prior to standing. Pt showed OT picture of her mother tub bench which she will be able to borrow at dc. OT exchanged limb rest and positioned L residual limb with improved knee extension. Pt left seated in wc with safety belt on and needs met.   Therapy Documentation Precautions:  Precautions Precautions: Fall Precaution Comments: new L BKA Restrictions Weight Bearing Restrictions: Yes LLE Weight Bearing: Non weight bearing Pain: Denies pain  Therapy/Group: Individual Therapy  Valma Cava 12/11/2018, 11:56 AM

## 2018-12-11 NOTE — Progress Notes (Signed)
Physical Therapy Session Note  Patient Details  Name: Debra Ball MRN: [id number] Date of Birth: [birth year]  Today's Date: 12/11/2018 PT Individual Time: 1015-1127 PT Individual Time Calculation (min): 72 min   Short Term Goals: Week 1:  PT Short Term Goal 1 (Week 1): =LTG due to ELOS  Skilled Therapeutic Interventions/Progress Updates:   Therapy Documentation Precautions:  Precautions Precautions: Fall Precaution Comments: new L BKA Restrictions Weight Bearing Restrictions: Yes LLE Weight Bearing: Non weight bearing   General: Pt in bed on arrival and agreed to PT at this time. Reports having pain in her "toes" on the amputated side. Reports she has had her pain medicine and it's not due until the time wrote on her dry erase board.  Bed Mobility: Pt able to bring her self from supine to sitting at edge of bed with bed flat and no rail used. Pt able to scoot her self along edge of bed with UE support.  Transfers: min guard assist for sit<>stand tranfers with cues for correct hand placement for safety (tends to want both hands on RW). Min guard assist for stand pivot transfers in session x 3 reps.   Exercises/NMR: with left residual limb- supine in bed: quad sets x10 active, straight leg raises with assist to keep knee straight x 10 reps, hip abduction/adduciton x 10 reps active; seated at edge of bed: knee flexion /extension x 10 reps, hip flexion x 10 reps; standing with RW support: hip flexion x 10 reps, hip extension x 10 reps, and hip abd/adduction x 10 reps; lying prone for 6 minutes on mat table then progressed to working on knee flexion/extenxion for 10 reps, 2 sets, then hip extension with knee extension for 10 reps, 2 sets; Long sitting on mat with LE's supported on mat table: with 4# weighted ball pt performed bil UE raises x 10 reps, then upper trunk rotation left<>right x 10 reps each side.   W/C mobility: pt propelled herself toward gym for 100 feet, then needed  a rest due to bil finger pain (reports of tingling/buring) and PTA completed the route to the gym. Pt was able to propel back to her room for 150 feet total with supervision.  Gait: 10 feet x1, then 30 feet x1 with RW with cues for use of UE's to lift her self up vs "hopping", min guard to min assist for balance (increased assistance with 180 degree turns).   Amputee education: pt educated on phamtom limb pain vs sensations. Also provided pt resource site of [employer]. Reviewed site with her and looked at all possible resources the site has to offer.   Pt left seated in bed with all needs in reach and bed alarm on. Pt did have intermittent pain in her residual limb with session that was relieved by manually rubbing the tight muscles that were hurting. Pt was shown how to do this and encouraged to do this as she reports  "I don't want to touch it, I may hurt it". Pt did report feeling more comfortable with this technique by end of session.      Therapy/Group: Individual Therapy  Livingston Diones, PTA, CLT 12/11/18, 5:34 PM 12/11/2018, 5:34 PM

## 2018-12-11 NOTE — Progress Notes (Signed)
[place] PHYSICAL MEDICINE & REHABILITATION PROGRESS NOTE  Subjective/Complaints: Patient seen sitting up in a chair this morning.  She states she slept well overnight.  She notes improvement in pain.  She makes limited eye contact.  ROS: Denies CP, shortness of breath, nausea, vomiting, diarrhea.  Objective: Vital Signs: Blood pressure (!) 158/99, pulse (!) 107, temperature (!) 97.4 F (36.3 C), temperature source Oral, resp. rate 18, height 5\' 1"  (1.549 m), weight 82.3 kg, last menstrual period 11/26/2018, SpO2 98 %. No results found. No results for input(s): WBC, HGB, HCT, PLT in the last 72 hours. No results for input(s): NA, K, CL, CO2, GLUCOSE, BUN, CREATININE, CALCIUM in the last 72 hours.  Physical Exam: BP (!) 158/99 (BP Location: Right Arm)   Pulse (!) 107   Temp (!) 97.4 F (36.3 C) (Oral)   Resp 18   Ht 5\' 1"  (1.549 m)   Wt 82.3 kg   LMP 11/26/2018 (Approximate) Comment: tubal ligation  SpO2 98%   BMI 34.28 kg/m  Constitutional: NAD. Vital signs reviewed. HENT: Normocephalic.  Atraumatic. Eyes: EOMI.  No discharge. Cardiovascular: No JVD. Respiratory: Normal effort. GI: Non-distended. Musc: Left BKA with edema and tenderness with minimal touch. Neurological: Alert Motor:  Right lower extremity: 4+/5 proximal distal Left lower extremity: Hip flexion: 4/5 (pain inhibition) Skin: BKA site with dressing C/D/I  Psychiatric: Atypical affect  Assessment/Plan: 1. Functional deficits secondary to left BKA which require 3+ hours per day of interdisciplinary therapy in a comprehensive inpatient rehab setting.  Physiatrist is providing close team supervision and 24 hour management of active medical problems listed below.  Physiatrist and rehab team continue to assess barriers to discharge/monitor patient progress toward functional and medical goals  Care Tool:  Bathing    Body parts bathed by patient: Right arm, Left arm, Chest, Abdomen, Front perineal area,  Buttocks, Right upper leg, Left upper leg, Right lower leg, Face     Body parts n/a: Left lower leg   Bathing assist Assist Level: Contact Guard/Touching assist     Upper Body Dressing/Undressing Upper body dressing   What is the patient wearing?: Pull over shirt    Upper body assist Assist Level: Set up assist    Lower Body Dressing/Undressing Lower body dressing      What is the patient wearing?: Pants     Lower body assist Assist for lower body dressing: Minimal Assistance - Patient > 75%     Toileting Toileting    Toileting assist Assist for toileting: Contact Guard/Touching assist     Transfers Chair/bed transfer  Transfers assist     Chair/bed transfer assist level: Contact Guard/Touching assist     Locomotion Ambulation   Ambulation assist   Ambulation activity did not occur: Safety/medical concerns  Assist level: Minimal Assistance - Patient > 75% Assistive device: Walker-rolling Max distance: 7131ft   Walk 10 feet activity   Assist  Walk 10 feet activity did not occur: Safety/medical concerns  Assist level: Minimal Assistance - Patient > 75% Assistive device: Walker-rolling   Walk 50 feet activity   Assist Walk 50 feet with 2 turns activity did not occur: Safety/medical concerns         Walk 150 feet activity   Assist Walk 150 feet activity did not occur: Safety/medical concerns         Walk 10 feet on uneven surface  activity   Assist Walk 10 feet on uneven surfaces activity did not occur: Safety/medical concerns  Wheelchair     Assist Will patient use wheelchair at discharge?: Yes Type of Wheelchair: Manual Wheelchair activity did not occur: Safety/medical concerns         Wheelchair 50 feet with 2 turns activity    Assist    Wheelchair 50 feet with 2 turns activity did not occur: Safety/medical concerns       Wheelchair 150 feet activity     Assist Wheelchair 150 feet activity did not  occur: Safety/medical concerns          Medical Problem List and Plan: 1.Decreased functional mobilitysecondary to left BKA 12/04/2018 after failed revascularization procedure  Continue CIR 2. Antithrombotics: -DVT/anticoagulation:Subcutaneous heparin -antiplatelet therapy: N/A 3. Pain Management:Oxycodone as needed -robaxin prn spasms -massage and sensory feedback  Gabapentin 300 3 times daily started on 5/18 for uncontrolled phantom limb pain  Improving on 5/19  Will require significant encouragement 4. Mood:Xanax 0.25-0.5 mg 3 times daily as needed -antipsychotic agents: N/A 5. Neuropsych: This patientiscapable of making decisions on herown behalf. 6. Skin/Wound Care:Routine skin checks  Stump shrinker ordered, pending 7. Fluids/Electrolytes/Nutrition:Routine in and outs   BMP within acceptable range on 5/14, labs ordered for tomorrow  Encourage PO 8.Acute blood loss anemia.   Hb 8.3 on 5/15  Cont to monitor 9.Constipation. Laxative assistance  Improving   LOS: 4 days A FACE TO FACE EVALUATION WAS PERFORMED  Ankit Karis Juba 12/11/2018, 9:20 AM

## 2018-12-12 ENCOUNTER — Inpatient Hospital Stay (HOSPITAL_COMMUNITY): Payer: BLUE CROSS/BLUE SHIELD | Admitting: Physical Therapy

## 2018-12-12 ENCOUNTER — Inpatient Hospital Stay (HOSPITAL_COMMUNITY): Payer: BLUE CROSS/BLUE SHIELD

## 2018-12-12 DIAGNOSIS — I10 Essential (primary) hypertension: Secondary | ICD-10-CM

## 2018-12-12 DIAGNOSIS — R000 Tachycardia, unspecified: Secondary | ICD-10-CM

## 2018-12-12 DIAGNOSIS — G8918 Other acute postprocedural pain: Secondary | ICD-10-CM

## 2018-12-12 MED ORDER — METOPROLOL TARTRATE 12.5 MG HALF TABLET
12.5000 mg | ORAL_TABLET | Freq: Two times a day (BID) | ORAL | Status: DC
  Administered 2018-12-12 – 2018-12-15 (×7): 12.5 mg via ORAL
  Filled 2018-12-12 (×7): qty 1

## 2018-12-12 NOTE — Progress Notes (Signed)
[place] PHYSICAL MEDICINE & REHABILITATION PROGRESS NOTE  Subjective/Complaints: Patient seen sitting up in her chair this morning working with therapies.  She states he slept well overnight.  She still does not have a shrinker, discussed with nursing.  ROS: Denies CP, shortness of breath, nausea, vomiting, diarrhea.  Objective: Vital Signs: Blood pressure (!) 153/85, pulse (!) 101, temperature 98 F (36.7 C), temperature source Oral, resp. rate 18, height 5\' 1"  (1.549 m), weight 82.3 kg, last menstrual period 11/26/2018, SpO2 100 %. No results found. No results for input(s): WBC, HGB, HCT, PLT in the last 72 hours. No results for input(s): NA, K, CL, CO2, GLUCOSE, BUN, CREATININE, CALCIUM in the last 72 hours.  Physical Exam: BP (!) 153/85 (BP Location: Left Arm)   Pulse (!) 101   Temp 98 F (36.7 C) (Oral)   Resp 18   Ht 5\' 1"  (1.549 m)   Wt 82.3 kg   LMP 11/26/2018 (Approximate) Comment: tubal ligation  SpO2 100%   BMI 34.28 kg/m  Constitutional: NAD. Vital signs reviewed. HENT: Normocephalic.  Atraumatic. Eyes: EOMI.  No discharge. Cardiovascular: No JVD. Respiratory: Normal effort. GI: Non-distended. Musc: Left BKA with edema and tenderness with minimal touch, improving.  Able to almost fully extend knee Neurological: Alert Motor:  Right lower extremity: 4+/5 proximal distal Left lower extremity: Hip flexion, knee extension: 4+/5 (pain inhibition) Skin: BKA site with dressing C/D/I  Psychiatric: Normal mood.  Normal behavior.  Assessment/Plan: 1. Functional deficits secondary to left BKA which require 3+ hours per day of interdisciplinary therapy in a comprehensive inpatient rehab setting.  Physiatrist is providing close team supervision and 24 hour management of active medical problems listed below.  Physiatrist and rehab team continue to assess barriers to discharge/monitor patient progress toward functional and medical goals  Care Tool:  Bathing    Body  parts bathed by patient: Right arm, Left arm, Chest, Abdomen, Front perineal area, Buttocks, Right upper leg, Left upper leg, Right lower leg, Face     Body parts n/a: Left lower leg   Bathing assist Assist Level: Contact Guard/Touching assist     Upper Body Dressing/Undressing Upper body dressing   What is the patient wearing?: Pull over shirt    Upper body assist Assist Level: Set up assist    Lower Body Dressing/Undressing Lower body dressing      What is the patient wearing?: Pants     Lower body assist Assist for lower body dressing: Minimal Assistance - Patient > 75%     Toileting Toileting    Toileting assist Assist for toileting: Contact Guard/Touching assist     Transfers Chair/bed transfer  Transfers assist     Chair/bed transfer assist level: Contact Guard/Touching assist     Locomotion Ambulation   Ambulation assist   Ambulation activity did not occur: Safety/medical concerns  Assist level: Minimal Assistance - Patient > 75% Assistive device: Walker-rolling Max distance: 30'   Walk 10 feet activity   Assist  Walk 10 feet activity did not occur: Safety/medical concerns  Assist level: Minimal Assistance - Patient > 75% Assistive device: Walker-rolling   Walk 50 feet activity   Assist Walk 50 feet with 2 turns activity did not occur: Safety/medical concerns         Walk 150 feet activity   Assist Walk 150 feet activity did not occur: Safety/medical concerns         Walk 10 feet on uneven surface  activity   Assist Walk 10 feet  on uneven surfaces activity did not occur: Safety/medical concerns         Wheelchair     Assist Will patient use wheelchair at discharge?: Yes Type of Wheelchair: Manual Wheelchair activity did not occur: Safety/medical concerns  Wheelchair assist level: Supervision/Verbal cueing Max wheelchair distance: 150'    Wheelchair 50 feet with 2 turns activity    Assist    Wheelchair  50 feet with 2 turns activity did not occur: Safety/medical concerns   Assist Level: Supervision/Verbal cueing   Wheelchair 150 feet activity     Assist Wheelchair 150 feet activity did not occur: Safety/medical concerns   Assist Level: Supervision/Verbal cueing      Medical Problem List and Plan: 1.Decreased functional mobilitysecondary to left BKA 12/04/2018 after failed revascularization procedure  Continue CIR  Team conference today to discuss current and goals and coordination of care, home and environmental barriers, and discharge planning with nursing, case manager, and therapies.  2. Antithrombotics: -DVT/anticoagulation:Subcutaneous heparin -antiplatelet therapy: N/A 3. Pain Management:Oxycodone as needed -robaxin prn spasms -massage and sensory feedback  Gabapentin 300 3 times daily started on 5/18 for uncontrolled phantom limb pain  Relatively controlled on 5/20  Will require significant encouragement 4. Mood:Xanax 0.25-0.5 mg 3 times daily as needed -antipsychotic agents: N/A 5. Neuropsych: This patientiscapable of making decisions on herown behalf. 6. Skin/Wound Care:Routine skin checks  Stump shrinker ordered, pending-discussed with nursing 7. Fluids/Electrolytes/Nutrition:Routine in and outs   BMP within acceptable range on 5/14, labs ordered for tomorrow  Encourage PO 8.Acute blood loss anemia.   Hb 8.3 on 5/15, labs ordered for tomorrow  Cont to monitor 9.Constipation. Laxative assistance  Improving 10.  Hypertension/tachycardia  Metoprolol 12.5 twice daily started on 5/20   LOS: 5 days A FACE TO FACE EVALUATION WAS PERFORMED   Debra Ball  12/12/2018, 8:57 AM

## 2018-12-12 NOTE — Progress Notes (Signed)
Occupational Therapy Session Note  Patient Details  Name: Debra Ball MRN: [id number] Date of Birth: [birth year]  Today's Date: 12/12/2018 OT Individual Time: 1400-1425 OT Individual Time Calculation (min): 25 min    Short Term Goals: Week 1:  OT Short Term Goal 1 (Week 1): na STG = LTG  Skilled Therapeutic Interventions/Progress Updates:    Pt resting in w/c upon arrival.  OT intervention with focus on w/c mobility, w/c setup for tranfers, squat pivot transfers, and w/c parts management.  Pt educated on placing/removing leg rest and arm rest to facilitate transfers.  Pt educated on w/c positioning to facilitate transfers.  Pt practiced setup and transfers X 3 with supervision and min verbal cues. Pt returned to room and remained in w/c with all needs within reach and belt alarm activated.   Therapy Documentation Precautions:  Precautions Precautions: Fall Precaution Comments: new L BKA Restrictions Weight Bearing Restrictions: Yes LLE Weight Bearing: Non weight bearing Pain:  Pt c/o phantom burning sensation; education on desensitizing   Therapy/Group: Individual Therapy  Debra Ball 12/12/2018, 2:38 PM

## 2018-12-12 NOTE — Care Management (Signed)
[facility]patient Rehabilitation [facility] Individual Statement of Services  Patient Name:  Debra Ball  Date:  12/12/2018  Welcome to the Inpatient Rehabilitation Center.  Our goal is to provide you with an individualized program based on your diagnosis and situation, designed to meet your specific needs.  With this comprehensive rehabilitation program, you will be expected to participate in at least 3 hours of rehabilitation therapies Monday-Friday, with modified therapy programming on the weekends.  Your rehabilitation program will include the following services:  Physical Therapy (PT), Occupational Therapy (OT), 24 hour per day rehabilitation nursing, Therapeutic Recreaction (TR), Neuropsychology, Case Management (Social Worker), Rehabilitation Medicine, Nutrition Services and Pharmacy Services  Weekly team conferences will be held on Wednesdays to discuss your progress.  Your Social Worker will talk with you frequently to get your input and to update you on team discussions.  Team conferences with you and your family in attendance may also be held.  Expected length of stay: 7-10 days   Overall anticipated outcome: modified independent at wheelchair  Depending on your progress and recovery, your program may change. Your Social Worker will coordinate services and will keep you informed of any changes. Your Social Worker's name and contact numbers are listed  below.  The following services may also be recommended but are not provided by the Inpatient Rehabilitation Center:   Driving Evaluations  Home Health Rehabiltiation Services  Outpatient Rehabilitation Services  Vocational Rehabilitation   Arrangements will be made to provide these services after discharge if needed.  Arrangements include referral to agencies that provide these services.  Your insurance has been verified to be:  [employer] of [place] Your primary doctor is:  Kyla Balzarine, [place]  Pertinent information will be shared with your  doctor and your insurance company.  Social Worker:  [place], [place] [phone] or ([phone]   Information discussed with and copy given to patient by: Amada Jupiter, 12/12/2018, 9:35 AM

## 2018-12-12 NOTE — Progress Notes (Signed)
  Patient ID: Debra Ball, female   DOB: [birth year], 43 y.o.   MRN: [id number]    Diagnosis codes:  Z47.81  Height:      5'1"          Weight:     180 lbs       Patient suffers from a left BKA which impairs their ability to perform daily activities like toileting, dressing and mobility in the home.  A walker will not resolve issue with performing activities of daily living.  A wheelchair will allow patient to safely perform daily activities.  Patient is not able to propel themselves in the home using a standard weight wheelchair due to general weakness .  Patient can self propel in the lightweight wheelchair.  Mariam Dollar, PA-C

## 2018-12-12 NOTE — Progress Notes (Addendum)
Physical Therapy Session Note  Patient Details  Name: Sweta Maida MRN: [id number] Date of Birth: 12/12/1975  Today's Date: 12/12/2018 PT Individual Time: 1000-1055 PT Individual Time Calculation (min): 55 min   Short Term Goals: Week 1:  PT Short Term Goal 1 (Week 1): =LTG due to ELOS  Skilled Therapeutic Interventions/Progress Updates:   Pt in supine and agreeable to therapy, no c/o pain. Supine>sit w/ supervision and stand pivot transfer to w/c w/ close supervision using RW. Pt self-propelled w/c to/from therapy gym and around unit w/ supervision using BUEs to work on endurance and w/c management. Negotiated 6 cones both forwards and backwards w/ supervision and increased time. Practiced w/c parts management throughout session, needed verbal cues for safety and how to manage leg rests, otherwise pt performed tasks w/o physical assist. Practiced multiple squat pivot transfers to/from mat w/ CGA. Pt at first standing and spinning in 270 deg circle to sit down on mat vs 90 degree pivot turn. Needed max verbal and visual cues to adjust technique, once pt understood she was able to perform multiple reps w/ CGA. Performed arm ergometer 5 min forward and 5 min backwards to work on UE endurance and strengthening. Returned to room and ended session in supine, all needs in reach.   Therapy Documentation Precautions:  Precautions Precautions: Fall Precaution Comments: new L BKA Restrictions Weight Bearing Restrictions: Yes LLE Weight Bearing: Non weight bearing  Therapy/Group: Individual Therapy  Amman Bartel Melton Krebs 12/12/2018, 12:36 PM

## 2018-12-12 NOTE — Progress Notes (Signed)
Occupational Therapy Session Note  Patient Details  Name: Debra Ball MRN: [id number] Date of Birth: 1976/02/17  Today's Date: 12/12/2018 OT Individual Time: 3785-8850 OT Individual Time Calculation (min): 57 min    Short Term Goals: Week 1:  OT Short Term Goal 1 (Week 1): na STG = LTG  Skilled Therapeutic Interventions/Progress Updates:    1:1. Pt received in bed with 7/10 pain. RN alerted to deliver pain medication. Pt completes squat pivot transfers with CGA fading to supervision with grab bar EOB>w/c<>shower chair with VC for w/c parts management. Pt completes bathing seated in shower chair with supervision and occlusive applied to L LE. Pt dons night gown with set up and pants with CGA sit to stand at sink. Pt completes oral care seated in w/c with MOD I. Pt given w/c gloves d/t c/o pain/tingling in hands during w/c mobility and propels w/c in hallway with no improvement. Exited session with pt seated in w/c, belt alamr on and call lightin reach  Therapy Documentation Precautions:  Precautions Precautions: Fall Precaution Comments: new L BKA Restrictions Weight Bearing Restrictions: Yes LLE Weight Bearing: Non weight bearing General:   Vital Signs: Therapy Vitals Temp: 98 F (36.7 C) Temp Source: Oral Pulse Rate: (!) 101 Resp: 18 BP: (!) 153/85 Patient Position (if appropriate): Lying Oxygen Therapy SpO2: 100 % O2 Device: Room Air Pain: Pain Assessment Pain Score: 0-No pain ADL: ADL Eating: Independent Where Assessed-Eating: Bed level Grooming: Independent Where Assessed-Grooming: Sitting at sink, Wheelchair Upper Body Bathing: Setup Where Assessed-Upper Body Bathing: Sitting at sink, Wheelchair Lower Body Bathing: Contact guard Where Assessed-Lower Body Bathing: Standing at sink, Sitting at sink, Wheelchair Upper Body Dressing: Setup Where Assessed-Upper Body Dressing: Sitting at sink, Wheelchair Lower Body Dressing: Minimal assistance Where  Assessed-Lower Body Dressing: Sitting at sink, Engineer, petroleum    Praxis   Exercises:   Other Treatments:     Therapy/Group: Individual Therapy  Shon Hale 12/12/2018, 7:18 AM

## 2018-12-13 ENCOUNTER — Inpatient Hospital Stay (HOSPITAL_COMMUNITY): Payer: BLUE CROSS/BLUE SHIELD | Admitting: Occupational Therapy

## 2018-12-13 ENCOUNTER — Inpatient Hospital Stay (HOSPITAL_COMMUNITY): Payer: BLUE CROSS/BLUE SHIELD | Admitting: Physical Therapy

## 2018-12-13 DIAGNOSIS — W19XXXA Unspecified fall, initial encounter: Secondary | ICD-10-CM

## 2018-12-13 LAB — BASIC METABOLIC PANEL
Anion gap: 9 (ref 5–15)
BUN: 9 mg/dL (ref 6–20)
CO2: 25 mmol/L (ref 22–32)
Calcium: 9.2 mg/dL (ref 8.9–10.3)
Chloride: 104 mmol/L (ref 98–111)
Creatinine, Ser: 0.74 mg/dL (ref 0.44–1.00)
GFR calc Af Amer: >60 mL/min (ref >60)
GFR calc non Af Amer: >60 mL/min (ref >60)
Glucose, Bld: 137 mg/dL — ABNORMAL HIGH (ref 70–99)
Potassium: 4.2 mmol/L (ref 3.5–5.1)
Sodium: 138 mmol/L (ref 135–145)

## 2018-12-13 LAB — CBC WITH DIFFERENTIAL/PLATELET
Abs Immature Granulocytes: 0.1 10*3/uL — ABNORMAL HIGH (ref 0.00–0.07)
Basophils Absolute: 0 10*3/uL (ref 0.0–0.1)
Basophils Relative: 0 %
Eosinophils Absolute: 0.2 10*3/uL (ref 0.0–0.5)
Eosinophils Relative: 2 %
HCT: 28.5 % — ABNORMAL LOW (ref 36.0–46.0)
Hemoglobin: 8.7 g/dL — ABNORMAL LOW (ref 12.0–15.0)
Immature Granulocytes: 1 %
Lymphocytes Relative: 23 %
Lymphs Abs: 1.8 10*3/uL (ref 0.7–4.0)
MCH: 25.9 pg — ABNORMAL LOW (ref 26.0–34.0)
MCHC: 30.5 g/dL (ref 30.0–36.0)
MCV: 84.8 fL (ref 80.0–100.0)
Monocytes Absolute: 0.5 10*3/uL (ref 0.1–1.0)
Monocytes Relative: 7 %
Neutro Abs: 5.1 10*3/uL (ref 1.7–7.7)
Neutrophils Relative %: 67 %
Platelets: 247 10*3/uL (ref 150–400)
RBC: 3.36 MIL/uL — ABNORMAL LOW (ref 3.87–5.11)
RDW: 16.5 % — ABNORMAL HIGH (ref 11.5–15.5)
WBC: 7.6 10*3/uL (ref 4.0–10.5)
nRBC: 0 % (ref 0.0–0.2)

## 2018-12-13 NOTE — Progress Notes (Signed)
Social Work Patient ID: Debra Ball, female   DOB: 1975/09/18, 43 y.o.   MRN: [id number]   Have reviewed team conference with patient and spouse.  Both aware and agreeable with targeted discharge date of 523 and team goals of modified independent level assistance overall.  Therapies have discussed need for ramp and spouse is coordinating this to be built but does not expect it to be done by discharge.  Therapies have reviewed home entry that can be done on a short-term basis until ramp has been built.  Continue to follow.  Yann Biehn, LCSW

## 2018-12-13 NOTE — Progress Notes (Signed)
Scheduled heparin held this morning per Gerhard Perches.

## 2018-12-13 NOTE — Plan of Care (Signed)
  Problem: RH BOWEL ELIMINATION Goal: RH STG MANAGE BOWEL W/MEDICATION W/ASSISTANCE Description STG Manage Bowel with Medication Min with Assistance.  Outcome: Progressing   Problem: RH SKIN INTEGRITY Goal: RH STG SKIN FREE OF INFECTION/BREAKDOWN Description Monitor incision and for skin breakdown during admission  Outcome: Progressing

## 2018-12-13 NOTE — Patient Care Conference (Signed)
Inpatient RehabilitationTeam Conference and Plan of Care Update Date: 12/12/2018   Time: 2:55 PM    Patient Name: Debra Ball      Medical Record Number: [id number]  Date of Birth: 04/01/1976 Sex: Female         Room/Bed: 4M05C/4M05C-01 Payor Info: Payor: BLUE CROSS BLUE SHIELD / Plan: BCBS OTHER / Product Type: *No Product type* /    Admitting Diagnosis: ABI Team  [employer] BKA; 10-13days  Admit Date/Time:  12/07/2018  5:58 PM Admission Comments: No comment available   Primary Diagnosis:  <principal problem not specified> Principal Problem: <principal problem not specified>  Patient Active Problem List   Diagnosis Date Noted  . Fall   . Reactive hypertension   . Sinus tachycardia   . Postoperative pain   . Therapeutic opioid induced constipation   . Acute blood loss anemia   . Phantom limb pain (HCC)   . Left below-knee amputee (HCC) 12/07/2018  . Ischemia of left lower extremity 12/02/2018  . Critical lower limb ischemia 12/02/2018  . 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  Improve mobility, HTN/Tachy, wounds care, pain  See above   Bowel/Bladder   Continent of bowel and bladder LBM-5/18  Maintain control and normal pattern of bowel and bladder,   Assist with toileting needs   Swallow/Nutrition/ Hydration             ADL's   Min A overall  Mod I/supervision  Self-care retraining, activity tolerance, general strengtening,  sit<>stand, transfers, residual limb care   Mobility   CGA-close supervision overall for transfers and gait w/ RW, needs frequent cues for safety   mod/i  discharge planning, endurance, functional balance, transfers, residual limb edu   Communication             Safety/Cognition/ Behavioral Observations            Pain   Frequent c/o pain to surgical site to left lower leg S/P L BKA prn percocet, robaxin and scheduled gabapentin for pain management  Pain <2/10  Assess pain every shift and as needed   Skin   Staples intact to surgical site to left lower extremity   no s/sx of infection  Assess skin and treat surgical area as ordered    Rehab Goals Patient on target to meet rehab goals: Yes *See Care Plan and progress notes for long and short-term goals.     Barriers to Discharge  Current Status/Progress Possible Resolutions Date Resolved   Physician    Medical stability;Wound Care;Weight bearing restrictions     See above  Therapies, optimize BP/tachy/pain meds      Nursing                  PT  Inaccessible home environment;Medical stability;Home environment access/layout;Behavior  stairs to enter home, husband getting ramp built               OT  SLP                SW Inaccessible home environment Pt and spouse aware of need for ramp and he is getting this coordinated.            Discharge Planning/Teaching Needs:  Pt to return home with spouse and teen-aged children.  41 yo daughter is also local and can provide additional support.    Teaching to be planned closer to d/c date.   Team Discussion:  Pain better controlled.  Stump shrinker needed and on order.  A and O x4.  Continent b/b.  Contact-guard to close supervision overall with therapies.  On track for modified independent goals with PT.  May need more assistance with goals.  Decreased safety awareness.  Review of DME and follow-up needs.  Revisions to Treatment Plan:  N/A    Continued Need for  Acute Rehabilitation Level of Care: The patient requires daily medical management by a physician with specialized training in physical medicine and rehabilitation for the following conditions: Daily direction of a multidisciplinary physical rehabilitation program to ensure safe treatment while eliciting the highest outcome that is of practical value to the patient.: Yes Daily medical management of patient stability for increased activity during participation in an intensive rehabilitation regime.: Yes Daily analysis of laboratory values and/or radiology reports with any subsequent need for medication adjustment of medical intervention for : Post surgical problems;Blood pressure problems;Other   I attest that I was present, lead the team conference, and concur with the assessment and plan of the team.   Amada JupiterHOYLE, Greer Koeppen 12/13/2018, 4:33 PM    Team conference was held via web/ teleconference due to COVID - 19

## 2018-12-13 NOTE — Progress Notes (Signed)
Physical Therapy Discharge Summary  Patient Details  Name: Debra Ball MRN: [id number] Date of Birth: [birth year]  Patient has met 4 of 6 long term goals due to improved activity tolerance, improved balance, increased strength, increased range of motion, decreased pain and ability to compensate for deficits.  Patient to discharge at a wheelchair level Modified Independent, supervision w/ short distance gait using RW.  Patient's care partner is independent to provide the necessary physical assistance at discharge. Pt's husband and teenage children planning to assist pt and pt is able to direct her care.   Reasons goals not met: Car transfer and gait continue to require distant supervision for safety 2/2 functional balance deficits.   Recommendation:  Patient will benefit from ongoing skilled PT services in home health setting to continue to advance safe functional mobility, address ongoing impairments in functional strength, balance, endurance, and LLE ROM, and minimize fall risk.  Equipment: manual w/c, RW  Reasons for discharge: treatment goals met and discharge from hospital  Patient/family agrees with progress made and goals achieved: Yes  PT Discharge Precautions/Restrictions Precautions Precautions: Fall Precaution Comments: new L BKA Restrictions Weight Bearing Restrictions: Yes LLE Weight Bearing: Non weight bearing Pain Pain Assessment Pain Scale: 0-10 Pain Score: 8  Pain Type: Acute pain Pain Location: Leg Pain Orientation: Left Pain Descriptors / Indicators: Aching;Discomfort;Dull Pain Frequency: Constant Pain Onset: On-going Pain Intervention(s): Medication (See eMAR)   Mobility Bed Mobility Rolling Right: Independent Rolling Left: Independent Supine to Sit: Independent Sit to Supine: Independent Transfers Sit to Stand: Independent with assistive device Stand to Sit: Independent with assistive device Stand Pivot Transfers: Independent with  assistive device Locomotion  Gait Gait Assistance: Supervision/Verbal cueing Gait Distance (Feet): 10 Feet Assistive device: Rolling walker Stairs / Additional Locomotion Stairs Assistance: Contact Guard/Touching assist Stair Management Technique: Seated/boosting Number of Stairs: 4 Ramp: Independent with assistive Production manager Assistance: Independent with assistive device Wheelchair Propulsion: Both upper extremities Wheelchair Parts Management: Supervision/cueing Distance: Mod I with mobility and ramp with wheelchair   Willow Ora 12/14/2018, 5:46 PM   Burnard Bunting, PT, DPT 12/14/2018, 8:30 PM

## 2018-12-13 NOTE — Discharge Summary (Signed)
Physician Discharge Summary  Patient ID: Debra Ball MRN: [id number] DOB/AGE: January 19, 1976 43 y.o.  Admit date: 12/07/2018 Discharge date: 12/15/2018  Discharge Diagnoses:  Active Problems:   Left below-knee amputee (HCC)   Acute blood loss anemia   Phantom limb pain (HCC)   Therapeutic opioid induced constipation   Reactive hypertension   Sinus tachycardia   Postoperative pain   Fall   Discharged Condition: Stable  Significant Diagnostic Studies: No results found.  Labs:  Basic Metabolic Panel: Recent Labs  Lab 12/07/18 1817 12/13/18 0446  NA  --  138  K  --  4.2  CL  --  104  CO2  --  25  GLUCOSE  --  137*  BUN  --  9  CREATININE 0.74 0.74  CALCIUM  --  9.2    CBC: Recent Labs  Lab 12/07/18 1817 12/13/18 0446  WBC 6.7 7.6  NEUTROABS  --  5.1  HGB 8.3* 8.7*  HCT 26.1* 28.5*  MCV 83.4 84.8  PLT 222 247    CBG: No results for input(s): GLUCAP in the last 168 hours.  Family history.  Noted mother and father with hypertension.  Grandparents with diabetes mellitus.  Denies any cancer  Brief HPI:    Debra Ball is a 43 year old right-handed female with a long history of peripheral vascular disease left popliteal artery occlusion with thrombectomy 11/07/2018.  She was discharged home on Eliquis.  Per chart review lives with spouse and her children.  She had been working in a Associate Professor.  Presented 12/01/2018 to [facility] with increasing pain to her left foot.  She no longer had palpable pulses and was transferred to [facility].  Underwent redo left below-knee popliteal artery exposure thrombectomy of left superficial femoral-popliteal anterior tibial and posterior tibial artery 12/01/2018 per Dr. Myra Gianotti.  Patient with progressive ongoing ischemic changes and limb was not felt to be salvageable.  Underwent left BKA 12/04/2018 per Dr. Edilia Bo.  Maintained on subcutaneous heparin for DVT prophylaxis.  Acute blood loss anemia 6.9  transfused.  Therapy evaluations completed and patient was admitted for a comprehensive rehab program  Hospital Course: Debra Ball was admitted to rehab 12/07/2018 for inpatient therapies to consist of PT, ST and OT at least three hours five days a week. Past admission physiatrist, therapy team and rehab RN have worked together to provide customized collaborative inpatient rehab.  Pertaining to patient left BKA after failed revascularization.  She would follow-up with vascular surgery.  Patient did have a fall 12/12/2018 and had some bleeding from her stump that resolved with pressure dressing.  Initially on subcutaneous heparin for DVT prophylaxis later discontinued.  Pain management with the use of Neurontin scheduled as well as oxycodone and Robaxin as needed.  Mood stabilization with Xanax.  Acute blood loss anemia latest hemoglobin 8.7.  Blood pressure control with low-dose Lopressor.  She would follow-up with her primary MD  Physical exam.  Blood pressure 125/55 pulse 92 temperature 99.6 respirations 14 oxygen saturations 98% room air Constitutional.  Well-developed well-nourished HEENT Head.  Normocephalic and atraumatic Eyes.  Pupils round and reactive to light without discharge no nystagmus Neck.  Supple nontender no thyromegaly without JVD Cardiovascular normal rate and rhythm without murmur or rub GI soft exhibits no distention nontender without rebound Neurological alert and oriented no cranial nerve deficits upper extremities 5 out of 5.  Right lower extremity 4 out of 5 proximal to distal BKA site with staples intact.  Rehab course:  During patient's stay in rehab weekly team conferences were held to monitor patient's progress, set goals and discuss barriers to discharge. At admission, patient required moderate assist sit to stand, moderate assist stand pivot transfers.  Moderate assist upper body bathing minimal guard lower body bathing minimal guard upper body dressing  moderate assist lower body dressing and moderate assist toilet transfers  She  has had improvement in activity tolerance, balance, postural control as well as ability to compensate for deficits. She has had improvement in functional use RUE/LUE  and RLE/LLE as well as improvement in awareness.  Sessions focused on energy conservation.  Family was constructing a ramp.  She was able to do it to people wheelchair bumped up and bumped down stairs.  Able to successfully bump but up and down stairs.  Patient transferred him to wheelchair with minimal guard for stand pivot transfers with the use of a rolling walker.  Patient set up wheelchair for transfers onto toilet with the use of grab bar.  Patient perform clothing management and hygiene with minimal guard for balance overall.  Supine to sit with supervision stand pivot transfers to wheelchair with close supervision using rolling walker.  Family teaching completed plan discharge to home       Disposition: Discharge disposition: 01-Home or Self Care     Discharge to home   Diet: Regular  Special Instructions: No smoking driving or alcohol  Medications at discharge 1.  Tylenol as needed 2.  Xanax 0.25 to 0.5 mg 3 times daily as needed 3.  Colace 100 mg p.o. daily 4.  Neurontin 300 mg p.o. 3 times daily 5.  Robaxin 500 mg p.o. every 8 hours as needed muscle spasms 6.  Lopressor 12.5 mg p.o. twice daily 7.  Oxycodone 1 to 2 tablets p.o. every 4 hours as needed pain 8.  Protonix 40 mg p.o. daily  Discharge Instructions    Ambulatory referral to Physical Medicine Rehab   Complete by:  As directed    Moderate complexity follow-up 1 to 2 weeks left BKA      Follow-up Information    Marcello FennelPatel, Ankit Anil, MD Follow up.   Specialty:  Physical Medicine and Rehabilitation Why:  Office to call for appointment Contact information: [address] [place] [address] [phone]        Chuck Hintickson, Christopher S, MD Follow up.    Specialties:  Vascular Surgery, Cardiology Why:  Call for appointment Contact information: [address] [place] [address] [phone]           Signed: Charlton AmorDaniel J Sair Faulcon 12/14/2018, 5:33 AM

## 2018-12-13 NOTE — Progress Notes (Signed)
[facility] PHYSICAL MEDICINE & REHABILITATION PROGRESS NOTE  Subjective/Complaints: Patient seen laying in bed this morning.  She had a fall overnight.  Discussed with nursing and PA.  Patient states that she is not sure what happened.  Discussed dressing changes with nursing as well as holding anticoagulation.  Noted to have bleeding from stump.  ROS: Denies CP, shortness of breath, nausea, vomiting, diarrhea.  Objective: Vital Signs: Blood pressure (!) 158/91, pulse 86, temperature 98.5 F (36.9 C), temperature source Oral, resp. rate (!) 22, height 5\' 1"  (1.549 m), weight 82.3 kg, last menstrual period 11/26/2018, SpO2 100 %. No results found. Recent Labs    12/13/18 0446  WBC 7.6  HGB 8.7*  HCT 28.5*  PLT 247   Recent Labs    12/13/18 0446  NA 138  K 4.2  CL 104  CO2 25  GLUCOSE 137*  BUN 9  CREATININE 0.74  CALCIUM 9.2    Physical Exam: BP (!) 158/91 (BP Location: Left Arm)   Pulse 86   Temp 98.5 F (36.9 C) (Oral)   Resp (!) 22   Ht 5\' 1"  (1.549 m)   Wt 82.3 kg   LMP 11/26/2018 (Approximate) Comment: tubal ligation  SpO2 100%   BMI 34.28 kg/m  Constitutional: NAD. Vital signs reviewed. HENT: Normocephalic.  Atraumatic. Eyes: EOMI.  No discharge. Cardiovascular: No JVD. Respiratory: Normal effort. GI: Non-distended. Musc: Left BKA with edema and tenderness with minimal touch, increased due to fall.  Able to almost fully extend knee Neurological: Alert Motor:  Right lower extremity: 4+/5 proximal distal, stable Left lower extremity: Hip flexion, knee extension: 4+/5 (pain inhibition), stable Skin: BKA site with left para median site with sanguinous drainage between staples. Psychiatric: Normal mood.  Normal behavior.  Assessment/Plan: 1. Functional deficits secondary to left BKA which require 3+ hours per day of interdisciplinary therapy in a comprehensive inpatient rehab setting.  Physiatrist is providing close team supervision and 24 hour  management of active medical problems listed below.  Physiatrist and rehab team continue to assess barriers to discharge/monitor patient progress toward functional and medical goals  Care Tool:  Bathing    Body parts bathed by patient: Right arm, Left arm, Chest, Abdomen, Front perineal area, Buttocks, Right upper leg, Left upper leg, Right lower leg, Face     Body parts n/a: Left lower leg   Bathing assist Assist Level: Contact Guard/Touching assist     Upper Body Dressing/Undressing Upper body dressing   What is the patient wearing?: Pull over shirt    Upper body assist Assist Level: Set up assist    Lower Body Dressing/Undressing Lower body dressing      What is the patient wearing?: Pants     Lower body assist Assist for lower body dressing: Minimal Assistance - Patient > 75%     Toileting Toileting    Toileting assist Assist for toileting: Contact Guard/Touching assist     Transfers Chair/bed transfer  Transfers assist     Chair/bed transfer assist level: Contact Guard/Touching assist     Locomotion Ambulation   Ambulation assist   Ambulation activity did not occur: Safety/medical concerns  Assist level: Minimal Assistance - Patient > 75% Assistive device: Walker-rolling Max distance: 30'   Walk 10 feet activity   Assist  Walk 10 feet activity did not occur: Safety/medical concerns  Assist level: Minimal Assistance - Patient > 75% Assistive device: Walker-rolling   Walk 50 feet activity   Assist Walk 50 feet with 2 turns  activity did not occur: Safety/medical concerns         Walk 150 feet activity   Assist Walk 150 feet activity did not occur: Safety/medical concerns         Walk 10 feet on uneven surface  activity   Assist Walk 10 feet on uneven surfaces activity did not occur: Safety/medical concerns         Wheelchair     Assist Will patient use wheelchair at discharge?: Yes Type of Wheelchair:  Manual Wheelchair activity did not occur: Safety/medical concerns  Wheelchair assist level: Supervision/Verbal cueing Max wheelchair distance: 150'    Wheelchair 50 feet with 2 turns activity    Assist    Wheelchair 50 feet with 2 turns activity did not occur: Safety/medical concerns   Assist Level: Supervision/Verbal cueing   Wheelchair 150 feet activity     Assist Wheelchair 150 feet activity did not occur: Safety/medical concerns   Assist Level: Supervision/Verbal cueing      Medical Problem List and Plan: 1.Decreased functional mobilitysecondary to left BKA 12/04/2018 after failed revascularization procedure  Continue CIR  Fall overnight on stump, increased bleeding, will need to monitor closely. 2. Antithrombotics: -DVT/anticoagulation:Subcutaneous heparin on hold for today -antiplatelet therapy: N/A 3. Pain Management:Oxycodone as needed -robaxin prn spasms -massage and sensory feedback  Gabapentin 300 3 times daily started on 5/18 for uncontrolled phantom limb pain  Relatively controlled on 5/20  Will require significant encouragement 4. Mood:Xanax 0.25-0.5 mg 3 times daily as needed -antipsychotic agents: N/A 5. Neuropsych: This patientiscapable of making decisions on herown behalf. 6. Skin/Wound Care:Routine skin checks  Stump shrinker ordered, pending-discussed with nursing 7. Fluids/Electrolytes/Nutrition:Routine in and outs   BMP within acceptable range on 5/21  Encourage PO 8.Acute blood loss anemia.   Hb 8.7 on 5/21  Cont to monitor 9.Constipation. Laxative assistance  Improving 10.  Hypertension/tachycardia  Metoprolol 12.5 twice daily started on 5/20  Further elevated today, likely from stress of fall, continue to monitor   LOS: 6 days A FACE TO FACE EVALUATION WAS PERFORMED  Debra Ball Debra Ball 12/13/2018, 9:47 AM

## 2018-12-13 NOTE — Significant Event (Addendum)
Was the fall witnessed: no, staff present when patient fell, however unable to give specific details regarding fall  Patient condition before and after the fall: per staff report, pt sitting in wheelchair, stable condition prior to fall. Post fall-patient noted sitting upright on the floor, near the doorway of her room with staff present.   Pt able to move all extremities within normal limits for patient, denies hitting head, bleeding noted from left lateral stump, staples remain intact.   Patient's reaction to the fall: Pt noted to be upset, crying, increasingly anxious after falling-pt noted with history of high anxiety with crying spells.Reports pain to left stump.   Name of the doctor that was notified including date and time: Jesusita Oka Angiulli-PA notified at 2. Pt requested that she be the one to notify her husband. Pt notified husband at 17 with this nurse present. Pt instructed her husband to call nurses station to followup with this nurse if he has any further questions.   Any interventions and vital signs: Assessment performed, vital signs obtained, site to stump cleansed and pressure dressing applied. T-97.9-Hr-107, Resps-22, BP-159/93 O2 sat 100% on room air.

## 2018-12-13 NOTE — Progress Notes (Signed)
Occupational Therapy Session Note  Patient Details  Name: Debra Ball MRN: [id number] Date of Birth: 10/05/1975  Today's Date: 12/13/2018 OT Individual Time: 1433-1533 OT Individual Time Calculation (min): 60 min   Short Term Goals: Week 1:  OT Short Term Goal 1 (Week 1): na STG = LTG  Skilled Therapeutic Interventions/Progress Updates:    Pt greeted semi-reclined in bed and agreeable to OT treatment session. Pt recalled her fall last night. OT discussed safety with pt. Pt completed stand-pivot to wc on L side, but completed it turning all the way to the R instead of a short turn to the L. Educated pt on technique and completed blocked practice of squat-pivot transfers to and from wc. Educated on wc mechanics including lifting up arm rest prior to transfer. Discussed that the technique she used the first transfer is probably why she fell as she needed min A. Pt propelled wc into bathroom with verbal cues for wc positioning at shower chair. Stand-pivot into shower with close supervision. Bathing completed with supervision overall. Worked on dressing sit<>stand from wc with improved safety awareness as pt locked wc today prior to standing without OT reminding her. Pt propelled wc to therapy gym and completed more squat-pivot transfers to and from therapy mat. Supervision for transfers and eventually able to get without cues for technique. Pt returned to room at end of session and left seated in wc with needs met and alarm belt on.   Therapy Documentation Precautions:  Precautions Precautions: Fall Precaution Comments: new L BKA Restrictions Weight Bearing Restrictions: Yes LLE Weight Bearing: Non weight bearing Pain: Pain Assessment Pain Scale: 0-10 Pain Score: 3  Pain Type: Acute pain Pain Location: Leg Pain Orientation: Left Pain Descriptors / Indicators: Aching;Discomfort;Dull Pain Frequency: Constant Pain Onset: On-going Pain Intervention(s): Repositioned   Other  Treatments: Treatments Therapeutic Activity: Performed education regarding phantom limb pain - cause of pain and treatment options for pain (mirror therapy and desensitization); also educated pt on symptoms of infection and importance of seeking medical attention quickly.   Therapy/Group: Individual Therapy  Elisabeth S Doe 12/13/2018, 2:58 PM  

## 2018-12-13 NOTE — Progress Notes (Signed)
Occupational Therapy Session Note  Patient Details  Name: Debra Ball MRN: [id number] Date of Birth: [birth year]  Today's Date: 12/13/2018 OT Individual Time: 7106-2694 OT Individual Time Calculation (min): 45 min    Short Term Goals: Week 1:  OT Short Term Goal 1 (Week 1): na STG = LTG   Skilled Therapeutic Interventions/Progress Updates:    Upon entering the room, pt supine in bed with no c/o pain and agreeable to OT intervention. Pt very tearful this session secondary to fall last night and pt's concern that incident will keep her in hospital longer. Pt reports being unclear as to why/how she fell. Pt transferred into wheelchair with min guard for stand pivot transfer with use of RW. Pt able to place amputee pad and leg rest with increased time. Pt set up wheelchair for transfer onto toilet with use of grab bar. Min cuing for safety awareness as pt attempting to stand onto leg rest to transfer. Pt performed clothing management and hygiene with min guard for balance overall. Pt returning to wheelchair and propelling to sink for grooming tasks at supervision level. Chair alarm belt donned with call bell and all needed items within reach.   Therapy Documentation Precautions:  Precautions Precautions: Fall Precaution Comments: new L BKA Restrictions Weight Bearing Restrictions: Yes LLE Weight Bearing: Non weight bearing Pain: Pain Assessment Pain Scale: 0-10 Pain Score: 1  Pain Type: Acute pain;Phantom pain Pain Location: Leg Pain Orientation: Left Pain Descriptors / Indicators: Tightness Pain Onset: On-going ADL: ADL Eating: Independent Where Assessed-Eating: Bed level Grooming: Independent Where Assessed-Grooming: Sitting at sink, Wheelchair Upper Body Bathing: Setup Where Assessed-Upper Body Bathing: Sitting at sink, Wheelchair Lower Body Bathing: Contact guard Where Assessed-Lower Body Bathing: Standing at sink, Sitting at sink, Wheelchair Upper Body Dressing:  Setup Where Assessed-Upper Body Dressing: Sitting at sink, Wheelchair Lower Body Dressing: Minimal assistance Where Assessed-Lower Body Dressing: Sitting at sink, Wheelchair   Therapy/Group: Individual Therapy  Alen Bleacher 12/13/2018, 12:08 PM

## 2018-12-13 NOTE — Progress Notes (Signed)
Physical Therapy Session Note  Patient Details  Name: Debra Ball MRN: [id number] Date of Birth: Nov 26, 1975  Today's Date: 12/13/2018 PT Individual Time: 2505-3976 PT Individual Time Calculation (min): 60 min   Short Term Goals: Week 1:  PT Short Term Goal 1 (Week 1): =LTG due to ELOS  Skilled Therapeutic Interventions/Progress Updates:    Treatment session focused on review of various options for home entry if ramp not constructed by D/C.  Reviewed with patient options including: two people w/c bump up/down stairs, ambulance, bumping butt up stairs or hopping laterally up/down stairs with one rail.   Pt able to successfully bump butt up/down stairs but unable to hop with one rail.  Demonstrated to pt how to bump w/c backwards up stairs without pt in chair.  Will need handout for family. Pt tolerated well.    Therapy Documentation Precautions:  Precautions Precautions: Fall Precaution Comments: new L BKA Restrictions Weight Bearing Restrictions: Yes LLE Weight Bearing: Non weight bearing Pain: Pain Assessment Pain Scale: 0-10 Pain Score: 1  Pain Type: Acute pain;Phantom pain Pain Location: Leg Pain Orientation: Left Pain Descriptors / Indicators: Tightness Pain Onset: On-going Mobility: Transfers Transfers: Sit to Stand;Stand to Sit;Stand Pivot Transfers Sit to Stand: Supervision/Verbal cueing;Moderate Assistance - Patient 50-74% Stand to Sit: Supervision/Verbal cueing Stand Pivot Transfers: Supervision/Verbal cueing;Moderate Assistance - Patient 50 - 74% Stand Pivot Transfer Details (indicate cue type and reason): With RW; cues to fully lock brakes before standing.  Performed sit > stand from w/c with supervision.  Lower surfaces: from toilet after toileting with grab bar and from stairs when practicing stairs pt required min-mod A to safely stand Transfer (Assistive device): Rolling walker Locomotion : Gait Ambulation: Yes Gait Assistance: Minimal Assistance -  Patient > 75% Gait Distance (Feet): 30 Feet Assistive device: Rolling walker Gait Assistance Details: Performed gait in room to/from bathroom with pt managing doors and negotiating around obstacles.  Also performed gait up/down ramp with RW with min A; no verbal cues required for safety or sequencing. Stairs / Additional Locomotion Stairs: Yes Stairs Assistance: Moderate Assistance - Patient 50 - 74% Stair Management Technique: One rail Left;Seated/boosting Number of Stairs: 4 Height of Stairs: 6 Ramp: Minimal Assistance - Patient >75%(with w/c and RW) Wheelchair Mobility Wheelchair Mobility: Yes Wheelchair Assistance: Minimal assistance - Patient >75% Wheelchair Propulsion: Both upper extremities Wheelchair Parts Management: Supervision/cueing Distance: pt is MOD I for w/c mobility on level surfaces, min A when negotiating ramp  Balance: Balance Balance Assessed: Yes Dynamic Standing Balance Dynamic Standing - Balance Support: Right upper extremity supported;Left upper extremity supported;During functional activity Dynamic Standing - Level of Assistance: 4: Min assist Dynamic Standing - Comments: min A for balance when managing clothing for toileting Other Treatments: Treatments Therapeutic Activity: Performed education regarding phantom limb pain - cause of pain and treatment options for pain (mirror therapy and desensitization); also educated pt on symptoms of infection and importance of seeking medical attention quickly.    Therapy/Group: Individual Therapy   Dierdre Highman, PT, DPT 12/13/18    12:20 PM    12/13/2018, 12:20 PM

## 2018-12-13 NOTE — Progress Notes (Signed)
Per Belva Agee, will be in this morning to assess patient. Continue to monitor and apply pressure dressing.

## 2018-12-14 ENCOUNTER — Inpatient Hospital Stay (HOSPITAL_COMMUNITY): Payer: BLUE CROSS/BLUE SHIELD | Admitting: Physical Therapy

## 2018-12-14 ENCOUNTER — Inpatient Hospital Stay (HOSPITAL_COMMUNITY): Payer: BLUE CROSS/BLUE SHIELD | Admitting: Occupational Therapy

## 2018-12-14 DIAGNOSIS — W19XXXD Unspecified fall, subsequent encounter: Secondary | ICD-10-CM

## 2018-12-14 MED ORDER — ALPRAZOLAM 0.25 MG PO TABS: 0.2500 mg | ORAL_TABLET | Freq: Three times a day (TID) | ORAL | 0 refills | Status: AC | PRN

## 2018-12-14 MED ORDER — GABAPENTIN 300 MG PO CAPS: 300.0000 mg | ORAL_CAPSULE | Freq: Three times a day (TID) | ORAL | 0 refills | Status: DC

## 2018-12-14 MED ORDER — DOCUSATE SODIUM 100 MG PO CAPS: 100.0000 mg | ORAL_CAPSULE | Freq: Every day | ORAL | 0 refills | Status: AC

## 2018-12-14 MED ORDER — METOPROLOL TARTRATE 25 MG PO TABS: 12.5000 mg | ORAL_TABLET | Freq: Two times a day (BID) | ORAL | 0 refills | Status: DC

## 2018-12-14 MED ORDER — ACETAMINOPHEN 325 MG PO TABS: 325.0000 mg | ORAL_TABLET | ORAL | Status: AC | PRN

## 2018-12-14 MED ORDER — METHOCARBAMOL 500 MG PO TABS: 500.0000 mg | ORAL_TABLET | Freq: Three times a day (TID) | ORAL | 0 refills | Status: AC | PRN

## 2018-12-14 MED ORDER — OXYCODONE-ACETAMINOPHEN 5-325 MG PO TABS: 1.0000 | ORAL_TABLET | ORAL | 0 refills | Status: DC | PRN

## 2018-12-14 MED ORDER — PANTOPRAZOLE SODIUM 40 MG PO TBEC: 40.0000 mg | DELAYED_RELEASE_TABLET | Freq: Every day | ORAL | 0 refills | Status: AC

## 2018-12-14 NOTE — Progress Notes (Signed)
[place] PHYSICAL MEDICINE & REHABILITATION PROGRESS NOTE  Subjective/Complaints: Patient seen sitting up in bed this morning.  She states she slept well overnight.  Drainage decreasing, discussed with nursing.  Patient keeps her eyes closed and states that she cannot see me.  ROS: + Stump pain.  Denies CP, shortness of breath, nausea, vomiting, diarrhea.  Objective: Vital Signs: Blood pressure 136/82, pulse 79, temperature 98.1 F (36.7 C), temperature source Oral, resp. rate 12, height 5\' 1"  (1.549 m), weight 82.3 kg, last menstrual period 11/26/2018, SpO2 100 %. No results found. Recent Labs    12/13/18 0446  WBC 7.6  HGB 8.7*  HCT 28.5*  PLT 247   Recent Labs    12/13/18 0446  NA 138  K 4.2  CL 104  CO2 25  GLUCOSE 137*  BUN 9  CREATININE 0.74  CALCIUM 9.2    Physical Exam: BP 136/82 (BP Location: Left Arm)   Pulse 79   Temp 98.1 F (36.7 C) (Oral)   Resp 12   Ht 5\' 1"  (1.549 m)   Wt 82.3 kg   LMP 11/26/2018 (Approximate) Comment: tubal ligation  SpO2 100%   BMI 34.28 kg/m  Constitutional: NAD. Vital signs reviewed. HENT: Normocephalic.  Atraumatic. Eyes: EOMI.  No discharge. Cardiovascular: No JVD. Respiratory: Normal effort. GI: Non-distended. Musc: Left BKA with edema and tenderness with minimal touch, increased due to fall, improving.  Able to almost fully extend knee Neurological: Alert Motor:  Right lower extremity: 4+/5 proximal distal, unchanged Left lower extremity: Hip flexion, knee extension: 4+/5 (pain inhibition), unchanged Skin: BKA site with left para median site with sanguinous drainage between staples, improving. Psychiatric: Atypical affect.  Assessment/Plan: 1. Functional deficits secondary to left BKA which require 3+ hours per day of interdisciplinary therapy in a comprehensive inpatient rehab setting.  Physiatrist is providing close team supervision and 24 hour management of active medical problems listed below.  Physiatrist  and rehab team continue to assess barriers to discharge/monitor patient progress toward functional and medical goals  Care Tool:  Bathing    Body parts bathed by patient: Right arm, Left arm, Chest, Abdomen, Front perineal area, Buttocks, Right upper leg, Left upper leg, Right lower leg, Face     Body parts n/a: Left lower leg   Bathing assist Assist Level: Contact Guard/Touching assist     Upper Body Dressing/Undressing Upper body dressing   What is the patient wearing?: Pull over shirt    Upper body assist Assist Level: Set up assist    Lower Body Dressing/Undressing Lower body dressing      What is the patient wearing?: Pants     Lower body assist Assist for lower body dressing: Contact Guard/Touching assist     Toileting Toileting    Toileting assist Assist for toileting: Contact Guard/Touching assist     Transfers Chair/bed transfer  Transfers assist     Chair/bed transfer assist level: Contact Guard/Touching assist     Locomotion Ambulation   Ambulation assist   Ambulation activity did not occur: Safety/medical concerns  Assist level: Minimal Assistance - Patient > 75% Assistive device: Walker-rolling Max distance: 30'   Walk 10 feet activity   Assist  Walk 10 feet activity did not occur: Safety/medical concerns  Assist level: Minimal Assistance - Patient > 75% Assistive device: Walker-rolling   Walk 50 feet activity   Assist Walk 50 feet with 2 turns activity did not occur: Safety/medical concerns         Walk 150 feet  activity   Assist Walk 150 feet activity did not occur: Safety/medical concerns         Walk 10 feet on uneven surface  activity   Assist Walk 10 feet on uneven surfaces activity did not occur: Safety/medical concerns   Assist level: Minimal Assistance - Patient > 75% Assistive device: PhotographerWalker-rolling   Wheelchair     Assist Will patient use wheelchair at discharge?: Yes Type of Wheelchair:  Manual Wheelchair activity did not occur: Safety/medical concerns  Wheelchair assist level: Set up assist Max wheelchair distance: 150'    Wheelchair 50 feet with 2 turns activity    Assist    Wheelchair 50 feet with 2 turns activity did not occur: Safety/medical concerns   Assist Level: Set up assist   Wheelchair 150 feet activity     Assist Wheelchair 150 feet activity did not occur: Safety/medical concerns   Assist Level: Set up assist      Medical Problem List and Plan: 1.Decreased functional mobilitysecondary to 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 Antithrombotics: -DVT/anticoagulation:Subcutaneous heparin on hold for today -antiplatelet therapy: N/A 3. Pain Management:Oxycodone as needed -robaxin prn spasms -massage and sensory feedback  Gabapentin 300 3 times daily started on 5/18 for uncontrolled phantom limb pain  Improving overall, slight setback after fall  Will require significant encouragement 4. Mood:Xanax 0.25-0.5 mg 3 times daily as needed -antipsychotic agents: N/A 5. Neuropsych: This patientiscapable of making decisions on herown behalf. 6. Skin/Wound Care:Routine skin checks  Stump shrinker ordered   Drainage improving 7. Fluids/Electrolytes/Nutrition:Routine in and outs   BMP within acceptable range on 5/21  Encourage PO 8.Acute blood loss anemia.   Hb 8.7 on 5/21, labs ordered for tomorrow  Cont to monitor 9.Constipation. Laxative assistance  Improving 10.  Hypertension/tachycardia  Metoprolol 12.5 twice daily started on 5/20  Elevated, but improving   LOS: 7 days A FACE TO FACE EVALUATION WAS PERFORMED  Debra Ball Debra Ball Debra Ball 12/14/2018, 9:18 AM

## 2018-12-14 NOTE — Progress Notes (Signed)
Physical Therapy Session Note  Patient Details  Name: Debra Ball MRN: [id number] Date of Birth: August 18, 1975  Today's Date: 12/14/2018 PT Individual Time: 1430-1528 PT Individual Time Calculation (min): 58 min   Short Term Goals: Week 1:  PT Short Term Goal 1 (Week 1): =LTG due to ELOS  Skilled Therapeutic Interventions/Progress Updates:      Therapy Documentation Precautions:  Precautions Precautions: Fall Precaution Comments: new L BKA Restrictions Weight Bearing Restrictions: Yes LLE Weight Bearing: Non weight bearing  Treatment: Pt in wheelchair on arrival to room and agreed to PT at this time. No pain to report at this time.  Pt propelled herself from her room to gym with car. Supervision to stand to RW and 5 feet with pivot turn to car positioned at height of her jeep. Supervision to transfer into/out of car once door was opened for her. 5 feet with RW back to wheelchair. Pt then propelled herself back to room door. Pt ambulated with RW 10 feet from door to her bed, turned 180 degrees and sat down with supervision. Pt able to lie supine independently and turn in bed independently. In bed reviewed HEP being sent home: prone hip extension x 5 reps (reps limited due to reported pain in back), right sidelying for left hip abduction, supine knee to chest stretch. The pt then independent with sitting up to edge of bed. At edge of bed pt performed left knee extension/flexion. Supervision to stand to RW with pt performing hip abduction. Rest break needed due to limb pain. Then pt stood back up to RW with supervision to perform hip extension. All ex's performed for 10 reps unless otherwise stated. Pt then propelled wheelchair in halls to work on distance with max distance of 540 feet achieved today. Pt does not have her shrinker sock/starter sock that fits, per RN multiple calls have been made to [employer]. Call placed by PTA to [employer]. Was informed that someone would be bringing it  today due to pt set to discharge tomorrow. Pt without any questions on HEP, information provided to her. Pt left in room in wheelchair with alarm belt on and all needs in reach.    Therapy/Group: Individual Therapy  Livingston Diones, PTA, CLT 12/14/18, 5:42 PM

## 2018-12-14 NOTE — Progress Notes (Addendum)
Physical Therapy Session Note  Patient Details  Name: Debra Ball MRN: [id number] Date of Birth: 14-Apr-1976  Today's Date: 12/14/2018 PT Individual Time: 4315-4008 PT Individual Time Calculation (min): 30 min   Short Term Goals: Week 1:  PT Short Term Goal 1 (Week 1): =LTG due to ELOS  Skilled Therapeutic Interventions/Progress Updates:      Therapy Documentation Precautions:  Precautions Precautions: Fall Precaution Comments: new L BKA Restrictions Weight Bearing Restrictions: Yes LLE Weight Bearing: Non weight bearing   Treatment: Pt seated in wheelchair on arrival and agreed to PT at this time. No pain.  Pt propelled herself in wheelchair to gym with ramp in it. With wheelchair pt able to ascend/descend ramp without cues or assistance. Pt then propelled herself to gym with stairs. Min guard to stand to RW and ambulate 5 feet to stairs. Min guard to lower to buttocks on stairs with bil rails. Pt then bumped up/down 3 stairs on her bottom. Once at the bottom, min guard to stand with assist of rails and then transfer from rails to RW. 5 feet with RW from stairs back to wheelchair. Pt then propelled herself from gym back to her room. Pt provided with handouts on bumping up stairs in wheelchair and on phantom pain/sensations. Pt left in room in wheelchair with alarm belt on and all needs in reach.    Therapy/Group: Individual Therapy  Livingston Diones, PTA, CLT 12/14/18, 5:30 PM

## 2018-12-14 NOTE — Progress Notes (Signed)
Occupational Therapy Discharge Summary  Patient Details  Name: Debra Ball MRN: [id number] Date of Birth: 12-23-1975  Today's Date: 12/14/2018 OT Individual Time: 0835-0900 OT Individual Time Calculation (min): 25 min   Pt greeted sitting at EOB and agreeable to OT treatment session. Pt reported pain and sensitivity in residual limb today. Educated on desensitizing and visualization techniques with some improvement in pain. OT provided hand-out on limb wrapping and had pt practice on rolled up towels. Discussed dc plan, DME needs, Home modifications, and BADL participation in home environment. Pt left seated EOB at end of session with bed alarm on and needs met.   Patient has met 11 of 11 long term goals due to improved activity tolerance, improved balance, postural control and ability to compensate for deficits.  Patient to discharge at overall supervision/ Modified Independent level.  Patient's care partner is independent to provide the necessary physical assistance at discharge.    Reasons goals not met: n/a  Recommendation:  Patient will benefit from ongoing skilled OT services in home health setting to continue to advance functional skills in the area of BADL. Equipment: 3-in-1 BSC, wheelchair  Reasons for discharge: treatment goals met and discharge from hospital  Patient/family agrees with progress made and goals achieved: Yes  OT Discharge Precautions/Restrictions  Precautions Precautions: Fall Precaution Comments: new L BKA Restrictions Weight Bearing Restrictions: Yes LLE Weight Bearing: Non weight bearing Pain Pain Assessment Pain Scale: 0-10 Pain Score: 7  Pain Type: Acute pain Pain Location: Leg Pain Orientation: Left Pain Descriptors / Indicators: Aching Pain Onset: On-going Pain Intervention(s): Repositioned ADL ADL Eating: Independent Where Assessed-Eating: Bed level Grooming: Independent Where Assessed-Grooming: Sitting at sink,  Wheelchair Upper Body Bathing: Setup, Independent Where Assessed-Upper Body Bathing: Sitting at sink, Wheelchair Lower Body Bathing: Independent Where Assessed-Lower Body Bathing: Shower Upper Body Dressing: Independent Where Assessed-Upper Body Dressing: Sitting at sink, Wheelchair Lower Body Dressing: Modified independent Where Assessed-Lower Body Dressing: Sitting at sink, Wheelchair Toileting: Modified independent Toilet Transfer: Modified independent Tub/Shower Transfer: Close supervison Perception  Perception: Within Functional Limits Praxis Praxis: Intact Cognition Overall Cognitive Status: Within Functional Limits for tasks assessed Arousal/Alertness: Awake/alert Orientation Level: Oriented X4 Focused Attention: Appears intact Memory: Appears intact Awareness: Appears intact Problem Solving: Appears intact Sensation Sensation Light Touch Impaired Details: Impaired LLE Proprioception: Appears Intact Additional Comments: UB sensation intact Coordination Gross Motor Movements are Fluid and Coordinated: No Fine Motor Movements are Fluid and Coordinated: Yes Coordination and Movement Description: continues to have some generalized weakness, but improved since eval Finger Nose Finger Test: good UB coordination Motor  Motor Motor: Within Functional Limits Motor - Discharge Observations: generalized weakness-but much improved since eval Mobility  Bed Mobility Supine to Sit: Independent Sit to Supine: Independent Transfers Sit to Stand: Independent with assistive device  Trunk/Postural Assessment  Cervical Assessment Cervical Assessment: Exceptions to [facility]  Balance Static Sitting Balance Static Sitting - Balance Support: No upper extremity supported;Feet supported Static Sitting - Level of Assistance: 7: Independent Dynamic Sitting Balance Dynamic Sitting - Balance Support: During functional activity Dynamic Sitting - Level of Assistance: 7: Independent Dynamic  Standing Balance Dynamic Standing - Balance Support: During functional activity Dynamic Standing - Level of Assistance: 6: Modified independent (Device/Increase time) Extremity/Trunk Assessment RUE Assessment RUE Assessment: Within Functional Limits General Strength Comments: 5/5 LUE Assessment LUE Assessment: Within Functional Limits General Strength Comments: 5/5   Debra Ball Debra Ball 12/14/2018, 12:55 PM

## 2018-12-14 NOTE — Progress Notes (Signed)
Minimal sleep this shift. Patient awake most of the night talking on the phone. Given prn percocet x2, robaxin, and xanax x2 this shift for pain to surgical area of left leg and anxiety. Pt noted tearful at times when she reports that she is in pain. Pain medication effective, pt much calmer when reassessed.

## 2018-12-14 NOTE — Discharge Instructions (Signed)
Inpatient Rehab Discharge Instructions  Debra Ball Discharge date and time: No discharge date for patient encounter.   Activities/Precautions/ Functional Status: Activity: activity as tolerated Diet: regular diet Wound Care: keep wound clean and dry Functional status:  ___ No restrictions     ___ Walk up steps independently ___ 24/7 supervision/assistance   ___ Walk up steps with assistance ___ Intermittent supervision/assistance  ___ Bathe/dress independently ___ Walk with walker     _x__ Bathe/dress with assistance ___ Walk Independently    ___ Shower independently ___ Walk with assistance    ___ Shower with assistance ___ No alcohol     ___ Return to work/school ________   COMMUNITY REFERRALS UPON DISCHARGE:    Home Health:   PT     OT      RN                      Agency:  [facility]     Phone: [phone]   Medical Equipment/Items Ordered:  Wheelchair, cushion, commode                                                      Agency/Supplier:  [facility] @ [phone]    GENERAL COMMUNITY RESOURCES FOR PATIENT/FAMILY:  Support Groups:  Amputee Support Group (see flyer)       Special Instructions: No driving smoking or alcohol   My questions have been answered and I understand these instructions. I will adhere to these goals and the provided educational materials after my discharge from the hospital.  Patient/Caregiver Signature _______________________________ Date __________  Clinician Signature _______________________________________ Date __________  Please bring this form and your medication list with you to all your follow-up doctor's appointments.

## 2018-12-15 LAB — CBC WITH DIFFERENTIAL/PLATELET
Abs Immature Granulocytes: 0.08 10*3/uL — ABNORMAL HIGH (ref 0.00–0.07)
Basophils Absolute: 0 10*3/uL (ref 0.0–0.1)
Basophils Relative: 0 %
Eosinophils Absolute: 0.1 10*3/uL (ref 0.0–0.5)
Eosinophils Relative: 2 %
HCT: 28.1 % — ABNORMAL LOW (ref 36.0–46.0)
Hemoglobin: 8.4 g/dL — ABNORMAL LOW (ref 12.0–15.0)
Immature Granulocytes: 1 %
Lymphocytes Relative: 31 %
Lymphs Abs: 2.1 10*3/uL (ref 0.7–4.0)
MCH: 25.1 pg — ABNORMAL LOW (ref 26.0–34.0)
MCHC: 29.9 g/dL — ABNORMAL LOW (ref 30.0–36.0)
MCV: 83.9 fL (ref 80.0–100.0)
Monocytes Absolute: 0.5 10*3/uL (ref 0.1–1.0)
Monocytes Relative: 8 %
Neutro Abs: 4 10*3/uL (ref 1.7–7.7)
Neutrophils Relative %: 58 %
Platelets: 286 10*3/uL (ref 150–400)
RBC: 3.35 MIL/uL — ABNORMAL LOW (ref 3.87–5.11)
RDW: 16.6 % — ABNORMAL HIGH (ref 11.5–15.5)
WBC: 6.9 10*3/uL (ref 4.0–10.5)
nRBC: 0 % (ref 0.0–0.2)

## 2018-12-15 NOTE — Progress Notes (Signed)
[place] PHYSICAL MEDICINE & REHABILITATION PROGRESS NOTE  Subjective/Complaints: Patient is excited about going home  ROS: + Stump pain.  Denies CP, shortness of breath, nausea, vomiting, diarrhea.  Objective: Vital Signs: Blood pressure 136/89, pulse (!) 105, temperature 98.6 F (37 C), temperature source Oral, resp. rate 18, height 5\' 1"  (1.549 m), weight 82.3 kg, last menstrual period 11/26/2018, SpO2 100 %. No results found. Recent Labs    12/13/18 0446 12/15/18 0540  WBC 7.6 6.9  HGB 8.7* 8.4*  HCT 28.5* 28.1*  PLT 247 286   Recent Labs    12/13/18 0446  NA 138  K 4.2  CL 104  CO2 25  GLUCOSE 137*  BUN 9  CREATININE 0.74  CALCIUM 9.2    Physical Exam: BP 136/89 (BP Location: Right Arm)   Pulse (!) 105   Temp 98.6 F (37 C) (Oral)   Resp 18   Ht 5\' 1"  (1.549 m)   Wt 82.3 kg   LMP 11/26/2018 (Approximate) Comment: tubal ligation  SpO2 100%   BMI 34.28 kg/m  Constitutional: NAD. Vital signs reviewed. HENT: Normocephalic.  Atraumatic. Eyes: EOMI.  No discharge. Cardiovascular: No JVD. Respiratory: Normal effort. GI: Non-distended. Musc: Left BKA with edema and tenderness with minimal touch, increased due to fall, improving.  Able to almost fully extend knee Neurological: Alert  Skin: BKA site clean see photo below Psychiatric: Bright affect    Assessment/Plan:  1. Functional deficits secondary to left BKA  Stable for D/C today F/u PCP in 3-4 weeks F/u PM&R 2 weeks See D/C summary  See D/C instructions  Care Tool:  Bathing    Body parts bathed by patient: Right arm, Left arm, Chest, Face, Right lower leg, Left upper leg, Right upper leg, Buttocks, Front perineal area, Abdomen     Body parts n/a: Left lower leg   Bathing assist Assist Level: Independent     Upper Body Dressing/Undressing Upper body dressing   What is the patient wearing?: Pull over shirt    Upper body assist Assist Level: Independent    Lower Body  Dressing/Undressing Lower body dressing      What is the patient wearing?: Pants     Lower body assist Assist for lower body dressing: Independent     Toileting Toileting    Toileting assist Assist for toileting: Independent with assistive device     Transfers Chair/bed transfer  Transfers assist     Chair/bed transfer assist level: Independent with assistive device Chair/bed transfer assistive device: Walker, Archivist   Ambulation assist   Ambulation activity did not occur: Safety/medical concerns  Assist level: Supervision/Verbal cueing Assistive device: Walker-rolling Max distance: 30'   Walk 10 feet activity   Assist  Walk 10 feet activity did not occur: Safety/medical concerns  Assist level: Supervision/Verbal cueing Assistive device: Walker-rolling   Walk 50 feet activity   Assist Walk 50 feet with 2 turns activity did not occur: Safety/medical concerns         Walk 150 feet activity   Assist Walk 150 feet activity did not occur: Safety/medical concerns         Walk 10 feet on uneven surface  activity   Assist Walk 10 feet on uneven surfaces activity did not occur: Safety/medical concerns   Assist level: Minimal Assistance - Patient > 75% Assistive device: Photographer Will patient use wheelchair at discharge?: Yes Type of Wheelchair: Manual Wheelchair activity did not  occur: Safety/medical concerns  Wheelchair assist level: Independent Max wheelchair distance: 23>150'    Wheelchair 50 feet with 2 turns activity    Assist    Wheelchair 50 feet with 2 turns activity did not occur: Safety/medical concerns   Assist Level: Independent   Wheelchair 150 feet activity     Assist Wheelchair 150 feet activity did not occur: Safety/medical concerns   Assist Level: Independent      Medical Problem List and Plan: 1.Decreased functional mobilitysecondary to left  BKA 12/04/2018 after failed revascularization procedure  Continue CIR  Plan for d/c today  Will see patient for transitional care management in 1-2 weeks post-discharge 2. Antithrombotics: -DVT/anticoagulation:Subcutaneous heparin on hold for today -antiplatelet therapy: N/A 3. Pain Management:Oxycodone as needed -robaxin prn spasms -massage and sensory feedback  Gabapentin 300 3 times daily started on 5/18 for uncontrolled phantom limb pain  Improving overall, slight setback after fall  Will require significant encouragement 4. Mood:Xanax 0.25-0.5 mg 3 times daily as needed -antipsychotic agents: N/A 5. Neuropsych: This patientiscapable of making decisions on herown behalf. 6. Skin/Wound Care:Routine skin checks  Stump shrinker ordered   Drainage improving 7. Fluids/Electrolytes/Nutrition:Routine in and outs   BMP within acceptable range on 5/21  Encourage PO 8.Acute blood loss anemia.   Hb 8.7 on 5/21, labs ordered for tomorrow  Cont to monitor 9.Constipation. Laxative assistance  Improving 10.  Hypertension/tachycardia  Metoprolol 12.5 twice daily started on 5/20  Elevated, but improving   LOS: 8 days A FACE TO FACE EVALUATION WAS PERFORMED  Debra Ball Debra Ball 12/15/2018, 9:26 AM

## 2018-12-15 NOTE — Plan of Care (Signed)
  Problem: RH BOWEL ELIMINATION Goal: RH STG MANAGE BOWEL W/MEDICATION W/ASSISTANCE Description STG Manage Bowel with Medication Min with Assistance.  Outcome: Progressing   Problem: RH SKIN INTEGRITY Goal: RH STG MAINTAIN SKIN INTEGRITY WITH ASSISTANCE Description STG Maintain Skin Integrity With Assistance. Outcome: Progressing   Problem: RH SAFETY Goal: RH STG ADHERE TO SAFETY PRECAUTIONS W/ASSISTANCE/DEVICE Description STG Adhere to Safety Precautions With Min Assistance/Device.  Outcome: Progressing

## 2018-12-16 NOTE — Progress Notes (Signed)
Patient A&O X 4; vitals are as follows: 98.6, 106, 18, 136/89. Left stump dressed by this RN; no s/s infection. Patient is on the phone with her husband who is waiting downstairs to pick her up. Patient taken down by nurse techs Shanda Bumps and Irving Burton.

## 2018-12-17 NOTE — Progress Notes (Signed)
Social Work Discharge Note  The overall goal for the admission was met for:   Discharge location: Yes - home with spouse who can provide 24/7 support.  Length of Stay: Yes - 8 days  Discharge activity level: Yes - modified independent w/c level  Home/community participation: Yes  Services provided included: MD, RD, PT, OT, RN, Pharmacy and [place]: Private Insurance: [employer] of [place]  Follow-up services arranged: Home Health: RN, PT, OT via [place], DME: 16x16 lightweight e/c with left amputee support pad, cushion, 3n1 commode via [place] and Patient/Family has no preference for HH/DME agencies  Comments (or additional information):  Patient/Family verbalized understanding of follow-up arrangements: Yes  Individual responsible for coordination of the follow-up plan: pt  Confirmed correct DME delivered: Marguerette Sheller 12/17/2018    Evamaria Detore

## 2018-12-18 ENCOUNTER — Telehealth: Payer: Self-pay | Admitting: *Deleted

## 2018-12-18 ENCOUNTER — Telehealth (HOSPITAL_COMMUNITY): Payer: Self-pay

## 2018-12-18 NOTE — Telephone Encounter (Signed)
Telephone call to follow up post discharge home with home health services and husband since her IP rehab was completed. No answer. Voice mail message left with PV navigator's contact information and purpose.   Hilma Favors RN BSN CWS PV Navigator [employer] [phone]

## 2018-12-18 NOTE — Telephone Encounter (Signed)
Transitional care call 1st attempt  Called patients number, went straight to voicemail  Left message to contact our office

## 2018-12-19 ENCOUNTER — Telehealth: Payer: Self-pay

## 2018-12-19 NOTE — Telephone Encounter (Signed)
Pt.notified

## 2018-12-19 NOTE — Telephone Encounter (Signed)
Patient calling stating she only has #2 Percocet left and appt is not until 12/27/2018 for Transitional Care visit. [facility] [place] [place].

## 2018-12-19 NOTE — Telephone Encounter (Signed)
She was instructed that narcotics should be weaned.  She should use desensitization techniques and local measures for pain relief. Thanks.

## 2018-12-19 NOTE — Telephone Encounter (Signed)
Transitional care call 2nd attempt  Called patients number, went straight to voicemail  Left message to contact our office

## 2018-12-20 ENCOUNTER — Telehealth: Payer: Self-pay | Admitting: *Deleted

## 2018-12-20 NOTE — Telephone Encounter (Signed)
Transitional care cal 3rd attempt  Called different number listed for Ursula Alert to voicemail, left message to call back

## 2018-12-20 NOTE — Telephone Encounter (Signed)
Transitional Care Call completed, Appointment confirmed, address confirmed, new patient packet sent  Transitional Care Questions   Questions for our staff to ask patients on Transitional care 48 hour phone call:   1. Are you/is patient experiencing any problems since coming home? Husband states patient is still having pain at amputation site.  He states she is experiencing what appears to be mini-seizures (shaking, chills). Does not understand why she is not able to get any more medication for pain. He states that she has taken them as prescribed.  Are there any questions regarding any aspect of care? No  2. Are there any questions regarding medications administration/dosing?Pain meds? Are meds being taken as prescribed? Yes Patient should review meds with caller to confirm   3. Have there been any falls? No  4. Has Home Health been to the house and/or have they contacted you? Nurse and PT have been out. If not, have you tried to contact them? Can we help you contact them?   5. Are bowels and bladder emptying properly? Yes Are there any unexpected incontinence issues? No If applicable, is patient following bowel/bladder programs?   6. Any fevers, problems with breathing, unexpected pain?  No fever, patient fatigued easily, Pain at surgical site  7. Are there any skin problems or new areas of breakdown? Wound healing nicely according to home health nurse  8. Has the patient/family member arranged specialty MD follow up (ie cardiology/neurology/renal/surgical/etc)? No Can we help arrange?   9. Does the patient need any other services or support that we can help arrange? Patient would like to discuss medical necessity for stand up shower vs. tub  10. Are caregivers following through as expected in assisting the patient? yes  11. Has the patient quit smoking, drinking alcohol, or using drugs as recommended?  Still smoking, no drinking, no illicit drugs

## 2018-12-27 ENCOUNTER — Encounter: Payer: Self-pay | Admitting: Physical Medicine & Rehabilitation

## 2018-12-27 ENCOUNTER — Other Ambulatory Visit: Payer: Self-pay

## 2018-12-27 ENCOUNTER — Encounter
Payer: BC Managed Care – PPO | Attending: Physical Medicine & Rehabilitation | Admitting: Physical Medicine & Rehabilitation

## 2018-12-27 VITALS — BP 118/78 | HR 87 | Ht 61.0 in | Wt 178.0 lb

## 2018-12-27 DIAGNOSIS — G546 Phantom limb syndrome with pain: Secondary | ICD-10-CM | POA: Diagnosis not present

## 2018-12-27 DIAGNOSIS — R269 Unspecified abnormalities of gait and mobility: Secondary | ICD-10-CM | POA: Insufficient documentation

## 2018-12-27 DIAGNOSIS — I10 Essential (primary) hypertension: Secondary | ICD-10-CM | POA: Diagnosis not present

## 2018-12-27 DIAGNOSIS — Z89512 Acquired absence of left leg below knee: Secondary | ICD-10-CM | POA: Insufficient documentation

## 2018-12-27 MED ORDER — GABAPENTIN 600 MG PO TABS: 600.0000 mg | ORAL_TABLET | Freq: Three times a day (TID) | ORAL | 1 refills | Status: DC

## 2018-12-27 NOTE — Progress Notes (Signed)
Subjective:    Patient ID: Debra Ball, female    DOB: [birth year], 43 y.o.   MRN: [id number]  HPI 43 year old right-handed female with a long history of peripheral vascular disease present for transitional care management after receiving CIR for left BKA.  At discharge, she was instructed to follow up with Vascular, which she has not. She continues to have phantom pain. She does not have a stump shrinker. BP/HR have improved. Denies falls.  Therapies: 2/week DME: Bedside commode Mobility: Wheelchair at all times.  Pain Inventory Average Pain 8 Pain Right Now 8 My pain is sharp, burning, tingling and aching  In the last 24 hours, has pain interfered with the following? General activity 9 Relation with others 0 Enjoyment of life 10 What TIME of day is your pain at its worst? all Sleep (in general) Poor  Pain is worse with: bending and unsure Pain improves with: rest and medication Relief from Meds: 8  Mobility walk without assistance use a walker do you drive?  no use a wheelchair  Function not employed: date last employed . I need assistance with the following:  meal prep  Neuro/Psych weakness numbness tingling anxiety  Prior Studies Any changes since last visit?  no  Physicians involved in your care Any changes since last visit?  no   No family history on file. Social History   Socioeconomic History  . Marital status: Married    Spouse name: Not on file  . Number of children: Not on file  . Years of education: Not on file  . Highest education level: Not on file  Occupational History  . Not on file  Social Needs  . Financial resource strain: Not on file  . Food insecurity:    Worry: Not on file    Inability: Not on file  . Transportation needs:    Medical: Not on file    Non-medical: Not on file  Tobacco Use  . Smoking status: Current Every Day Smoker    Types: Cigarettes  . Smokeless tobacco: Never Used  Substance and Sexual  Activity  . Alcohol use: Yes    Comment: occ  . Drug use: Never  . Sexual activity: Not on file  Lifestyle  . Physical activity:    Days per week: Not on file    Minutes per session: Not on file  . Stress: Not on file  Relationships  . Social connections:    Talks on phone: Not on file    Gets together: Not on file    Attends religious service: Not on file    Active member of club or organization: Not on file    Attends meetings of clubs or organizations: Not on file    Relationship status: Not on file  Other Topics Concern  . Not on file  Social History Narrative  . Not on file   Past Surgical History:  Procedure Laterality Date  . AMPUTATION Left 12/04/2018   Procedure: LEFT BELOW KNEE AMPUTATION;  Surgeon: Chuck Hintickson, Christopher S, MD;  [facility];  Service: Vascular;  Laterality: Left;  . AORTOGRAM Left 12/01/2018   Procedure: Abdominal Aortogram with left leg runoff;  Surgeon: Nada LibmanBrabham, Vance W, MD;  [facility];  Service: Vascular;  Laterality: Left;  . FEMORAL-TIBIAL BYPASS GRAFT Left 11/07/2018   Procedure: THROMBECTOMY POPLITEAL, ANTERIOR TIBIAL ARTERY, POSTERIOR TIBIAL ARTERY, AND VEIN PATCH ANTERIOR ARTERY;  Surgeon: Nada LibmanBrabham, Vance W, MD;  Location: [facility] OR;  Service: Vascular;  Laterality: Left;  .  LOWER EXTREMITY ANGIOGRAM Left 12/02/2018   Procedure: Left Lower Extremity Angiogram;  Surgeon: Nada Libman, MD;  Location: [facility] OR;  Service: Vascular;  Laterality: Left;  . LOWER EXTREMITY ANGIOGRAPHY Bilateral 11/06/2018   Procedure: LOWER EXTREMITY ANGIOGRAPHY;  Surgeon: Nada Libman, MD;  [facility] INVASIVE [facility] LAB;  Service: Cardiovascular;  Laterality: Bilateral;  And ABD   . PATCH ANGIOPLASTY Left 12/01/2018   Procedure: Patch Angioplasty with xenosure Biological patch on Left tibia fibula trunk;  Surgeon: Nada Libman, MD;  Location: [facility] OR;  Service: Vascular;  Laterality: Left;  . TEE WITHOUT CARDIOVERSION  12/01/2018   Procedure: Transesophageal  Echocardiogram (Tee);  Surgeon: Nada Libman, MD;  Location: [facility] OR;  Service: Vascular;;  . THROMBECTOMY FEMORAL ARTERY Left 12/01/2018   Procedure: Thrombectomy left leg, SFA, Popliteal artery, posterior tibial artery, and anterior itbial artery, Redo popliteal exposure, posterior tibia exposure;  Surgeon: Nada Libman, MD;  Location: [facility] OR;  Service: Vascular;  Laterality: Left;  . THROMBECTOMY FEMORAL ARTERY Left 12/02/2018   Procedure: Thrombectomy left leg, SFA, Popliteal artery, posterior tibial artery, and anterior tibial artery, Redo popliteal exposure, posterior tibia exposure, Patch Angioplasty with xenosure Biological patch on Left tibia fibula trunk, Abdominal Aortogram with left leg runoff;  Surgeon: Nada Libman, MD;  Location: [facility] OR;  Service: Vascular;  Laterality: Left;  . THROMBECTOMY FEMORAL ARTERY  12/02/2018   Procedure: Thrombectomy of left SFA, popliteal artery, posterior tibial artery, and anterior tibial artery;  Surgeon: Nada Libman, MD;  Location: [facility] OR;  Service: Vascular;;   Past Medical History:  Diagnosis Date  . Pregnancy induced hypertension    BP 118/78   Pulse 87   Ht 5\' 1"  (1.549 m)   Wt 178 lb (80.7 kg)   LMP 11/26/2018   SpO2 97%   BMI 33.63 kg/m   Opioid Risk Score:   Fall Risk Score:  `1  Depression screen PHQ 2/9  No flowsheet data found.   Review of Systems  Respiratory: Positive for shortness of breath.   Musculoskeletal: Positive for arthralgias and myalgias.  Neurological: Positive for weakness and numbness.  Psychiatric/Behavioral: The patient is nervous/anxious.       Objective:   Physical Exam Constitutional: NAD. Vital signs reviewed. HENT: Normocephalic.  Atraumatic. Eyes: EOMI. No discharge. Cardiovascular: No JVD. Respiratory: Normal effort. GI: Non-distended. Musc: Left BKA with edema and tenderness Neurological: Alert Motor:  Right lower extremity: 5/5 proximal distal Left lower extremity: Hip flexion,  knee extension: 5/5  Skin: BKA healing with staples Psychiatric: Atypical affect.    Assessment & Plan:  43 year old right-handed female with a long history of peripheral vascular disease present for transitional care management after receiving CIR for left BKA.  1.  Decreased functional mobility secondary to left BKA 12/04/2018 after failed revascularization procedure  Cont therapies  Follow up with Vascular - needs appointment  Stump shrinker ordered  Educated on dressing changes  Discourages use of pillow  2. Phantom limb pain   Will increase Gabapentin to 600 TID  3.  Constipation.  Laxative assistance             Improved  4.  Hypertension/tachycardia  Improved - d/c metoprolol  5. Gait abnormality  Cont wheelchair for safety

## 2019-01-09 ENCOUNTER — Encounter: Payer: Self-pay | Admitting: Vascular Surgery

## 2019-01-09 ENCOUNTER — Ambulatory Visit (INDEPENDENT_AMBULATORY_CARE_PROVIDER_SITE_OTHER): Payer: Self-pay | Admitting: Vascular Surgery

## 2019-01-09 ENCOUNTER — Other Ambulatory Visit: Payer: Self-pay

## 2019-01-09 VITALS — BP 127/85 | HR 94 | Temp 97.7°F | Resp 18 | Ht 61.0 in | Wt 178.0 lb

## 2019-01-09 DIAGNOSIS — I739 Peripheral vascular disease, unspecified: Secondary | ICD-10-CM

## 2019-01-09 DIAGNOSIS — Z48812 Encounter for surgical aftercare following surgery on the circulatory system: Secondary | ICD-10-CM

## 2019-01-09 NOTE — Progress Notes (Signed)
   Patient name: Debra Ball MRN: [id number] DOB: [birth year] Sex: female  REASON FOR VISIT:   Follow-up after left below the knee amputation.  HPI:   Debra Ball is a pleasant 43 y.o. female who underwent attempted revascularization of acutely ischemic leg.  Ultimately the leg could not be salvaged and she presented for below the knee amputation.  She underwent below the knee amputation on 12/04/2018.  She comes in for a one-month follow-up visit.  She has no specific complaints.  Current Outpatient Medications  Medication Sig Dispense Refill  . acetaminophen (TYLENOL) 325 MG tablet Take 1-2 tablets (325-650 mg total) by mouth every 4 (four) hours as needed for mild pain (or temp >/= 101 F).    . gabapentin (NEURONTIN) 600 MG tablet Take 1 tablet (600 mg total) by mouth 3 (three) times daily. 90 tablet 1  . ALPRAZolam (XANAX) 0.25 MG tablet Take 1-2 tablets (0.25-0.5 mg total) by mouth 3 (three) times daily as needed for anxiety. (Patient not taking: Reported on 01/09/2019) 30 tablet 0  . docusate sodium (COLACE) 100 MG capsule Take 1 capsule (100 mg total) by mouth daily. (Patient not taking: Reported on 01/09/2019) 10 capsule 0  . methocarbamol (ROBAXIN) 500 MG tablet Take 1 tablet (500 mg total) by mouth every 8 (eight) hours as needed for muscle spasms. (Patient not taking: Reported on 01/09/2019) 60 tablet 0  . pantoprazole (PROTONIX) 40 MG tablet Take 1 tablet (40 mg total) by mouth daily. (Patient not taking: Reported on 01/09/2019) 30 tablet 0   No current facility-administered medications for this visit.     REVIEW OF SYSTEMS:  [X]  denotes positive finding, [ ]  denotes negative finding Vascular    Leg swelling    Cardiac    Chest pain or chest pressure:    Shortness of breath upon exertion:    Short of breath when lying flat:    Irregular heart rhythm:    Constitutional    Fever or chills:     PHYSICAL EXAM:   Vitals:   01/09/19 1128  BP: 127/85  Pulse:  94  Resp: 18  Temp: 97.7 F (36.5 C)  SpO2: 100%  Weight: 178 lb (80.7 kg)  Height: 5\' 1"  (1.549 m)    GENERAL: The patient is a well-nourished female, in no acute distress. The vital signs are documented above. CARDIOVASCULAR: There is a regular rate and rhythm. PULMONARY: There is good air exchange bilaterally without wheezing or rales. Her amputation site is healing nicely. She has 1 small eschar where the incisions come together.     DATA:   No new data  MEDICAL ISSUES:   STATUS POST LEFT BELOW THE KNEE AMPUTATION: Patient is doing well status post left below the knee amputation.  She has 1 small eschar where the incisions come together.  We will bring her back in 2 or 3 weeks to have this checked.  She knows to call sooner if she has problems.  Deitra Mayo [facility] [phone]

## 2019-01-11 ENCOUNTER — Ambulatory Visit (HOSPITAL_COMMUNITY): Payer: Self-pay

## 2019-01-11 DIAGNOSIS — I998 Other disorder of circulatory system: Secondary | ICD-10-CM

## 2019-01-11 DIAGNOSIS — I70229 Atherosclerosis of native arteries of extremities with rest pain, unspecified extremity: Secondary | ICD-10-CM

## 2019-01-24 ENCOUNTER — Other Ambulatory Visit: Payer: Self-pay

## 2019-01-24 ENCOUNTER — Encounter: Payer: Self-pay | Admitting: Physical Medicine & Rehabilitation

## 2019-01-24 ENCOUNTER — Encounter
Payer: BC Managed Care – PPO | Attending: Physical Medicine & Rehabilitation | Admitting: Physical Medicine & Rehabilitation

## 2019-01-24 VITALS — BP 129/85 | HR 75 | Temp 97.9°F

## 2019-01-24 DIAGNOSIS — I739 Peripheral vascular disease, unspecified: Secondary | ICD-10-CM

## 2019-01-24 DIAGNOSIS — R269 Unspecified abnormalities of gait and mobility: Secondary | ICD-10-CM | POA: Insufficient documentation

## 2019-01-24 DIAGNOSIS — Z89512 Acquired absence of left leg below knee: Secondary | ICD-10-CM

## 2019-01-24 DIAGNOSIS — G546 Phantom limb syndrome with pain: Secondary | ICD-10-CM | POA: Diagnosis not present

## 2019-01-24 DIAGNOSIS — T8189XA Other complications of procedures, not elsewhere classified, initial encounter: Secondary | ICD-10-CM

## 2019-01-24 DIAGNOSIS — I10 Essential (primary) hypertension: Secondary | ICD-10-CM | POA: Insufficient documentation

## 2019-01-24 MED ORDER — GABAPENTIN 600 MG PO TABS: 1200.0000 mg | ORAL_TABLET | Freq: Three times a day (TID) | ORAL | 2 refills | Status: DC

## 2019-01-24 NOTE — Progress Notes (Signed)
Subjective:    Patient ID: Finis Bud, female    DOB: 02/20/1976, 43 y.o.   MRN: [id number]  HPI 43 year old right-handed female with a long history of peripheral vascular disease present for follow up for left BKA.  Last clinic visit 12/27/2018. Since that time, pt states she completed therapies.  She saw Surgery. She is wearing stump shrinker. She is not having benefit with  Gabapentin. She had a fall after tripping on a cord. BP is controlled. Walker at home, wheelchair in community.  Pain Inventory Average Pain 8 Pain Right Now 8 My pain is sharp, burning, tingling and aching  In the last 24 hours, has pain interfered with the following? General activity 9 Relation with others 0 Enjoyment of life 10 What TIME of day is your pain at its worst? all Sleep (in general) Poor  Pain is worse with: bending and unsure Pain improves with: rest and medication Relief from Meds: 8  Mobility walk without assistance use a walker do you drive?  no use a wheelchair  Function not employed: date last employed . I need assistance with the following:  meal prep  Neuro/Psych weakness numbness tingling anxiety  Prior Studies Any changes since last visit?  no  Physicians involved in your care Any changes since last visit?  no   No family history on file. Social History   Socioeconomic History  . Marital status: Married    Spouse name: Not on file  . Number of children: Not on file  . Years of education: Not on file  . Highest education level: Not on file  Occupational History  . Not on file  Social Needs  . Financial resource strain: Not on file  . Food insecurity    Worry: Not on file    Inability: Not on file  . Transportation needs    Medical: Not on file    Non-medical: Not on file  Tobacco Use  . Smoking status: Current Every Day Smoker    Packs/day: 0.25    Types: Cigarettes  . Smokeless tobacco: Never Used  Substance and Sexual Activity  .  Alcohol use: Yes    Comment: occ  . Drug use: Never  . Sexual activity: Not on file  Lifestyle  . Physical activity    Days per week: Not on file    Minutes per session: Not on file  . Stress: Not on file  Relationships  . Social Herbalist on phone: Not on file    Gets together: Not on file    Attends religious service: Not on file    Active member of club or organization: Not on file    Attends meetings of clubs or organizations: Not on file    Relationship status: Not on file  Other Topics Concern  . Not on file  Social History Narrative  . Not on file   Past Surgical History:  Procedure Laterality Date  . AMPUTATION Left 12/04/2018   Procedure: LEFT BELOW KNEE AMPUTATION;  Surgeon: Angelia Mould, MD;  Location: [place];  Service: Vascular;  Laterality: Left;  . AORTOGRAM Left 12/01/2018   Procedure: Abdominal Aortogram with left leg runoff;  Surgeon: Serafina Mitchell, MD;  Location: [place];  Service: Vascular;  Laterality: Left;  . FEMORAL-TIBIAL BYPASS GRAFT Left 11/07/2018   Procedure: THROMBECTOMY POPLITEAL, ANTERIOR TIBIAL ARTERY, POSTERIOR TIBIAL ARTERY, AND VEIN PATCH ANTERIOR ARTERY;  Surgeon: Serafina Mitchell, MD;  Location: [place];  Service:  Vascular;  Laterality: Left;  . LOWER EXTREMITY ANGIOGRAM Left 12/02/2018   Procedure: Left Lower Extremity Angiogram;  Surgeon: Nada LibmanBrabham, Vance W, MD;  [facility];  Service: Vascular;  Laterality: Left;  . LOWER EXTREMITY ANGIOGRAPHY Bilateral 11/06/2018   Procedure: LOWER EXTREMITY ANGIOGRAPHY;  Surgeon: Nada LibmanBrabham, Vance W, MD;  Location: [facility] INV[facility] LAB;  Service: Cardiovascular;  Laterality: Bilateral;  And ABD   . PATCH ANGIOPLASTY Left 12/01/2018   Procedure: Patch Angioplasty with xenosure Biological patch on Left tibia fibula trunk;  Surgeon: Nada LibmanBrabham, Vance W, MD;  [facility];  Service: Vascular;  Laterality: Left;  . TEE WITHOUT CARDIOVERSION  12/01/2018   Procedure: Transesophageal Echocardiogram (Tee);   Surgeon: Nada LibmanBrabham, Vance W, MD;  [facility];  Service: Vascular;;  . THROMBECTOMY FEMORAL ARTERY Left 12/01/2018   Procedure: Thrombectomy left leg, SFA, Popliteal artery, posterior tibial artery, and anterior itbial artery, Redo popliteal exposure, posterior tibia exposure;  Surgeon: Nada LibmanBrabham, Vance W, MD;  Location: [facility] OR;  Service: Vascular;  Laterality: Left;  . THROMBECTOMY FEMORAL ARTERY Left 12/02/2018   Procedure: Thrombectomy left leg, SFA, Popliteal artery, posterior tibial artery, and anterior tibial artery, Redo popliteal exposure, posterior tibia exposure, Patch Angioplasty with xenosure Biological patch on Left tibia fibula trunk, Abdominal Aortogram with left leg runoff;  Surgeon: Nada LibmanBrabham, Vance W, MD;  Location: [facility] OR;  Service: Vascular;  Laterality: Left;  . THROMBECTOMY FEMORAL ARTERY  12/02/2018   Procedure: Thrombectomy of left SFA, popliteal artery, posterior tibial artery, and anterior tibial artery;  Surgeon: Nada LibmanBrabham, Vance W, MD;  Location: [facility];  Service: Vascular;;   Past Medical History:  Diagnosis Date  . Pregnancy induced hypertension    BP 129/85   Pulse 75   Temp 97.9 F (36.6 C)   SpO2 98%   Opioid Risk Score:   Fall Risk Score:  `1  Depression screen PHQ 2/9  Depression screen PHQ 2/9 01/24/2019  Decreased Interest 0  Down, Depressed, Hopeless 0  PHQ - 2 Score 0   Review of Systems  Respiratory: Positive for shortness of breath.   Musculoskeletal: Positive for arthralgias and myalgias.  Neurological: Positive for weakness and numbness.  Psychiatric/Behavioral: The patient is nervous/anxious.       Objective:   Physical Exam Constitutional: NAD. Vital signs reviewed. HENT: Normocephalic. Atraumatic.  Eyes: EOMI. No discharge. Cardiovascular: No JVD. Respiratory: Normal effort. GI: Non-distended. Musc: Left BKA with edema and tenderness Neurological: Alert Motor:  Right lower extremity: 5/5 proximal distal Left lower extremity: Hip flexion,  knee extension: 5/5  Skin: BKA with ulcer along lateral side Psychiatric: Normal mood and affect.    Assessment & Plan:  43 year old right-handed female with a long history of peripheral vascular disease present for follow up for left BKA.  1.  Decreased functional mobility secondary to left BKA 12/04/2018 after failed revascularization procedure, now with ulceration  Cont HEP  Cont follow up with Vascular   Cont stump shrinker   Avoid lotion over ulceration  Educated on wet/dry dressing changes   2. Phantom limb pain   Will increase Gabapentin to 1200 TID  Encouraged desensitization  3.  Constipation.    Cont Laxative              Controlled  4.  Hypertension/tachycardia  Controlled off meds  5. Gait abnormality  Cont walker/wheelchair for safety

## 2019-01-30 ENCOUNTER — Ambulatory Visit (INDEPENDENT_AMBULATORY_CARE_PROVIDER_SITE_OTHER): Payer: Self-pay | Admitting: Vascular Surgery

## 2019-01-30 ENCOUNTER — Other Ambulatory Visit: Payer: Self-pay

## 2019-01-30 ENCOUNTER — Encounter: Payer: Self-pay | Admitting: Vascular Surgery

## 2019-01-30 VITALS — BP 134/84 | HR 79 | Temp 97.4°F | Resp 20 | Ht 61.0 in | Wt 178.0 lb

## 2019-01-30 DIAGNOSIS — Z48812 Encounter for surgical aftercare following surgery on the circulatory system: Secondary | ICD-10-CM

## 2019-01-30 DIAGNOSIS — I739 Peripheral vascular disease, unspecified: Secondary | ICD-10-CM

## 2019-01-30 NOTE — Progress Notes (Signed)
   Patient name: Debra Ball MRN: [id number] DOB: 15-Jul-1976 Sex: female  REASON FOR VISIT:   Follow-up after left below the knee amputation.  HPI:   Debra Ball is a pleasant 43 y.o. female who had undergone attempted revascularization for an acutely ischemic leg.  Ultimately the leg could not be salvaged and I was asked to perform below the knee amputation.  This was done on 12/04/2018.  I last saw her on 01/09/2019 and at that time she had 1 small eschar where 2 incisions came together.  I brought her in for a 2 to 3-week follow-up visit.  Since I saw her last, she denies any specific problems.  Current Outpatient Medications  Medication Sig Dispense Refill  . acetaminophen (TYLENOL) 325 MG tablet Take 1-2 tablets (325-650 mg total) by mouth every 4 (four) hours as needed for mild pain (or temp >/= 101 F).    . ALPRAZolam (XANAX) 0.25 MG tablet Take 1-2 tablets (0.25-0.5 mg total) by mouth 3 (three) times daily as needed for anxiety. 30 tablet 0  . docusate sodium (COLACE) 100 MG capsule Take 1 capsule (100 mg total) by mouth daily. 10 capsule 0  . ELIQUIS 5 MG TABS tablet Take 5 mg by mouth 2 (two) times daily.    Marland Kitchen gabapentin (NEURONTIN) 600 MG tablet Take 2 tablets (1,200 mg total) by mouth 3 (three) times daily. 180 tablet 2  . methocarbamol (ROBAXIN) 500 MG tablet Take 1 tablet (500 mg total) by mouth every 8 (eight) hours as needed for muscle spasms. 60 tablet 0  . metoprolol tartrate (LOPRESSOR) 25 MG tablet 12.5 mg 2 (two) times daily.    . pantoprazole (PROTONIX) 40 MG tablet Take 1 tablet (40 mg total) by mouth daily. 30 tablet 0   No current facility-administered medications for this visit.     REVIEW OF SYSTEMS:  [X]  denotes positive finding, [ ]  denotes negative finding Vascular    Leg swelling    Cardiac    Chest pain or chest pressure:    Shortness of breath upon exertion:    Short of breath when lying flat:    Irregular heart rhythm:     Constitutional    Fever or chills:     PHYSICAL EXAM:   Vitals:   01/30/19 0911  BP: 134/84  Pulse: 79  Resp: 20  Temp: (!) 97.4 F (36.3 C)  SpO2: 97%  Weight: 178 lb (80.7 kg)  Height: 5\' 1"  (1.549 m)    GENERAL: The patient is a well-nourished female, in no acute distress. The vital signs are documented above. CARDIOVASCULAR: There is a regular rate and rhythm. PULMONARY: There is good air exchange bilaterally without wheezing or rales. Her wound on the lateral aspect of her left below the knee amputation is healing well.  It is less than a centimeter in diameter.  I did some excisional debridement in the office today.  LEFT BKA:     DATA:   No new data  MEDICAL ISSUES:   STATUS POST LEFT BELOW THE KNEE AMPUTATION: This patient underwent a left below the knee amputation on 12/04/2018.  This is now almost completely healed.  I instructed her on how to pack the small open area on the lateral aspect of her BKA.  She should be able to get fitted for a prosthesis as soon as this is healed.  I will see her back as needed.  Deitra Mayo [facility] [phone]

## 2019-02-21 ENCOUNTER — Other Ambulatory Visit: Payer: Self-pay | Admitting: Physical Medicine & Rehabilitation

## 2019-02-21 ENCOUNTER — Encounter: Payer: Self-pay | Admitting: Physical Medicine & Rehabilitation

## 2019-02-21 ENCOUNTER — Other Ambulatory Visit: Payer: Self-pay

## 2019-02-21 ENCOUNTER — Encounter (HOSPITAL_BASED_OUTPATIENT_CLINIC_OR_DEPARTMENT_OTHER): Payer: BC Managed Care – PPO | Admitting: Physical Medicine & Rehabilitation

## 2019-02-21 ENCOUNTER — Other Ambulatory Visit: Payer: Self-pay | Admitting: *Deleted

## 2019-02-21 VITALS — BP 137/69 | HR 83 | Temp 98.5°F

## 2019-02-21 DIAGNOSIS — G546 Phantom limb syndrome with pain: Secondary | ICD-10-CM | POA: Diagnosis not present

## 2019-02-21 DIAGNOSIS — R269 Unspecified abnormalities of gait and mobility: Secondary | ICD-10-CM

## 2019-02-21 DIAGNOSIS — L98492 Non-pressure chronic ulcer of skin of other sites with fat layer exposed: Secondary | ICD-10-CM

## 2019-02-21 DIAGNOSIS — Z89512 Acquired absence of left leg below knee: Secondary | ICD-10-CM | POA: Diagnosis not present

## 2019-02-21 MED ORDER — PREGABALIN 50 MG PO CAPS: 50.0000 mg | ORAL_CAPSULE | Freq: Three times a day (TID) | ORAL | 1 refills | Status: DC

## 2019-02-21 NOTE — Progress Notes (Signed)
Subjective:    Patient ID: Debra Ball, female    DOB: 10/09/1975, 43 y.o.   MRN: [id number]  HPI Right-handed female with a long history of peripheral vascular disease present for follow up for left BKA.  Last clinic visit 01/24/2019.  Since that time, she was seen by Vascular.  Notes reviewed - healing wound. She continues to wear stump shrinker and perform dressing changes. She denies benefit with Gabapentin. BP is controlled. Denies falls.  Pain Inventory Average Pain 10 Pain Right Now 10 My pain is sharp, burning, tingling and aching  In the last 24 hours, has pain interfered with the following? General activity 9 Relation with others 0 Enjoyment of life 10 What TIME of day is your pain at its worst? all Sleep (in general) Poor  Pain is worse with: bending and unsure Pain improves with: rest and medication Relief from Meds: 8  Mobility walk without assistance use a walker do you drive?  no use a wheelchair  Function not employed: date last employed . I need assistance with the following:  meal prep  Neuro/Psych weakness numbness tingling anxiety  Prior Studies Any changes since last visit?  no  Physicians involved in your care Any changes since last visit?  no   No family history on file. Social History   Socioeconomic History  . Marital status: Married    Spouse name: Not on file  . Number of children: Not on file  . Years of education: Not on file  . Highest education level: Not on file  Occupational History  . Not on file  Social Needs  . Financial resource strain: Not on file  . Food insecurity    Worry: Not on file    Inability: Not on file  . Transportation needs    Medical: Not on file    Non-medical: Not on file  Tobacco Use  . Smoking status: Current Every Day Smoker    Packs/day: 0.25    Types: Cigarettes  . Smokeless tobacco: Never Used  Substance and Sexual Activity  . Alcohol use: Yes    Comment: occ  . Drug use:  Never  . Sexual activity: Not on file  Lifestyle  . Physical activity    Days per week: Not on file    Minutes per session: Not on file  . Stress: Not on file  Relationships  . Social Musicianconnections    Talks on phone: Not on file    Gets together: Not on file    Attends religious service: Not on file    Active member of club or organization: Not on file    Attends meetings of clubs or organizations: Not on file    Relationship status: Not on file  Other Topics Concern  . Not on file  Social History Narrative  . Not on file   Past Surgical History:  Procedure Laterality Date  . AMPUTATION Left 12/04/2018   Procedure: LEFT BELOW KNEE AMPUTATION;  Surgeon: Chuck Hintickson, Christopher S, MD;  [facility];  Service: Vascular;  Laterality: Left;  . AORTOGRAM Left 12/01/2018   Procedure: Abdominal Aortogram with left leg runoff;  Surgeon: Nada LibmanBrabham, Vance W, MD;  [facility];  Service: Vascular;  Laterality: Left;  . FEMORAL-TIBIAL BYPASS GRAFT Left 11/07/2018   Procedure: THROMBECTOMY POPLITEAL, ANTERIOR TIBIAL ARTERY, POSTERIOR TIBIAL ARTERY, AND VEIN PATCH ANTERIOR ARTERY;  Surgeon: Nada LibmanBrabham, Vance W, MD;  [facility];  Service: Vascular;  Laterality: Left;  . LOWER EXTREMITY ANGIOGRAM  Left 12/02/2018   Procedure: Left Lower Extremity Angiogram;  Surgeon: Serafina Mitchell, MD;  Location: [place];  Service: Vascular;  Laterality: Left;  . LOWER EXTREMITY ANGIOGRAPHY Bilateral 11/06/2018   Procedure: LOWER EXTREMITY ANGIOGRAPHY;  Surgeon: Serafina Mitchell, MD;  Location: [place] CV LAB;  Service: Cardiovascular;  Laterality: Bilateral;  And ABD   . PATCH ANGIOPLASTY Left 12/01/2018   Procedure: Patch Angioplasty with xenosure Biological patch on Left tibia fibula trunk;  Surgeon: Serafina Mitchell, MD;  Location: [place];  Service: Vascular;  Laterality: Left;  . TEE WITHOUT CARDIOVERSION  12/01/2018   Procedure: Transesophageal Echocardiogram (Tee);  Surgeon: Serafina Mitchell, MD;  Location: [facility] OR;   Service: Vascular;;  . THROMBECTOMY FEMORAL ARTERY Left 12/01/2018   Procedure: Thrombectomy left leg, SFA, Popliteal artery, posterior tibial artery, and anterior itbial artery, Redo popliteal exposure, posterior tibia exposure;  Surgeon: Serafina Mitchell, MD;  Location: [place];  Service: Vascular;  Laterality: Left;  . THROMBECTOMY FEMORAL ARTERY Left 12/02/2018   Procedure: Thrombectomy left leg, SFA, Popliteal artery, posterior tibial artery, and anterior tibial artery, Redo popliteal exposure, posterior tibia exposure, Patch Angioplasty with xenosure Biological patch on Left tibia fibula trunk, Abdominal Aortogram with left leg runoff;  Surgeon: Serafina Mitchell, MD;  Location: [place];  Service: Vascular;  Laterality: Left;  . THROMBECTOMY FEMORAL ARTERY  12/02/2018   Procedure: Thrombectomy of left SFA, popliteal artery, posterior tibial artery, and anterior tibial artery;  Surgeon: Serafina Mitchell, MD;  Location: [place];  Service: Vascular;;   Past Medical History:  Diagnosis Date  . Pregnancy induced hypertension    BP 137/69   Pulse 83   Temp 98.5 F (36.9 C)   SpO2 98%   Opioid Risk Score:   Fall Risk Score:  `1  Depression screen PHQ 2/9  Depression screen PHQ 2/9 01/24/2019  Decreased Interest 0  Down, Depressed, Hopeless 0  PHQ - 2 Score 0   Review of Systems  Respiratory: Positive for shortness of breath.   Musculoskeletal: Positive for arthralgias and myalgias.  Neurological: Positive for weakness and numbness.  Psychiatric/Behavioral: The patient is nervous/anxious.   All other systems reviewed and are negative.     Objective:   Physical Exam Constitutional: NAD. Vital signs reviewed. HENT: Normcephalic. Atraumatic.  Eyes: EOMI. No discharge. Cardiovascular: No JVD. Respiratory: Normal effort. GI: Non-distended. Musc: Left BKA with edema Neurological: Alert Motor:  Right lower extremity: 5/5 proximal distal Left lower extremity: Hip flexion, knee extension:  5/5  Skin: BKA with ulcer along lateral side Psychiatric: Normal mood and affect.    Assessment & Plan:  Right-handed female with a long history of peripheral vascular disease present for follow up for left BKA.  1.  Decreased functional mobility secondary to left BKA 12/04/2018 after failed revascularization procedure, now with ulceration  Cont HEP  Cont follow up with Vascular   Cont stump shrinker   Avoid lotion over ulceration  Cont wet/dry dressing changes - encouraged packing of ulcer. Will consider packing with iodoform gauze if no improvement  Will hold off on prosthesis due to ulcer  Will order therapies after prosthesis  2. Phantom limb pain   No benefit with Gabapentin  Will order Lyrica 50 TID  Encouraged desensitization  3.  Constipation.    Cont Laxative              Stable  4. Gait abnormality  Cont walker/wheelchair for safety

## 2019-03-07 ENCOUNTER — Encounter: Payer: Self-pay | Admitting: Physical Medicine & Rehabilitation

## 2019-03-07 ENCOUNTER — Encounter: Payer: Medicaid Other | Attending: Physical Medicine & Rehabilitation | Admitting: Physical Medicine & Rehabilitation

## 2019-03-07 ENCOUNTER — Other Ambulatory Visit: Payer: Self-pay

## 2019-03-07 VITALS — BP 156/91 | HR 91 | Temp 98.6°F

## 2019-03-07 DIAGNOSIS — Z89512 Acquired absence of left leg below knee: Secondary | ICD-10-CM | POA: Diagnosis present

## 2019-03-07 DIAGNOSIS — I10 Essential (primary) hypertension: Secondary | ICD-10-CM | POA: Diagnosis present

## 2019-03-07 DIAGNOSIS — R269 Unspecified abnormalities of gait and mobility: Secondary | ICD-10-CM | POA: Diagnosis not present

## 2019-03-07 DIAGNOSIS — L98492 Non-pressure chronic ulcer of skin of other sites with fat layer exposed: Secondary | ICD-10-CM

## 2019-03-07 DIAGNOSIS — G546 Phantom limb syndrome with pain: Secondary | ICD-10-CM | POA: Diagnosis not present

## 2019-03-07 MED ORDER — PREGABALIN 100 MG PO CAPS: 100.0000 mg | ORAL_CAPSULE | Freq: Three times a day (TID) | ORAL | 1 refills | Status: DC

## 2019-03-07 MED ORDER — CURITY IODOFORM PACKING STRIP MISC: 1.0000 | Freq: Every day | 1 refills | Status: AC

## 2019-03-07 NOTE — Progress Notes (Addendum)
Subjective:    Patient ID: Debra Ball, female    DOB: 02-Jul-1976, 43 y.o.   MRN: [id number]  HPI Right-handed female with a long history of peripheral vascular disease present for follow up for left BKA.  Last clinic visit 02/21/2019.  Seen and examined by resident physician independently, and then then with physician.  She notes she is taking Oxy from family.  She does not some benefit with Lyrica. Denies falls.   Pain Inventory Average Pain 7 Pain Right Now 7 My pain is sharp, burning, tingling and aching  In the last 24 hours, has pain interfered with the following? General activity 7 Relation with others 7 Enjoyment of life 7 What TIME of day is your pain at its worst? all Sleep (in general) Poor  Pain is worse with: bending and unsure Pain improves with: rest and medication Relief from Meds: 8  Mobility walk without assistance use a walker do you drive?  no use a wheelchair transfers alone  Function not employed: date last employed . I need assistance with the following:  meal prep  Neuro/Psych weakness numbness tingling anxiety  Prior Studies Any changes since last visit?  no  Physicians involved in your care Any changes since last visit?  no   No family history on file. Social History   Socioeconomic History  . Marital status: Married    Spouse name: Not on file  . Number of children: Not on file  . Years of education: Not on file  . Highest education level: Not on file  Occupational History  . Not on file  Social Needs  . Financial resource strain: Not on file  . Food insecurity    Worry: Not on file    Inability: Not on file  . Transportation needs    Medical: Not on file    Non-medical: Not on file  Tobacco Use  . Smoking status: Current Every Day Smoker    Packs/day: 0.25    Types: Cigarettes  . Smokeless tobacco: Never Used  Substance and Sexual Activity  . Alcohol use: Yes    Comment: occ  . Drug use: Never  .  Sexual activity: Not on file  Lifestyle  . Physical activity    Days per week: Not on file    Minutes per session: Not on file  . Stress: Not on file  Relationships  . Social Herbalist on phone: Not on file    Gets together: Not on file    Attends religious service: Not on file    Active member of club or organization: Not on file    Attends meetings of clubs or organizations: Not on file    Relationship status: Not on file  Other Topics Concern  . Not on file  Social History Narrative  . Not on file   Past Surgical History:  Procedure Laterality Date  . AMPUTATION Left 12/04/2018   Procedure: LEFT BELOW KNEE AMPUTATION;  Surgeon: Angelia Mould, MD;  Location: [place];  Service: Vascular;  Laterality: Left;  . AORTOGRAM Left 12/01/2018   Procedure: Abdominal Aortogram with left leg runoff;  Surgeon: Serafina Mitchell, MD;  Location: [place];  Service: Vascular;  Laterality: Left;  . FEMORAL-TIBIAL BYPASS GRAFT Left 11/07/2018   Procedure: THROMBECTOMY POPLITEAL, ANTERIOR TIBIAL ARTERY, POSTERIOR TIBIAL ARTERY, AND VEIN PATCH ANTERIOR ARTERY;  Surgeon: Serafina Mitchell, MD;  Location: [place];  Service: Vascular;  Laterality: Left;  . LOWER EXTREMITY ANGIOGRAM  Left 12/02/2018   Procedure: Left Lower Extremity Angiogram;  Surgeon: Nada LibmanBrabham, Vance W, MD;  [facility];  Service: Vascular;  Laterality: Left;  . LOWER EXTREMITY ANGIOGRAPHY Bilateral 11/06/2018   Procedure: LOWER EXTREMITY ANGIOGRAPHY;  Surgeon: Nada LibmanBrabham, Vance W, MD;  [facility];  Service: Cardiovascular;  Laterality: Bilateral;  And ABD   . PATCH ANGIOPLASTY Left 12/01/2018   Procedure: Patch Angioplasty with xenosure Biological patch on Left tibia fibula trunk;  Surgeon: Nada LibmanBrabham, Vance W, MD;  [facility];  Service: Vascular;  Laterality: Left;  . TEE WITHOUT CARDIOVERSION  12/01/2018   Procedure: Transesophageal Echocardiogram (Tee);  Surgeon: Nada LibmanBrabham, Vance W, MD;  [facility];  Service:  Vascular;;  . THROMBECTOMY FEMORAL ARTERY Left 12/01/2018   Procedure: Thrombectomy left leg, SFA, Popliteal artery, posterior tibial artery, and anterior itbial artery, Redo popliteal exposure, posterior tibia exposure;  Surgeon: Nada LibmanBrabham, Vance W, MD;  [facility];  Service: Vascular;  Laterality: Left;  . THROMBECTOMY FEMORAL ARTERY Left 12/02/2018   Procedure: Thrombectomy left leg, SFA, Popliteal artery, posterior tibial artery, and anterior tibial artery, Redo popliteal exposure, posterior tibia exposure, Patch Angioplasty with xenosure Biological patch on Left tibia fibula trunk, Abdominal Aortogram with left leg runoff;  Surgeon: Nada LibmanBrabham, Vance W, MD;  [facility];  Service: Vascular;  Laterality: Left;  . THROMBECTOMY FEMORAL ARTERY  12/02/2018   Procedure: Thrombectomy of left SFA, popliteal artery, posterior tibial artery, and anterior tibial artery;  Surgeon: Nada LibmanBrabham, Vance W, MD;  [facility];  Service: Vascular;;   Past Medical History:  Diagnosis Date  . Pregnancy induced hypertension    BP (!) 156/91   Pulse 91   Temp 98.6 F (37 C)   SpO2 97%   Opioid Risk Score:   Fall Risk Score:  `1  Depression screen PHQ 2/9  Depression screen PHQ 2/9 01/24/2019  Decreased Interest 0  Down, Depressed, Hopeless 0  PHQ - 2 Score 0   Review of Systems  Respiratory: Positive for shortness of breath.   Musculoskeletal: Positive for arthralgias and myalgias.  Neurological: Positive for weakness and numbness.  Psychiatric/Behavioral: The patient is nervous/anxious.       Objective:   Physical Exam Constitutional: NAD. Vital signs reviewed. HENT: Normocephalic. Atraumatic.  Eyes: EOMI. No discharge. Cardiovascular: No JVD. Respiratory: Normal effort. GI: Non-distended. Musc: Left BKA with edema Neurological: Alert  Motor:  Right lower extremity: 5/5 proximal distal Left lower extremity: Hip flexion, knee extension: 5/5  Skin: BKA with ulcer along lateral side, deeper  than previously, ~1cm Psychiatric: Normal mood and affect.    Assessment & Plan:  Right-handed female with a long history of peripheral vascular disease present for follow up for left BKA.  1.  Decreased functional mobility secondary to left BKA 12/04/2018 after failed revascularization procedure, now with ulceration  Cont HEP  Released by Vascular   Cont stump shrinker   Avoid lotion over ulceration  Will order iodoform packing strip for packing wound daily - will cont to follow for decrease in size  Will hold off on prosthesis due to ulcer  Will order therapies after prosthesis  2. Phantom limb pain   No benefit with Gabapentin  Will increase Lyrica 100 TID  Encouraged desensitization  3.  Constipation.    Cont Laxative              Stable  4. Gait abnormality  Cont walker/wheelchair for safety   5. HTN  Elevated today, states WNL  normal

## 2019-03-07 NOTE — Progress Notes (Addendum)
   CC: L BKA f/u  HPI:  Ms.Debra Ball is a R handed 43 y.o. F with a hx of PVD now s/p L BKA on 12/04/18 who presents today for wound care f/u.  The patient's last visit was on 02/21/2019. Gabapentin was stopped and takes Lyrica 50 mg TID. Since her last visit, she says the pain she feels in her leg is about the same. Describes it as a burning shooting pain that is phantom pain. The Lyrica is effective at reducing her pain. The site of the BKA is sore from time to time but overall feels ok.   Of note, the patient says she has been taking a half tablet of oxycodone from one of her family members at night to help her sleep at night and that has been very effective. Gabapentin has been used in the past and doesn't get any benefit.  Past Medical History:  Diagnosis Date  . Pregnancy induced hypertension    Review of Systems:  Reviewed and are otherwise negative unless mentioned in the HPI.  Physical Exam:  Vitals:   03/07/19 0952  BP: (!) 156/91  Pulse: 91  Temp: 98.6 F (37 C)  SpO2: 97%   Physical Exam Constitutional:      General: She is not in acute distress.    Appearance: Normal appearance. She is not ill-appearing, toxic-appearing or diaphoretic.  HENT:     Head: Normocephalic and atraumatic.  Eyes:     General: No scleral icterus.       Right eye: No discharge.        Left eye: No discharge.  Cardiovascular:     Comments: Warm and well perfused  Pulmonary:     Effort: Pulmonary effort is normal.  Musculoskeletal: Normal range of motion.        General: Tenderness (mild tenderness to palpation of BKA stump) and deformity (LLE BKA) present.     Comments: LLE: Strength 5/5 with hip flexion, extension, 5/5 knee flexion & extension  Skin:    Comments: 1cm x 1cm healing lesion on the LLE stump. No drainage, surrounding erythema, or tenderness to palpation.   Neurological:     Mental Status: She is alert and oriented to person, place, and time.     Comments:  Burning phantom pain present in LLE    Assessment & Plan:   In summary, Debra Ball R handed 43 y.o. F with a hx of PVD now s/p L BKA on 12/04/18 who presents today for wound care f/u.   #L BKA #Phantom limb pain: Patient notes phantom burning and shooting sensation in her left lower extremity that is improved with Lyrica. - Increase Lyrica to 100 mg TID from 50 mg today.  - Encouraged continud use of desensitizing techniques  - Consider prosthesis once wound heels  #Wound:  1cm x 1cm healing wound still present on the L BKA - Iodoform packing stirp for daily packing - f/u in 2 weeks to reevaluate wound healing  Patient seen with Dr. Posey Pronto    Agree with assesment/plan, wound does not appear to be healing appropriate and dressing changed on this visit.

## 2019-03-21 ENCOUNTER — Encounter: Payer: Medicaid Other | Admitting: Registered Nurse

## 2019-03-26 ENCOUNTER — Other Ambulatory Visit: Payer: Self-pay

## 2019-03-26 ENCOUNTER — Encounter: Payer: Medicaid Other | Attending: Physical Medicine & Rehabilitation | Admitting: Physical Medicine & Rehabilitation

## 2019-03-26 ENCOUNTER — Encounter: Payer: Self-pay | Admitting: Physical Medicine & Rehabilitation

## 2019-03-26 ENCOUNTER — Encounter: Payer: Medicaid Other | Admitting: Registered Nurse

## 2019-03-26 VITALS — BP 150/94 | HR 84 | Temp 98.2°F | Ht 61.0 in | Wt 178.0 lb

## 2019-03-26 DIAGNOSIS — R269 Unspecified abnormalities of gait and mobility: Secondary | ICD-10-CM | POA: Diagnosis not present

## 2019-03-26 DIAGNOSIS — T8189XS Other complications of procedures, not elsewhere classified, sequela: Secondary | ICD-10-CM

## 2019-03-26 DIAGNOSIS — L98492 Non-pressure chronic ulcer of skin of other sites with fat layer exposed: Secondary | ICD-10-CM

## 2019-03-26 DIAGNOSIS — I10 Essential (primary) hypertension: Secondary | ICD-10-CM | POA: Insufficient documentation

## 2019-03-26 DIAGNOSIS — Z720 Tobacco use: Secondary | ICD-10-CM

## 2019-03-26 DIAGNOSIS — G546 Phantom limb syndrome with pain: Secondary | ICD-10-CM

## 2019-03-26 DIAGNOSIS — Z89512 Acquired absence of left leg below knee: Secondary | ICD-10-CM | POA: Diagnosis not present

## 2019-03-26 NOTE — Progress Notes (Signed)
Subjective:    Patient ID: Debra Ball, female    DOB: [birth year], 43 y.o.   MRN: [id number]  HPI Right-handed female with a long history of peripheral vascular disease present for follow up for left BKA.  Last clinic visit on 03/07/2019.  Since last visit, she continues to use stump shrinker.  She has been using iodoform packing.  She has benefit with increase in Lyrica. Bowel movements have improved. Denies falls. BP remains WNL.  Pain Inventory Average Pain 7 Pain Right Now 7 My pain is sharp, burning, tingling and aching  In the last 24 hours, has pain interfered with the following? General activity 7 Relation with others 7 Enjoyment of life 7 What TIME of day is your pain at its worst? all Sleep (in general) Poor  Pain is worse with: bending and unsure Pain improves with: rest and medication Relief from Meds: 8  Mobility walk without assistance use a walker do you drive?  no use a wheelchair transfers alone  Function not employed: date last employed . I need assistance with the following:  dressing, meal prep, household duties and shopping  Neuro/Psych weakness numbness tingling anxiety  Prior Studies Any changes since last visit?  no  Physicians involved in your care Any changes since last visit?  no   No family history on file. Social History   Socioeconomic History  . Marital status: Married    Spouse name: Not on file  . Number of children: Not on file  . Years of education: Not on file  . Highest education level: Not on file  Occupational History  . Not on file  Social Needs  . Financial resource strain: Not on file  . Food insecurity    Worry: Not on file    Inability: Not on file  . Transportation needs    Medical: Not on file    Non-medical: Not on file  Tobacco Use  . Smoking status: Current Every Day Smoker    Packs/day: 0.25    Types: Cigarettes  . Smokeless tobacco: Never Used  Substance and Sexual Activity  .  Alcohol use: Yes    Comment: occ  . Drug use: Never  . Sexual activity: Not on file  Lifestyle  . Physical activity    Days per week: Not on file    Minutes per session: Not on file  . Stress: Not on file  Relationships  . Social Musicianconnections    Talks on phone: Not on file    Gets together: Not on file    Attends religious service: Not on file    Active member of club or organization: Not on file    Attends meetings of clubs or organizations: Not on file    Relationship status: Not on file  Other Topics Concern  . Not on file  Social History Narrative  . Not on file   Past Surgical History:  Procedure Laterality Date  . AMPUTATION Left 12/04/2018   Procedure: LEFT BELOW KNEE AMPUTATION;  Surgeon: Chuck Hintickson, Christopher S, MD;  [facility];  Service: Vascular;  Laterality: Left;  . AORTOGRAM Left 12/01/2018   Procedure: Abdominal Aortogram with left leg runoff;  Surgeon: Nada LibmanBrabham, Vance W, MD;  [facility];  Service: Vascular;  Laterality: Left;  . FEMORAL-TIBIAL BYPASS GRAFT Left 11/07/2018   Procedure: THROMBECTOMY POPLITEAL, ANTERIOR TIBIAL ARTERY, POSTERIOR TIBIAL ARTERY, AND VEIN PATCH ANTERIOR ARTERY;  Surgeon: Nada LibmanBrabham, Vance W, MD;  [facility];  Service: Vascular;  Laterality: Left;  . LOWER EXTREMITY ANGIOGRAM Left 12/02/2018   Procedure: Left Lower Extremity Angiogram;  Surgeon: Nada Libman, MD;  Location: [facility] OR;  Service: Vascular;  Laterality: Left;  . LOWER EXTREMITY ANGIOGRAPHY Bilateral 11/06/2018   Procedure: LOWER EXTREMITY ANGIOGRAPHY;  Surgeon: Nada Libman, MD;  [facility];  Service: Cardiovascular;  Laterality: Bilateral;  And ABD   . PATCH ANGIOPLASTY Left 12/01/2018   Procedure: Patch Angioplasty with xenosure Biological patch on Left tibia fibula trunk;  Surgeon: Nada Libman, MD;  Location: [facility] OR;  Service: Vascular;  Laterality: Left;  . TEE WITHOUT CARDIOVERSION  12/01/2018   Procedure: Transesophageal Echocardiogram (Tee);   Surgeon: Nada Libman, MD;  Location: [facility] OR;  Service: Vascular;;  . THROMBECTOMY FEMORAL ARTERY Left 12/01/2018   Procedure: Thrombectomy left leg, SFA, Popliteal artery, posterior tibial artery, and anterior itbial artery, Redo popliteal exposure, posterior tibia exposure;  Surgeon: Nada Libman, MD;  [facility];  Service: Vascular;  Laterality: Left;  . THROMBECTOMY FEMORAL ARTERY Left 12/02/2018   Procedure: Thrombectomy left leg, SFA, Popliteal artery, posterior tibial artery, and anterior tibial artery, Redo popliteal exposure, posterior tibia exposure, Patch Angioplasty with xenosure Biological patch on Left tibia fibula trunk, Abdominal Aortogram with left leg runoff;  Surgeon: Nada Libman, MD;  [facility];  Service: Vascular;  Laterality: Left;  . THROMBECTOMY FEMORAL ARTERY  12/02/2018   Procedure: Thrombectomy of left SFA, popliteal artery, posterior tibial artery, and anterior tibial artery;  Surgeon: Nada Libman, MD;  [facility];  Service: Vascular;;   Past Medical History:  Diagnosis Date  . Pregnancy induced hypertension    BP (!) 150/94   Pulse 84   Temp 98.2 F (36.8 C)   Ht 5\' 1"  (1.549 m)   Wt 178 lb (80.7 kg)   SpO2 98%   BMI 33.63 kg/m   Opioid Risk Score:   Fall Risk Score:  `1  Depression screen PHQ 2/9  Depression screen PHQ 2/9 01/24/2019  Decreased Interest 0  Down, Depressed, Hopeless 0  PHQ - 2 Score 0   Review of Systems  Respiratory: Positive for shortness of breath.   Musculoskeletal: Positive for arthralgias, gait problem and myalgias.  Neurological: Positive for weakness and numbness.  Psychiatric/Behavioral: The patient is nervous/anxious.       Objective:   Physical Exam Constitutional: NAD. Vital signs reviewed. HENT: Normocephalic. Atraumatic.  Eyes: EOMI. No discharge. Cardiovascular: No JVD. Respiratory: Normal effort. GI: Non-distended. Musc: Left BKA with edema Neurological: Alert Motor:  Right lower  extremity: 5/5 proximal distal Left lower extremity: Hip flexion, knee extension: 5/5  Skin: BKA with ulcer along lateral side, stable ~1cm deep Psychiatric: Normal mood and affect.    Assessment & Plan:  Right-handed female with a long history of peripheral vascular disease present for follow up for left BKA.  1.  Decreased functional mobility secondary to left BKA 12/04/2018 after failed revascularization procedure, now with ulceration  Cont HEP  Released by Vascular   Cont stump shrinker   Avoid lotion over ulceration  Cont iodoform packing strip for packing wound, increased to BID  Referral to wound care - stable in size  Will hold off on prosthesis due to ulcer  Will order therapies after prosthesis  Stressed importance of smoking cessation   2. Phantom limb pain   No benefit with Gabapentin  Cont Lyrica 100 TID  Encouraged desensitization  3.  Constipation.    Cont  Laxative              Stable  4. Gait abnormality  Cont walker/wheelchair for safety   5. HTN  Elevated today, encouraged ambulatory monitoring  6. Tobacco abuse  1/2 PPD - educated on wound healing  Patient appears reluctant to stop smoking, states she will follow up with PCP

## 2019-04-30 ENCOUNTER — Encounter

## 2019-04-30 ENCOUNTER — Encounter: Payer: Self-pay | Admitting: Physical Medicine & Rehabilitation

## 2019-04-30 ENCOUNTER — Other Ambulatory Visit: Payer: Self-pay

## 2019-04-30 ENCOUNTER — Encounter: Payer: Medicaid Other | Attending: Physical Medicine & Rehabilitation | Admitting: Physical Medicine & Rehabilitation

## 2019-04-30 VITALS — BP 136/81 | HR 91 | Temp 98.0°F | Ht 61.0 in | Wt 178.0 lb

## 2019-04-30 DIAGNOSIS — L98492 Non-pressure chronic ulcer of skin of other sites with fat layer exposed: Secondary | ICD-10-CM

## 2019-04-30 DIAGNOSIS — I10 Essential (primary) hypertension: Secondary | ICD-10-CM | POA: Insufficient documentation

## 2019-04-30 DIAGNOSIS — Z89512 Acquired absence of left leg below knee: Secondary | ICD-10-CM

## 2019-04-30 DIAGNOSIS — R269 Unspecified abnormalities of gait and mobility: Secondary | ICD-10-CM

## 2019-04-30 DIAGNOSIS — G546 Phantom limb syndrome with pain: Secondary | ICD-10-CM

## 2019-04-30 DIAGNOSIS — T8189XS Other complications of procedures, not elsewhere classified, sequela: Secondary | ICD-10-CM

## 2019-04-30 DIAGNOSIS — Z720 Tobacco use: Secondary | ICD-10-CM

## 2019-04-30 NOTE — Progress Notes (Signed)
Subjective:    Patient ID: Debra Ball, female    DOB: [birth year], 43 y.o.   MRN: [id number]  HPI Right-handed female with a long history of peripheral vascular disease present for follow up for left BKA.  Last clinic visit on 03/26/2019.  Since last visit, patient states she did not follow up with wound care.  She does note improvement in wound size.  She is doing HEP. She continues to smoke, 1/2 PPD, she did not follow up PCP. She is doing well with Lyrica.  Bowel movements have improved. Denies falls. BP is better controlled.  Pain Inventory Average Pain 5 Pain Right Now 5 My pain is tingling and aching  In the last 24 hours, has pain interfered with the following? General activity 5 Relation with others 5 Enjoyment of life 5 What TIME of day is your pain at its worst? all Sleep (in general) Fair  Pain is worse with: bending, sitting and standing Pain improves with: rest and medication Relief from Meds: 5  Mobility walk without assistance use a walker how many minutes can you walk? 10 with walker do you drive?  no use a wheelchair needs help with transfers  Function I need assistance with the following:  meal prep, household duties and shopping  Neuro/Psych numbness tingling anxiety  Prior Studies Any changes since last visit?  no  Physicians involved in your care Any changes since last visit?  no   No family history on file. Social History   Socioeconomic History  . Marital status: Married    Spouse name: Not on file  . Number of children: Not on file  . Years of education: Not on file  . Highest education level: Not on file  Occupational History  . Not on file  Social Needs  . Financial resource strain: Not on file  . Food insecurity    Worry: Not on file    Inability: Not on file  . Transportation needs    Medical: Not on file    Non-medical: Not on file  Tobacco Use  . Smoking status: Current Every Day Smoker    Packs/day: 0.25    Types: Cigarettes  . Smokeless tobacco: Never Used  Substance and Sexual Activity  . Alcohol use: Yes    Comment: occ  . Drug use: Never  . Sexual activity: Not on file  Lifestyle  . Physical activity    Days per week: Not on file    Minutes per session: Not on file  . Stress: Not on file  Relationships  . Social Herbalist on phone: Not on file    Gets together: Not on file    Attends religious service: Not on file    Active member of club or organization: Not on file    Attends meetings of clubs or organizations: Not on file    Relationship status: Not on file  Other Topics Concern  . Not on file  Social History Narrative  . Not on file   Past Surgical History:  Procedure Laterality Date  . AMPUTATION Left 12/04/2018   Procedure: LEFT BELOW KNEE AMPUTATION;  Surgeon: Angelia Mould, MD;  Location: [place];  Service: Vascular;  Laterality: Left;  . AORTOGRAM Left 12/01/2018   Procedure: Abdominal Aortogram with left leg runoff;  Surgeon: Serafina Mitchell, MD;  Location: [place];  Service: Vascular;  Laterality: Left;  . FEMORAL-TIBIAL BYPASS GRAFT Left 11/07/2018   Procedure: THROMBECTOMY POPLITEAL, ANTERIOR TIBIAL  ARTERY, POSTERIOR TIBIAL ARTERY, AND VEIN PATCH ANTERIOR ARTERY;  Surgeon: Nada LibmanBrabham, Vance W, MD;  [facility];  Service: Vascular;  Laterality: Left;  . LOWER EXTREMITY ANGIOGRAM Left 12/02/2018   Procedure: Left Lower Extremity Angiogram;  Surgeon: Nada LibmanBrabham, Vance W, MD;  [facility];  Service: Vascular;  Laterality: Left;  . LOWER EXTREMITY ANGIOGRAPHY Bilateral 11/06/2018   Procedure: LOWER EXTREMITY ANGIOGRAPHY;  Surgeon: Nada LibmanBrabham, Vance W, MD;  [facility]: [facility];  Service: Cardiovascular;  Laterality: Bilateral;  And ABD   . PATCH ANGIOPLASTY Left 12/01/2018   Procedure: Patch Angioplasty with xenosure Biological patch on Left tibia fibula trunk;  Surgeon: Nada LibmanBrabham, Vance W, MD;  [facility];  Service: Vascular;  Laterality: Left;  .  TEE WITHOUT CARDIOVERSION  12/01/2018   Procedure: Transesophageal Echocardiogram (Tee);  Surgeon: Nada LibmanBrabham, Vance W, MD;  [facility];  Service: Vascular;;  . THROMBECTOMY FEMORAL ARTERY Left 12/01/2018   Procedure: Thrombectomy left leg, SFA, Popliteal artery, posterior tibial artery, and anterior itbial artery, Redo popliteal exposure, posterior tibia exposure;  Surgeon: Nada LibmanBrabham, Vance W, MD;  [facility];  Service: Vascular;  Laterality: Left;  . THROMBECTOMY FEMORAL ARTERY Left 12/02/2018   Procedure: Thrombectomy left leg, SFA, Popliteal artery, posterior tibial artery, and anterior tibial artery, Redo popliteal exposure, posterior tibia exposure, Patch Angioplasty with xenosure Biological patch on Left tibia fibula trunk, Abdominal Aortogram with left leg runoff;  Surgeon: Nada LibmanBrabham, Vance W, MD;  [facility];  Service: Vascular;  Laterality: Left;  . THROMBECTOMY FEMORAL ARTERY  12/02/2018   Procedure: Thrombectomy of left SFA, popliteal artery, posterior tibial artery, and anterior tibial artery;  Surgeon: Nada LibmanBrabham, Vance W, MD;  [facility];  Service: Vascular;;   Past Medical History:  Diagnosis Date  . Pregnancy induced hypertension    BP 136/81   Pulse 91   Temp 98 F (36.7 C)   Ht 5\' 1"  (1.549 m) Comment: last recorded  Wt 178 lb (80.7 kg) Comment: last recorded, unable to stand  SpO2 96%   BMI 33.63 kg/m   Opioid Risk Score:   Fall Risk Score:  `1  Depression screen PHQ 2/9  [facility] 04/30/2019 01/24/2019  Decreased Interest 1 0  Down, Depressed, Hopeless 1 0  PHQ - 2 Score 2 0   Review of Systems  Constitutional: Negative.   HENT: Negative.   Eyes: Negative.   Respiratory: Negative.   Cardiovascular: Negative.   Gastrointestinal: Negative.   Endocrine: Negative.   Genitourinary: Negative.   Musculoskeletal: Positive for arthralgias, gait problem and myalgias.  Neurological: Positive for numbness.       Tingling  Hematological: Negative.    Psychiatric/Behavioral: The patient is nervous/anxious.       Objective:   Physical Exam Constitutional: NAD. Vital signs reviewed. HENT: Normocephalic. Atraumatic.  Eyes: EOMI. No discharge. Cardiovascular: No JVD. Respiratory: Normal effort. GI: Non-distended. Musc: Left BKA with edema Neurological: Alert. Motor:  Right lower extremity: 5/5 proximal distal Left lower extremity: Hip flexion, knee extension: 5/5  Skin: BKA with ulcer along lateral side, improving with small opening Psychiatric: Normal mood and affect.    Assessment & Plan:  Right-handed female with a long history of peripheral vascular disease present for follow up for left BKA.  1.  Decreased functional mobility secondary to left BKA 12/04/2018 after failed revascularization procedure, now with ulceration  Cont HEP  Released by Vascular   Cont stump shrinker   Avoid lotion over ulceration  Cont  iodoform packing strip BID, discussed importance of packing to avoid superficial closure  Will hold off on prosthesis due to ulcer  Will order therapies after prosthesis  Stressed importance of smoking cessation again  2. Phantom limb pain   No benefit with Gabapentin  Cont Lyrica 100 TID  Encouraged desensitization  3.  Constipation.    Cont Laxative              Stable  4. Gait abnormality  Cont walker/wheelchair for safety   5. Tobacco abuse  1/2 PPD still - educated on wound healing  Patient appears reluctant to stop smoking, states she will follow up with PCP (reminded again)

## 2019-05-08 ENCOUNTER — Other Ambulatory Visit: Payer: Self-pay | Admitting: Physical Medicine & Rehabilitation

## 2019-05-28 ENCOUNTER — Encounter: Payer: Medicaid Other | Attending: Physical Medicine & Rehabilitation | Admitting: Physical Medicine & Rehabilitation

## 2019-05-28 ENCOUNTER — Encounter: Payer: Self-pay | Admitting: Physical Medicine & Rehabilitation

## 2019-05-28 ENCOUNTER — Other Ambulatory Visit: Payer: Self-pay

## 2019-05-28 VITALS — BP 156/87 | HR 102 | Temp 97.7°F | Ht 61.5 in

## 2019-05-28 DIAGNOSIS — Z89512 Acquired absence of left leg below knee: Secondary | ICD-10-CM

## 2019-05-28 DIAGNOSIS — R269 Unspecified abnormalities of gait and mobility: Secondary | ICD-10-CM | POA: Diagnosis not present

## 2019-05-28 DIAGNOSIS — G546 Phantom limb syndrome with pain: Secondary | ICD-10-CM | POA: Insufficient documentation

## 2019-05-28 DIAGNOSIS — Z720 Tobacco use: Secondary | ICD-10-CM

## 2019-05-28 DIAGNOSIS — T8189XS Other complications of procedures, not elsewhere classified, sequela: Secondary | ICD-10-CM

## 2019-05-28 DIAGNOSIS — L98492 Non-pressure chronic ulcer of skin of other sites with fat layer exposed: Secondary | ICD-10-CM | POA: Diagnosis not present

## 2019-05-28 DIAGNOSIS — I10 Essential (primary) hypertension: Secondary | ICD-10-CM | POA: Diagnosis present

## 2019-05-28 DIAGNOSIS — T8189XA Other complications of procedures, not elsewhere classified, initial encounter: Secondary | ICD-10-CM | POA: Insufficient documentation

## 2019-05-28 NOTE — Progress Notes (Signed)
Subjective:    Patient ID: Debra Ball, female    DOB: Nov 28, 1975, 43 y.o.   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  Last clinic visit on 04/30/2019.  Since that time, she patient was admitted to the hospital for multiple blood clots. She was also diagnosed with DM. She is doing HEP. Ulcer has still not healed, but she has not been packing it. She continues to take Lyrica. She is smoking, but is planning on quitting now and going to see PCP for a patch. Bowel movements have normalized.  Denies falls.  Pain Inventory Average Pain 7 Pain Right Now 7 My pain is stabbing, tingling and aching  In the last 24 hours, has pain interfered with the following? General activity 5 Relation with others 5 Enjoyment of life 5 What TIME of day is your pain at its worst? all Sleep (in general) Poor  Pain is worse with: bending, sitting and standing Pain improves with: rest and medication Relief from Meds: 5  Mobility walk without assistance use a walker how many minutes can you walk? 10 with walker do you drive?  no use a wheelchair needs help with transfers  Function I need assistance with the following:  meal prep, household duties and shopping  Neuro/Psych numbness tingling anxiety  Prior Studies Any changes since last visit?  no  Physicians involved in your care Any changes since last visit?  no   No family history on file. Social History   Socioeconomic History   Marital status: Married    Spouse name: Not on file   Number of children: Not on file   Years of education: Not on file   Highest education level: Not on file  Occupational History   Not on file  Social Needs   Financial resource strain: Not on file   Food insecurity    Worry: Not on file    Inability: Not on file   Transportation needs    Medical: Not on file    Non-medical: Not on file  Tobacco  Use   Smoking status: Current Every Day Smoker    Packs/day: 0.25    Types: Cigarettes   Smokeless tobacco: Never Used  Substance and Sexual Activity   Alcohol use: Yes    Comment: occ   Drug use: Never   Sexual activity: Not on file  Lifestyle   Physical activity    Days per week: Not on file    Minutes per session: Not on file   Stress: Not on file  Relationships   Social connections    Talks on phone: Not on file    Gets together: Not on file    Attends religious service: Not on file    Active member of club or organization: Not on file    Attends meetings of clubs or organizations: Not on file    Relationship status: Not on file  Other Topics Concern   Not on file  Social History Narrative   Not on file   Past Surgical History:  Procedure Laterality Date   AMPUTATION Left 12/04/2018   Procedure: LEFT BELOW KNEE AMPUTATION;  Surgeon: Chuck Hintickson, Christopher S, MD;  [facility];  Service: Vascular;  Laterality: Left;   AORTOGRAM Left 12/01/2018   Procedure: Abdominal Aortogram with left leg runoff;  Surgeon: Nada LibmanBrabham, Vance W, MD;  [facility];  Service: Vascular;  Laterality: Left;  FEMORAL-TIBIAL BYPASS GRAFT Left 11/07/2018   Procedure: THROMBECTOMY POPLITEAL, ANTERIOR TIBIAL ARTERY, POSTERIOR TIBIAL ARTERY, AND VEIN PATCH ANTERIOR ARTERY;  Surgeon: Serafina Mitchell, MD;  Location: [place];  Service: Vascular;  Laterality: Left;   LOWER EXTREMITY ANGIOGRAM Left 12/02/2018   Procedure: Left Lower Extremity Angiogram;  Surgeon: Serafina Mitchell, MD;  [facility];  Service: Vascular;  Laterality: Left;   LOWER EXTREMITY ANGIOGRAPHY Bilateral 11/06/2018   Procedure: LOWER EXTREMITY ANGIOGRAPHY;  Surgeon: Serafina Mitchell, MD;  Location: [place] CV LAB;  Service: Cardiovascular;  Laterality: Bilateral;  And ABD    PATCH ANGIOPLASTY Left 12/01/2018   Procedure: Patch Angioplasty with xenosure Biological patch on Left tibia fibula trunk;  Surgeon: Serafina Mitchell, MD;  Location: [place];  Service: Vascular;  Laterality: Left;   TEE WITHOUT CARDIOVERSION  12/01/2018   Procedure: Transesophageal Echocardiogram (Tee);  Surgeon: Serafina Mitchell, MD;  Location: [facility] OR;  Service: Vascular;;   THROMBECTOMY FEMORAL ARTERY Left 12/01/2018   Procedure: Thrombectomy left leg, SFA, Popliteal artery, posterior tibial artery, and anterior itbial artery, Redo popliteal exposure, posterior tibia exposure;  Surgeon: Serafina Mitchell, MD;  Location: [place];  Service: Vascular;  Laterality: Left;   THROMBECTOMY FEMORAL ARTERY Left 12/02/2018   Procedure: Thrombectomy left leg, SFA, Popliteal artery, posterior tibial artery, and anterior tibial artery, Redo popliteal exposure, posterior tibia exposure, Patch Angioplasty with xenosure Biological patch on Left tibia fibula trunk, Abdominal Aortogram with left leg runoff;  Surgeon: Serafina Mitchell, MD;  Location: [place];  Service: Vascular;  Laterality: Left;   THROMBECTOMY FEMORAL ARTERY  12/02/2018   Procedure: Thrombectomy of left SFA, popliteal artery, posterior tibial artery, and anterior tibial artery;  Surgeon: Serafina Mitchell, MD;  Location: [place];  Service: Vascular;;   Past Medical History:  Diagnosis Date   Pregnancy induced hypertension    BP (!) 156/87    Pulse (!) 102    Temp 97.7 F (36.5 C)    Ht 5' 1.5" (1.562 m)    SpO2 97%    BMI 33.09 kg/m   Opioid Risk Score:   Fall Risk Score:  `1  Depression screen PHQ 2/9  Depression screen [facility] 2/9 04/30/2019 01/24/2019  Decreased Interest 1 0  Down, Depressed, Hopeless 1 0  PHQ - 2 Score 2 0   Review of Systems  Constitutional: Negative.   HENT: Negative.   Eyes: Negative.   Respiratory: Negative.   Cardiovascular: Negative.   Gastrointestinal: Negative.   Endocrine: Negative.   Genitourinary: Negative.   Musculoskeletal: Positive for arthralgias, gait problem and myalgias.  Skin:       Nonhealing stump ulcer  Neurological: Positive for numbness.         Tingling  Hematological: Negative.   Psychiatric/Behavioral: Positive for dysphoric mood. The patient is nervous/anxious.       Objective:   Physical Exam  Constitutional: No distress . Vital signs reviewed. HENT: Normocephalic.  Atraumatic. Eyes: EOMI. No discharge. Cardiovascular: No JVD. Respiratory: Normal effort.  No stridor. GI: Non-distended. Skin: Left BKA with ulcer along lateral side larger in size ~1x1x1 cm Psych: Normal mood.  Normal behavior. Musc: left BKA Neurological: Alert Motor:  Right lower extremity: 5/5 proximal distal Left lower extremity: Hip flexion, knee extension: 5/5     Assessment & Plan:  Right-handed female with a long history of peripheral vascular disease present for follow up for left BKA.  1.  Decreased functional mobility secondary to left BKA  12/04/2018 after failed revascularization procedure, now with ulceration  Cont HEP  Released by Vascular   Cont stump shrinker   Avoid lotion over ulceration  Cont iodoform packing strip BID, discussed importance of packing to avoid superficial closure, she has not been packing, educated again.  Will consider wound care referral if no improvement on next visit  Will hold off on prosthesis due to ulcer  Will order therapies after prosthesis  Stressed importance of smoking cessation again for wound healing along with DM management and packing  Discussed aggressive comorbid management for wound healing  2. Phantom limb pain   No benefit with Gabapentin  Cont Lyrica 100 TID  Encouraged desensitization  3.  Constipation.    Cont Laxative              Stable  4. Gait abnormality  Cont walker/wheelchair for safety   5. Tobacco abuse  States she is going to see PCP this time for treatment options  Patient appears reluctant to stop smoking, states she will follow up with PCP (reminded again x3)  6. New DM diagnosis  Encouraged appropriate control

## 2019-06-18 ENCOUNTER — Encounter: Payer: Medicaid Other | Admitting: Physical Medicine & Rehabilitation

## 2019-07-11 ENCOUNTER — Encounter: Payer: Medicaid Other | Admitting: Physical Medicine & Rehabilitation

## 2019-07-30 ENCOUNTER — Other Ambulatory Visit: Payer: Self-pay | Admitting: Physical Medicine & Rehabilitation

## 2019-07-31 NOTE — Telephone Encounter (Signed)
Recieved electronic medication refill request.  Medication can no longer be called into pharmacy, must now be E-scribed.

## 2019-10-07 ENCOUNTER — Other Ambulatory Visit: Payer: Self-pay | Admitting: Physical Medicine & Rehabilitation

## 2020-06-28 IMAGING — CT CT ANGIOGRAPHY CHEST
2 of 7 series · 18 of 46 positions shown · IV contrast (APPLIED)
Comparison: One view chest x-ray 10/24/2018

CLINICAL DATA: Left lower extremity tibial and popliteal
thrombectomy and patch angioplasty. Evaluate for other emboli.

EXAM:
CT ANGIOGRAPHY CHEST WITH CONTRAST
TECHNIQUE: Multidetector CT imaging of the chest was performed using the
standard protocol during bolus administration of intravenous
contrast. Multiplanar CT image reconstructions and MIPs were
obtained to evaluate the vascular anatomy.
CONTRAST:  75mL OMNIPAQUE IOHEXOL 350 MG/ML SOLN

[Series 7: thins · axial · 0.81mm/px · z∈[+1316,+1569]mm · 15 of 408 slices shown]
[im 23/408  lung]
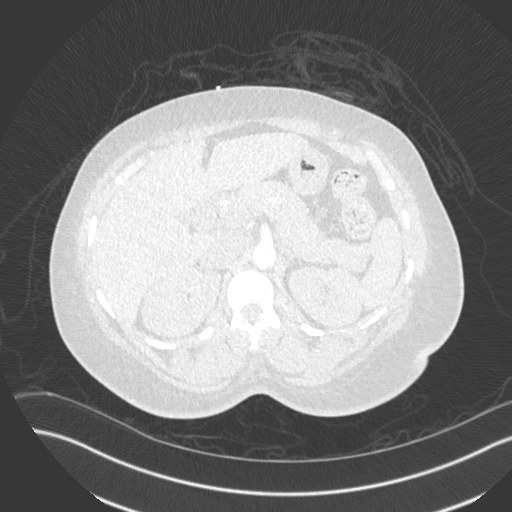
[im 46/408  soft-tissue]
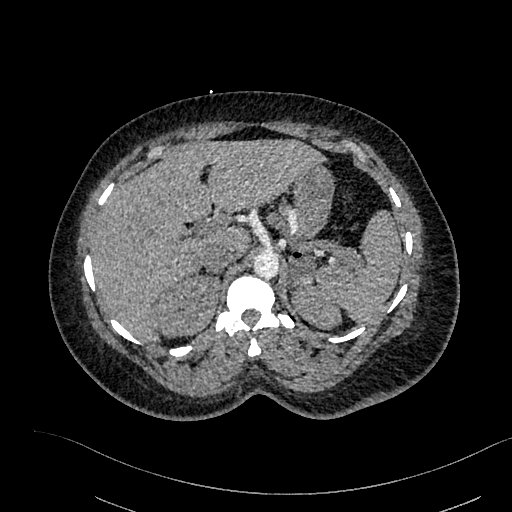
[im 68/408  lung]
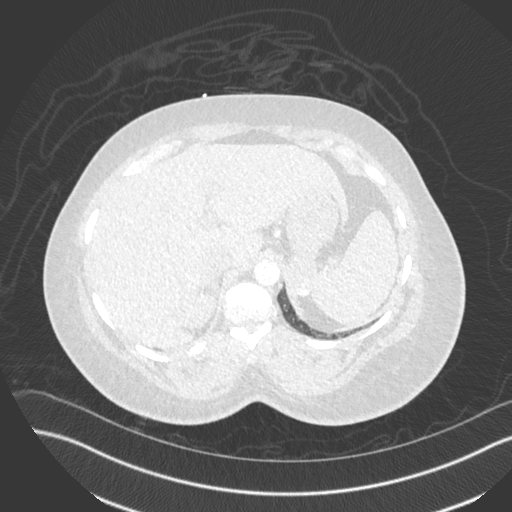
[im 91/408  soft-tissue]
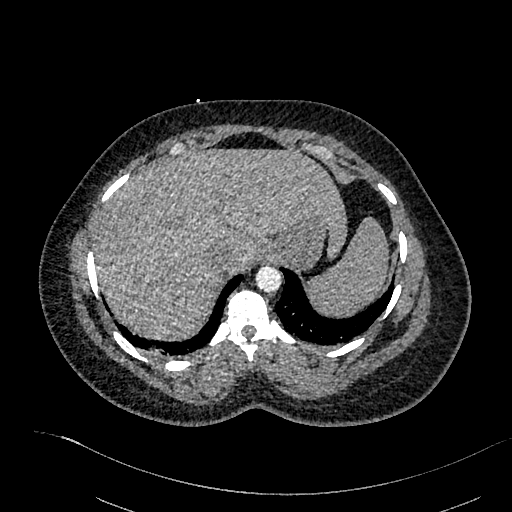
[im 136/408  lung]
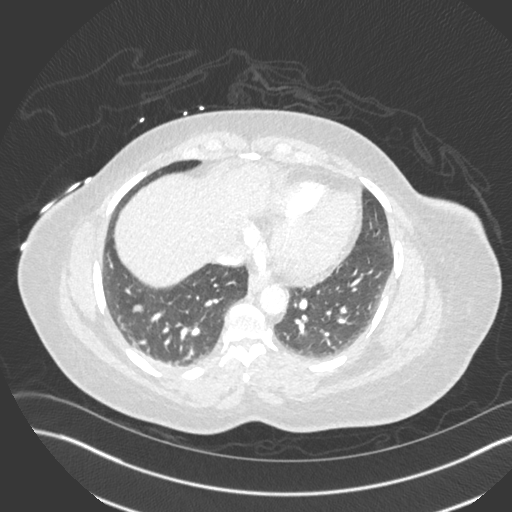
[im 159/408  soft-tissue]
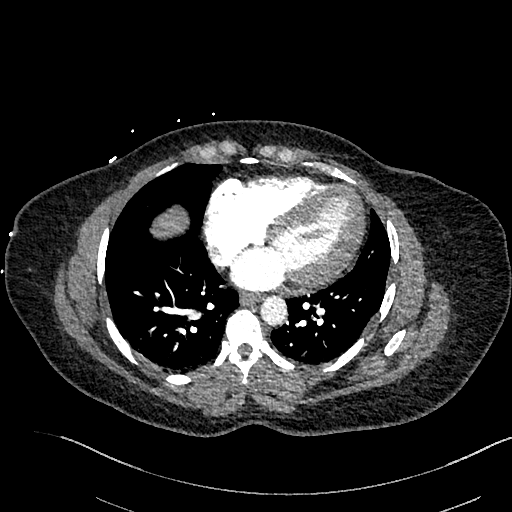
[im 181/408  lung]
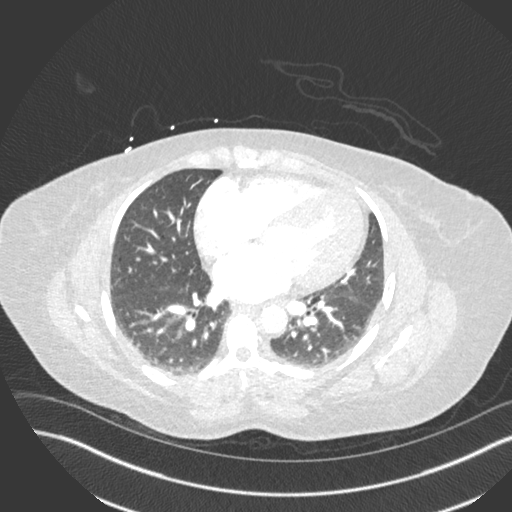
[im 204/408  soft-tissue]
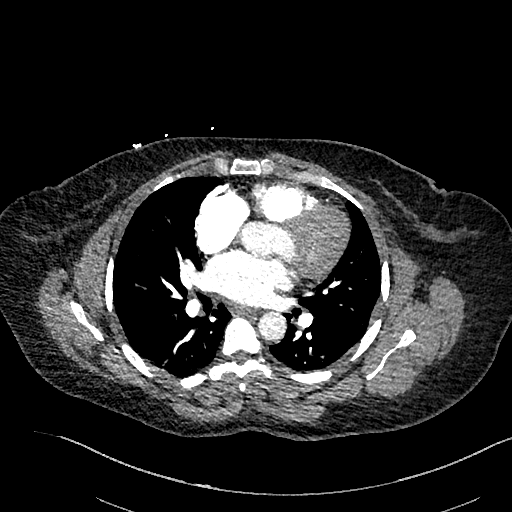
[im 227/408  lung]
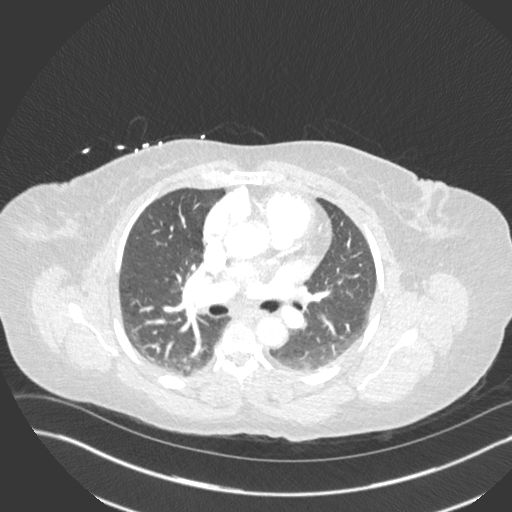
[im 249/408  soft-tissue]
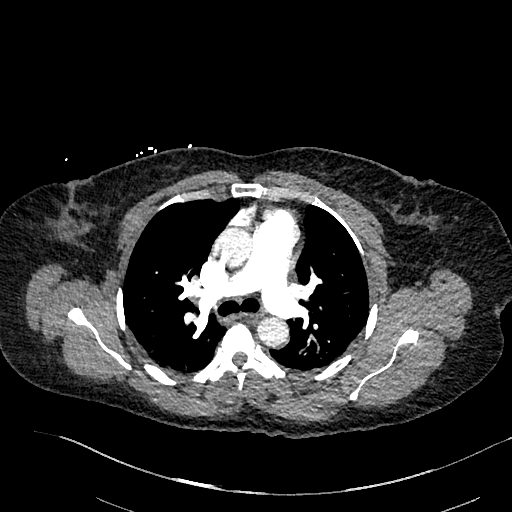
[im 272/408  lung]
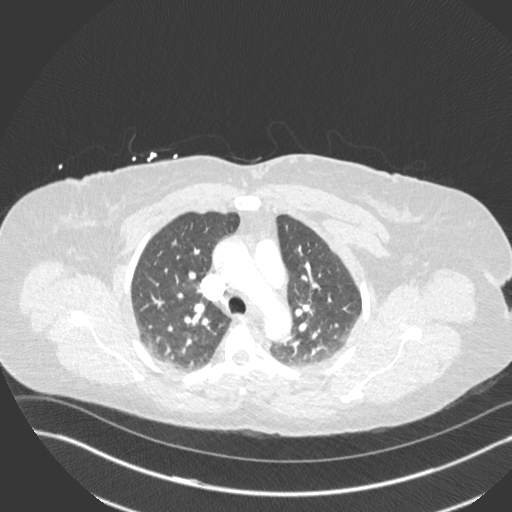
[im 317/408  soft-tissue]
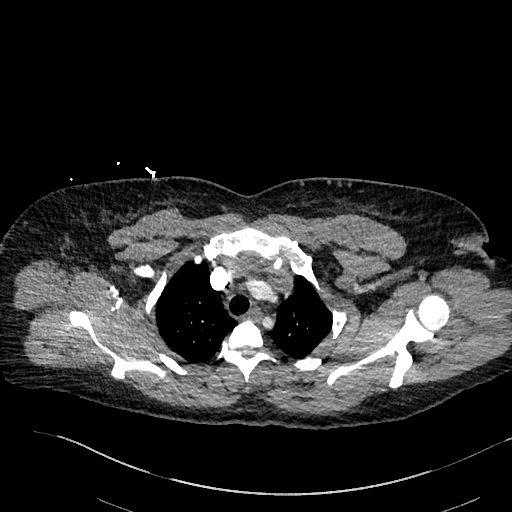
[im 340/408  lung]
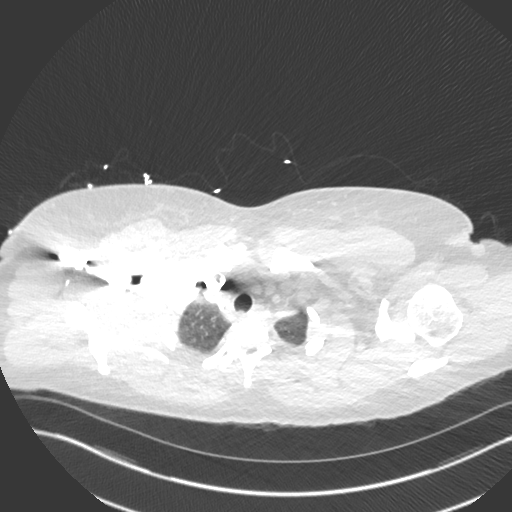
[im 362/408  soft-tissue]
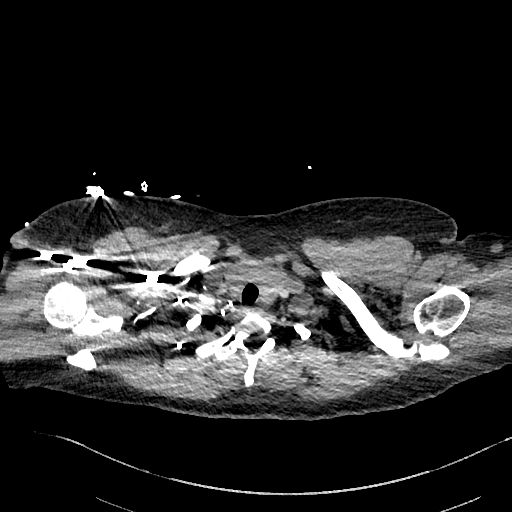
[im 385/408  lung]
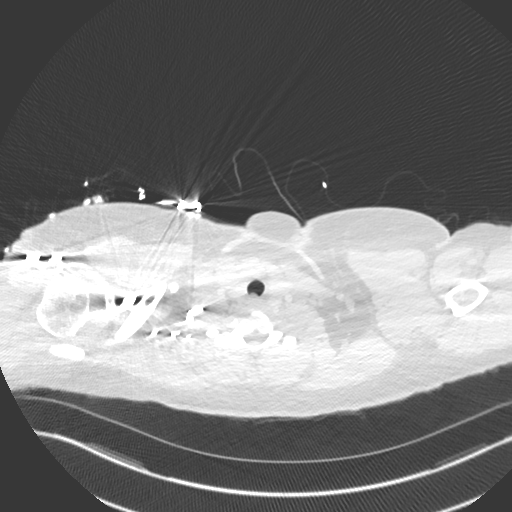

[Series 9: cor · coronal · 0.59mm/px · 3 of 153 slices shown]
[im 39/153  soft-tissue]
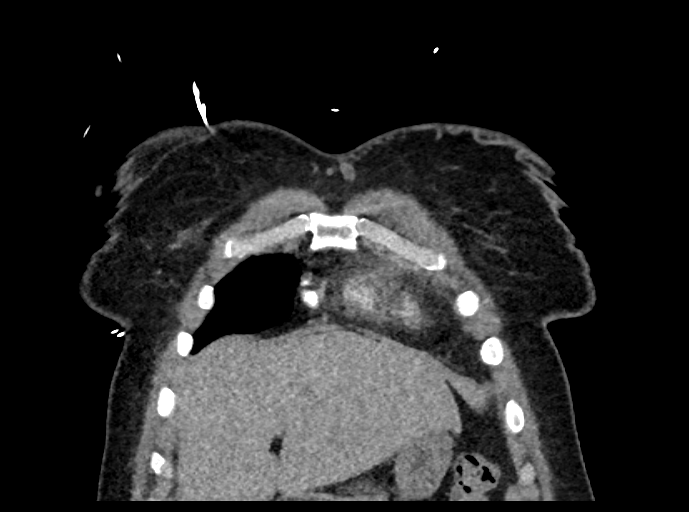
[im 77/153  soft-tissue]
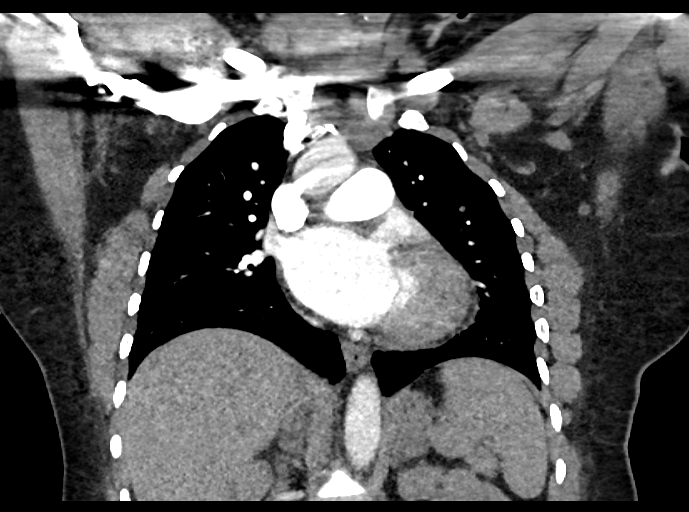
[im 115/153  soft-tissue]
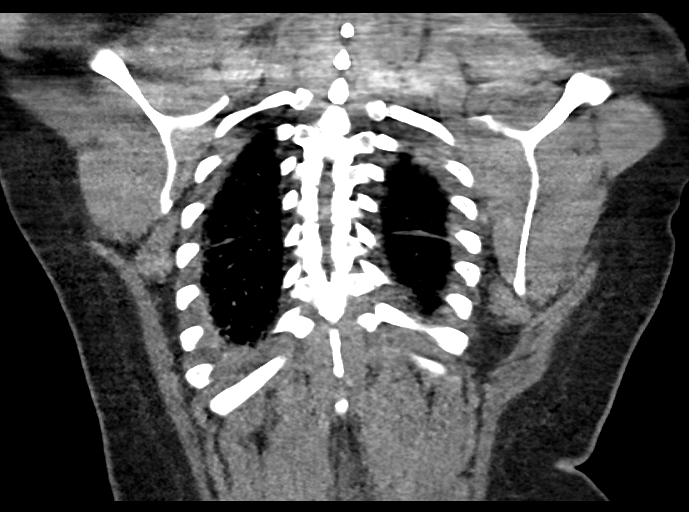

[18 of 46 positions shown; findings below may reference images not displayed]

FINDINGS: Cardiovascular: The heart size is normal. Aorta is within normal
limits. There is no pericardial effusion.

Pulmonary artery opacification is excellent. No focal filling
defects are present to suggest pulmonary embolus.

Mediastinum/Nodes: No enlarged mediastinal, hilar, or axillary lymph
nodes. Thyroid gland, trachea, and esophagus demonstrate no
significant findings.

Lungs/Pleura: Mild dependent atelectasis is present. No focal
nodule, mass, or significant airspace consolidation is present.
There is no effusion or pneumothorax.

Upper Abdomen: Limited imaging of the upper abdomen is unremarkable.

Musculoskeletal: No chest wall abnormality. No acute or significant
osseous findings.

Review of the MIP images confirms the above findings.
IMPRESSION: 1. No pulmonary embolus.
2. Normal CTA of the chest.

## 2021-02-01 ENCOUNTER — Inpatient Hospital Stay
Admission: AD | Admit: 2021-02-01 | Payer: Medicaid Other | Source: Other Acute Inpatient Hospital | Admitting: Cardiovascular Disease

## 2021-02-01 DIAGNOSIS — I249 Acute ischemic heart disease, unspecified: Secondary | ICD-10-CM | POA: Diagnosis not present

## 2021-02-01 DIAGNOSIS — I517 Cardiomegaly: Secondary | ICD-10-CM | POA: Diagnosis not present

## 2022-12-09 ENCOUNTER — Encounter (HOSPITAL_COMMUNITY)
Admission: AD | Disposition: A | Discharge status: Home / Self Care | Payer: Self-pay | Source: Home / Self Care | Attending: Internal Medicine

## 2022-12-09 ENCOUNTER — Other Ambulatory Visit: Payer: Self-pay

## 2022-12-09 ENCOUNTER — Inpatient Hospital Stay (HOSPITAL_COMMUNITY)
Admission: AD | Admit: 2022-12-09 | Discharge: 2022-12-12 | DRG: 857 | Disposition: A | Discharge status: Home / Self Care | Payer: 59 | Attending: Internal Medicine | Admitting: Internal Medicine

## 2022-12-09 ENCOUNTER — Ambulatory Visit (HOSPITAL_COMMUNITY): Payer: 59 | Admitting: Anesthesiology

## 2022-12-09 ENCOUNTER — Encounter (HOSPITAL_COMMUNITY): Payer: Self-pay | Admitting: Orthopedic Surgery

## 2022-12-09 ENCOUNTER — Other Ambulatory Visit: Payer: Self-pay | Admitting: Orthopedic Surgery

## 2022-12-09 DIAGNOSIS — Z86718 Personal history of other venous thrombosis and embolism: Secondary | ICD-10-CM

## 2022-12-09 DIAGNOSIS — F419 Anxiety disorder, unspecified: Secondary | ICD-10-CM | POA: Diagnosis present

## 2022-12-09 DIAGNOSIS — Z7901 Long term (current) use of anticoagulants: Secondary | ICD-10-CM | POA: Diagnosis not present

## 2022-12-09 DIAGNOSIS — F17200 Nicotine dependence, unspecified, uncomplicated: Secondary | ICD-10-CM

## 2022-12-09 DIAGNOSIS — F32A Depression, unspecified: Secondary | ICD-10-CM | POA: Diagnosis present

## 2022-12-09 DIAGNOSIS — Z89512 Acquired absence of left leg below knee: Secondary | ICD-10-CM

## 2022-12-09 DIAGNOSIS — Y838 Other surgical procedures as the cause of abnormal reaction of the patient, or of later complication, without mention of misadventure at the time of the procedure: Secondary | ICD-10-CM | POA: Diagnosis present

## 2022-12-09 DIAGNOSIS — Z794 Long term (current) use of insulin: Secondary | ICD-10-CM

## 2022-12-09 DIAGNOSIS — F17210 Nicotine dependence, cigarettes, uncomplicated: Secondary | ICD-10-CM | POA: Diagnosis present

## 2022-12-09 DIAGNOSIS — T8141XA Infection following a procedure, superficial incisional surgical site, initial encounter: Secondary | ICD-10-CM | POA: Diagnosis present

## 2022-12-09 DIAGNOSIS — B9561 Methicillin susceptible Staphylococcus aureus infection as the cause of diseases classified elsewhere: Secondary | ICD-10-CM | POA: Diagnosis present

## 2022-12-09 DIAGNOSIS — L03113 Cellulitis of right upper limb: Secondary | ICD-10-CM | POA: Diagnosis present

## 2022-12-09 DIAGNOSIS — I10 Essential (primary) hypertension: Secondary | ICD-10-CM

## 2022-12-09 DIAGNOSIS — D649 Anemia, unspecified: Secondary | ICD-10-CM | POA: Diagnosis not present

## 2022-12-09 DIAGNOSIS — I739 Peripheral vascular disease, unspecified: Secondary | ICD-10-CM | POA: Diagnosis present

## 2022-12-09 DIAGNOSIS — T8149XA Infection following a procedure, other surgical site, initial encounter: Secondary | ICD-10-CM

## 2022-12-09 DIAGNOSIS — E1151 Type 2 diabetes mellitus with diabetic peripheral angiopathy without gangrene: Secondary | ICD-10-CM | POA: Diagnosis present

## 2022-12-09 DIAGNOSIS — E1165 Type 2 diabetes mellitus with hyperglycemia: Secondary | ICD-10-CM | POA: Diagnosis present

## 2022-12-09 DIAGNOSIS — D6859 Other primary thrombophilia: Secondary | ICD-10-CM | POA: Diagnosis present

## 2022-12-09 DIAGNOSIS — E119 Type 2 diabetes mellitus without complications: Secondary | ICD-10-CM | POA: Diagnosis not present

## 2022-12-09 DIAGNOSIS — Z7984 Long term (current) use of oral hypoglycemic drugs: Secondary | ICD-10-CM

## 2022-12-09 DIAGNOSIS — I160 Hypertensive urgency: Secondary | ICD-10-CM | POA: Diagnosis present

## 2022-12-09 HISTORY — DX: Anemia, unspecified: D64.9

## 2022-12-09 HISTORY — DX: Depression, unspecified: F32.A

## 2022-12-09 HISTORY — DX: Type 2 diabetes mellitus without complications: E11.9

## 2022-12-09 HISTORY — PX: I & D EXTREMITY: SHX5045

## 2022-12-09 LAB — BASIC METABOLIC PANEL
Anion gap: 12 (ref 5–15)
BUN: 8 mg/dL (ref 6–20)
CO2: 20 mmol/L — ABNORMAL LOW (ref 22–32)
Calcium: 9.4 mg/dL (ref 8.9–10.3)
Chloride: 103 mmol/L (ref 98–111)
Creatinine, Ser: 0.76 mg/dL (ref 0.44–1.00)
GFR, Estimated: >60 mL/min (ref >60)
Glucose, Bld: 190 mg/dL — ABNORMAL HIGH (ref 70–99)
Potassium: 3.9 mmol/L (ref 3.5–5.1)
Sodium: 135 mmol/L (ref 135–145)

## 2022-12-09 LAB — GLUCOSE, CAPILLARY
Glucose-Capillary: 422 mg/dL — ABNORMAL HIGH (ref 70–99)
Glucose-Capillary: 285 mg/dL — ABNORMAL HIGH (ref 70–99)
Glucose-Capillary: 228 mg/dL — ABNORMAL HIGH (ref 70–99)
Glucose-Capillary: 146 mg/dL — ABNORMAL HIGH (ref 70–99)
Glucose-Capillary: 136 mg/dL — ABNORMAL HIGH (ref 70–99)
Glucose-Capillary: 74 mg/dL (ref 70–99)
Glucose-Capillary: 85 mg/dL (ref 70–99)

## 2022-12-09 LAB — CBC
HCT: 48.2 % — ABNORMAL HIGH (ref 36.0–46.0)
Hemoglobin: 17.9 g/dL — ABNORMAL HIGH (ref 12.0–15.0)
MCH: 35.3 pg — ABNORMAL HIGH (ref 26.0–34.0)
MCHC: 37.1 g/dL — ABNORMAL HIGH (ref 30.0–36.0)
MCV: 95.1 fL (ref 80.0–100.0)
Platelets: 251 10*3/uL (ref 150–400)
RBC: 5.07 MIL/uL (ref 3.87–5.11)
RDW: 12.4 % (ref 11.5–15.5)
WBC: 6.6 10*3/uL (ref 4.0–10.5)
nRBC: 0 % (ref 0.0–0.2)

## 2022-12-09 LAB — AEROBIC/ANAEROBIC CULTURE W GRAM STAIN (SURGICAL/DEEP WOUND)

## 2022-12-09 LAB — HCG, SERUM, QUALITATIVE: Preg, Serum: NEGATIVE

## 2022-12-09 SURGERY — IRRIGATION AND DEBRIDEMENT EXTREMITY
Anesthesia: General | Laterality: Right

## 2022-12-09 MED ORDER — DEXTROSE 50 % IV SOLN
25.0000 mL | Freq: Once | INTRAVENOUS | Status: AC
  Administered 2022-12-09: 25 mL via INTRAVENOUS

## 2022-12-09 MED ORDER — VANCOMYCIN HCL 1500 MG/300ML IV SOLN
1500.0000 mg | Freq: Once | INTRAVENOUS | Status: AC
  Administered 2022-12-10: 1500 mg via INTRAVENOUS
  Filled 2022-12-09: qty 300

## 2022-12-09 MED ORDER — ALPRAZOLAM 0.25 MG PO TABS: 0.2500 mg | ORAL_TABLET | Freq: Three times a day (TID) | ORAL | Status: DC | PRN

## 2022-12-09 MED ORDER — VANCOMYCIN HCL IN DEXTROSE 1-5 GM/200ML-% IV SOLN
1000.0000 mg | Freq: Two times a day (BID) | INTRAVENOUS | Status: DC
  Administered 2022-12-10 – 2022-12-12 (×4): 1000 mg via INTRAVENOUS
  Filled 2022-12-09 (×5): qty 200

## 2022-12-09 MED ORDER — ACETAMINOPHEN 650 MG RE SUPP: 650.0000 mg | Freq: Four times a day (QID) | RECTAL | Status: DC | PRN

## 2022-12-09 MED ORDER — DEXTROSE 50 % IV SOLN
INTRAVENOUS | Status: AC
  Filled 2022-12-09: qty 50

## 2022-12-09 MED ORDER — BUPIVACAINE HCL (PF) 0.25 % IJ SOLN
INTRAMUSCULAR | Status: DC | PRN
  Administered 2022-12-09: 7 mL

## 2022-12-09 MED ORDER — CEFAZOLIN SODIUM-DEXTROSE 2-3 GM-%(50ML) IV SOLR
INTRAVENOUS | Status: DC | PRN
  Administered 2022-12-09: 2 g via INTRAVENOUS

## 2022-12-09 MED ORDER — SODIUM CHLORIDE 0.9 % IR SOLN
Status: DC | PRN
  Administered 2022-12-09: 1000 mL

## 2022-12-09 MED ORDER — PROPOFOL 10 MG/ML IV BOLUS
INTRAVENOUS | Status: AC
  Filled 2022-12-09: qty 20

## 2022-12-09 MED ORDER — SODIUM CHLORIDE 0.9 % IR SOLN
Status: DC | PRN
  Administered 2022-12-09: 3000 mL

## 2022-12-09 MED ORDER — ATORVASTATIN CALCIUM 80 MG PO TABS
80.0000 mg | ORAL_TABLET | Freq: Every day | ORAL | Status: DC
  Administered 2022-12-10 – 2022-12-12 (×3): 80 mg via ORAL
  Filled 2022-12-09 (×3): qty 1

## 2022-12-09 MED ORDER — DIPHENHYDRAMINE HCL 25 MG PO CAPS: 25.0000 mg | ORAL_CAPSULE | Freq: Four times a day (QID) | ORAL | Status: DC | PRN

## 2022-12-09 MED ORDER — GABAPENTIN 300 MG PO CAPS
300.0000 mg | ORAL_CAPSULE | Freq: Two times a day (BID) | ORAL | Status: DC
  Administered 2022-12-09 – 2022-12-12 (×6): 300 mg via ORAL
  Filled 2022-12-09 (×6): qty 1

## 2022-12-09 MED ORDER — INSULIN ASPART 100 UNIT/ML IJ SOLN: 6.0000 [IU] | Freq: Once | INTRAMUSCULAR | Status: AC

## 2022-12-09 MED ORDER — ONDANSETRON HCL 4 MG/2ML IJ SOLN
INTRAMUSCULAR | Status: AC
  Filled 2022-12-09: qty 2

## 2022-12-09 MED ORDER — INSULIN ASPART 100 UNIT/ML IJ SOLN
INTRAMUSCULAR | Status: AC
  Administered 2022-12-09: 6 [IU] via SUBCUTANEOUS
  Filled 2022-12-09: qty 1

## 2022-12-09 MED ORDER — ACETAMINOPHEN 500 MG PO TABS
1000.0000 mg | ORAL_TABLET | Freq: Once | ORAL | Status: AC
  Administered 2022-12-09: 1000 mg via ORAL

## 2022-12-09 MED ORDER — DIPHENHYDRAMINE HCL 50 MG/ML IJ SOLN
25.0000 mg | Freq: Once | INTRAMUSCULAR | Status: AC
  Administered 2022-12-09: 25 mg via INTRAVENOUS

## 2022-12-09 MED ORDER — ACETAMINOPHEN 500 MG PO TABS
ORAL_TABLET | ORAL | Status: AC
  Filled 2022-12-09: qty 2

## 2022-12-09 MED ORDER — LIDOCAINE 2% (20 MG/ML) 5 ML SYRINGE
INTRAMUSCULAR | Status: DC | PRN
  Administered 2022-12-09: 60 mg via INTRAVENOUS

## 2022-12-09 MED ORDER — INSULIN GLARGINE-YFGN 100 UNIT/ML ~~LOC~~ SOLN
10.0000 [IU] | Freq: Once | SUBCUTANEOUS | Status: AC
  Administered 2022-12-09: 10 [IU] via SUBCUTANEOUS
  Filled 2022-12-09: qty 0.1

## 2022-12-09 MED ORDER — INSULIN ASPART 100 UNIT/ML IJ SOLN: 0.0000 [IU] | Freq: Every day | INTRAMUSCULAR | Status: DC

## 2022-12-09 MED ORDER — FENTANYL CITRATE PF 50 MCG/ML IJ SOSY
25.0000 ug | PREFILLED_SYRINGE | INTRAMUSCULAR | Status: DC | PRN
  Administered 2022-12-10 (×2): 50 ug via INTRAVENOUS
  Filled 2022-12-09 (×2): qty 1

## 2022-12-09 MED ORDER — DIPHENHYDRAMINE HCL 50 MG/ML IJ SOLN
INTRAMUSCULAR | Status: AC
  Filled 2022-12-09: qty 1

## 2022-12-09 MED ORDER — ORAL CARE MOUTH RINSE: 15.0000 mL | Freq: Once | OROMUCOSAL | Status: AC

## 2022-12-09 MED ORDER — LIDOCAINE 2% (20 MG/ML) 5 ML SYRINGE
INTRAMUSCULAR | Status: AC
  Filled 2022-12-09: qty 5

## 2022-12-09 MED ORDER — BUPIVACAINE HCL (PF) 0.25 % IJ SOLN
INTRAMUSCULAR | Status: AC
  Filled 2022-12-09: qty 30

## 2022-12-09 MED ORDER — CEFAZOLIN SODIUM 1 G IJ SOLR
INTRAMUSCULAR | Status: AC
  Filled 2022-12-09: qty 40

## 2022-12-09 MED ORDER — CHLORHEXIDINE GLUCONATE 0.12 % MT SOLN
15.0000 mL | Freq: Once | OROMUCOSAL | Status: AC
  Administered 2022-12-09: 15 mL via OROMUCOSAL
  Filled 2022-12-09: qty 15

## 2022-12-09 MED ORDER — HYDRALAZINE HCL 25 MG PO TABS: 25.0000 mg | ORAL_TABLET | Freq: Four times a day (QID) | ORAL | Status: DC | PRN

## 2022-12-09 MED ORDER — MIDAZOLAM HCL 2 MG/2ML IJ SOLN
INTRAMUSCULAR | Status: AC
  Filled 2022-12-09: qty 2

## 2022-12-09 MED ORDER — VANCOMYCIN HCL IN DEXTROSE 1-5 GM/200ML-% IV SOLN: 1000.0000 mg | Freq: Once | INTRAVENOUS | Status: DC

## 2022-12-09 MED ORDER — INSULIN GLARGINE-YFGN 100 UNIT/ML ~~LOC~~ SOLN
10.0000 [IU] | Freq: Every day | SUBCUTANEOUS | Status: DC
  Administered 2022-12-10: 10 [IU] via SUBCUTANEOUS
  Filled 2022-12-09 (×2): qty 0.1

## 2022-12-09 MED ORDER — ONDANSETRON HCL 4 MG/2ML IJ SOLN
INTRAMUSCULAR | Status: DC | PRN
  Administered 2022-12-09: 4 mg via INTRAVENOUS

## 2022-12-09 MED ORDER — HYDROMORPHONE HCL 1 MG/ML IJ SOLN
INTRAMUSCULAR | Status: AC
  Filled 2022-12-09: qty 1

## 2022-12-09 MED ORDER — INSULIN ASPART 100 UNIT/ML IJ SOLN
10.0000 [IU] | Freq: Once | INTRAMUSCULAR | Status: AC
  Administered 2022-12-09: 10 [IU] via SUBCUTANEOUS
  Filled 2022-12-09: qty 1

## 2022-12-09 MED ORDER — ONDANSETRON HCL 4 MG/2ML IJ SOLN: 4.0000 mg | Freq: Four times a day (QID) | INTRAMUSCULAR | Status: DC | PRN

## 2022-12-09 MED ORDER — LACTATED RINGERS IV SOLN: INTRAVENOUS | Status: DC

## 2022-12-09 MED ORDER — INSULIN ASPART 100 UNIT/ML IJ SOLN: 0.0000 [IU] | Freq: Three times a day (TID) | INTRAMUSCULAR | Status: DC

## 2022-12-09 MED ORDER — LOSARTAN POTASSIUM 25 MG PO TABS
25.0000 mg | ORAL_TABLET | Freq: Every day | ORAL | Status: DC
  Administered 2022-12-10 – 2022-12-12 (×3): 25 mg via ORAL
  Filled 2022-12-09 (×3): qty 1

## 2022-12-09 MED ORDER — HYDROMORPHONE HCL 1 MG/ML IJ SOLN
0.2500 mg | INTRAMUSCULAR | Status: DC | PRN
  Administered 2022-12-09 (×2): 0.5 mg via INTRAVENOUS

## 2022-12-09 MED ORDER — FENTANYL CITRATE (PF) 250 MCG/5ML IJ SOLN
INTRAMUSCULAR | Status: AC
  Filled 2022-12-09: qty 5

## 2022-12-09 MED ORDER — MIDAZOLAM HCL 2 MG/2ML IJ SOLN
INTRAMUSCULAR | Status: DC | PRN
  Administered 2022-12-09: 2 mg via INTRAVENOUS

## 2022-12-09 MED ORDER — METHOCARBAMOL 500 MG PO TABS
500.0000 mg | ORAL_TABLET | Freq: Three times a day (TID) | ORAL | Status: DC | PRN
  Administered 2022-12-10: 500 mg via ORAL
  Filled 2022-12-09: qty 1

## 2022-12-09 MED ORDER — PROPOFOL 10 MG/ML IV BOLUS
INTRAVENOUS | Status: DC | PRN
  Administered 2022-12-09: 200 mg via INTRAVENOUS

## 2022-12-09 MED ORDER — ACETAMINOPHEN 325 MG PO TABS: 650.0000 mg | ORAL_TABLET | Freq: Four times a day (QID) | ORAL | Status: DC | PRN

## 2022-12-09 MED ORDER — APIXABAN 5 MG PO TABS
5.0000 mg | ORAL_TABLET | Freq: Two times a day (BID) | ORAL | Status: DC
  Administered 2022-12-10 – 2022-12-12 (×5): 5 mg via ORAL
  Filled 2022-12-09 (×5): qty 1

## 2022-12-09 MED ORDER — METOPROLOL TARTRATE 12.5 MG HALF TABLET
12.5000 mg | ORAL_TABLET | Freq: Two times a day (BID) | ORAL | Status: DC
  Administered 2022-12-09 – 2022-12-12 (×6): 12.5 mg via ORAL
  Filled 2022-12-09 (×6): qty 1

## 2022-12-09 MED ORDER — FENTANYL CITRATE (PF) 250 MCG/5ML IJ SOLN
INTRAMUSCULAR | Status: DC | PRN
  Administered 2022-12-09 (×3): 50 ug via INTRAVENOUS

## 2022-12-09 MED ORDER — POLYETHYLENE GLYCOL 3350 17 G PO PACK: 17.0000 g | PACK | Freq: Every day | ORAL | Status: DC | PRN

## 2022-12-09 MED ORDER — ONDANSETRON HCL 4 MG PO TABS: 4.0000 mg | ORAL_TABLET | Freq: Four times a day (QID) | ORAL | Status: DC | PRN

## 2022-12-09 SURGICAL SUPPLY — 61 items
BAG COUNTER SPONGE SURGICOUNT (BAG) ×1 IMPLANT
BNDG COHESIVE 1X5 TAN STRL LF (GAUZE/BANDAGES/DRESSINGS) IMPLANT
BNDG ELASTIC 3INX 5YD STR LF (GAUZE/BANDAGES/DRESSINGS) ×1 IMPLANT
BNDG ELASTIC 4X5.8 VLCR STR LF (GAUZE/BANDAGES/DRESSINGS) ×1 IMPLANT
BNDG ESMARK 4X9 LF (GAUZE/BANDAGES/DRESSINGS) ×1 IMPLANT
BNDG GAUZE DERMACEA FLUFF 4 (GAUZE/BANDAGES/DRESSINGS) ×1 IMPLANT
CNTNR URN SCR LID CUP LEK RST (MISCELLANEOUS) IMPLANT
CONT SPEC 4OZ STRL OR WHT (MISCELLANEOUS) ×1
CORD BIPOLAR FORCEPS 12FT (ELECTRODE) ×1 IMPLANT
COVER SURGICAL LIGHT HANDLE (MISCELLANEOUS) ×1 IMPLANT
CUFF TOURN SGL QUICK 18X4 (TOURNIQUET CUFF) ×1 IMPLANT
CUFF TOURN SGL QUICK 24 (TOURNIQUET CUFF)
CUFF TRNQT CYL 24X4X16.5-23 (TOURNIQUET CUFF) IMPLANT
DRAIN PENROSE 12X.25 LTX STRL (MISCELLANEOUS) IMPLANT
DRAPE SURG 17X23 STRL (DRAPES) ×1 IMPLANT
DRSG ADAPTIC 3X8 NADH LF (GAUZE/BANDAGES/DRESSINGS) ×1 IMPLANT
DRSG EMULSION OIL 3X3 NADH (GAUZE/BANDAGES/DRESSINGS) IMPLANT
DURAPREP 26ML APPLICATOR (WOUND CARE) IMPLANT
ELECT REM PT RETURN 9FT ADLT (ELECTROSURGICAL)
ELECTRODE REM PT RTRN 9FT ADLT (ELECTROSURGICAL) IMPLANT
GAUZE SPONGE 4X4 12PLY STRL (GAUZE/BANDAGES/DRESSINGS) ×1 IMPLANT
GAUZE STRETCH 2X75IN STRL (MISCELLANEOUS) IMPLANT
GAUZE XEROFORM 1X8 LF (GAUZE/BANDAGES/DRESSINGS) ×1 IMPLANT
GAUZE XEROFORM 5X9 LF (GAUZE/BANDAGES/DRESSINGS) IMPLANT
GLOVE BIO SURGEON STRL SZ7.5 (GLOVE) ×1 IMPLANT
GLOVE BIOGEL PI IND STRL 7.5 (GLOVE) ×1 IMPLANT
GLOVE SURG SYN 7.5  E (GLOVE) ×1
GLOVE SURG SYN 7.5 E (GLOVE) ×1 IMPLANT
GLOVE SURG SYN 7.5 PF PI (GLOVE) IMPLANT
GOWN STRL REUS W/ TWL LRG LVL3 (GOWN DISPOSABLE) ×3 IMPLANT
GOWN STRL REUS W/ TWL XL LVL3 (GOWN DISPOSABLE) ×1 IMPLANT
GOWN STRL REUS W/TWL LRG LVL3 (GOWN DISPOSABLE) ×3
GOWN STRL REUS W/TWL XL LVL3 (GOWN DISPOSABLE) ×1
HANDPIECE INTERPULSE COAX TIP (DISPOSABLE)
KIT BASIN OR (CUSTOM PROCEDURE TRAY) ×1 IMPLANT
KIT TURNOVER KIT B (KITS) ×1 IMPLANT
MANIFOLD NEPTUNE II (INSTRUMENTS) ×1 IMPLANT
NDL HYPO 25GX1X1/2 BEV (NEEDLE) IMPLANT
NEEDLE HYPO 25GX1X1/2 BEV (NEEDLE) ×1 IMPLANT
NS IRRIG 1000ML POUR BTL (IV SOLUTION) ×1 IMPLANT
PACK ORTHO EXTREMITY (CUSTOM PROCEDURE TRAY) ×1 IMPLANT
PAD ARMBOARD 7.5X6 YLW CONV (MISCELLANEOUS) ×2 IMPLANT
PAD CAST 4YDX4 CTTN HI CHSV (CAST SUPPLIES) ×1 IMPLANT
PADDING CAST COTTON 4X4 STRL (CAST SUPPLIES)
SET CYSTO W/LG BORE CLAMP LF (SET/KITS/TRAYS/PACK) IMPLANT
SET HNDPC FAN SPRY TIP SCT (DISPOSABLE) IMPLANT
SOAP 2 % CHG 4 OZ (WOUND CARE) ×1 IMPLANT
SPONGE T-LAP 18X18 ~~LOC~~+RFID (SPONGE) ×1 IMPLANT
SPONGE T-LAP 4X18 ~~LOC~~+RFID (SPONGE) ×1 IMPLANT
SUT ETHILON 3 0 PS 1 (SUTURE) IMPLANT
SUT ETHILON 4 0 PS 2 18 (SUTURE) IMPLANT
SUT NYLON ETHILON 5-0 P-3 1X18 (SUTURE) IMPLANT
SWAB COLLECTION DEVICE MRSA (MISCELLANEOUS) ×1 IMPLANT
SWAB CULTURE ESWAB REG 1ML (MISCELLANEOUS) IMPLANT
SYR CONTROL 10ML LL (SYRINGE) IMPLANT
TOWEL GREEN STERILE (TOWEL DISPOSABLE) ×1 IMPLANT
TOWEL GREEN STERILE FF (TOWEL DISPOSABLE) ×1 IMPLANT
TUBE CONNECTING 12X1/4 (SUCTIONS) ×1 IMPLANT
UNDERPAD 30X36 HEAVY ABSORB (UNDERPADS AND DIAPERS) ×1 IMPLANT
WATER STERILE IRR 1000ML POUR (IV SOLUTION) ×1 IMPLANT
YANKAUER SUCT BULB TIP NO VENT (SUCTIONS) ×1 IMPLANT

## 2022-12-09 NOTE — Anesthesia Postprocedure Evaluation (Signed)
Anesthesia Post Note  Patient: Debra Ball  Procedure(s) Performed: IRRIGATION AND DEBRIDEMENT EXTREMITY (Right)     Patient location during evaluation: PACU Anesthesia Type: General Level of consciousness: awake and alert Pain management: pain level controlled Vital Signs Assessment: post-procedure vital signs reviewed and stable Respiratory status: spontaneous breathing, nonlabored ventilation and respiratory function stable Cardiovascular status: blood pressure returned to baseline and stable Postop Assessment: no apparent nausea or vomiting Anesthetic complications: no  No notable events documented.  Last Vitals:  Vitals:   12/09/22 2115 12/09/22 2130  BP: (!) 179/156 (!) 141/92  Pulse: 96 92  Resp: 14 14  Temp:    SpO2: 93% 93%    Last Pain:  Vitals:   12/09/22 2130  TempSrc:   PainSc: 4                  Jerret Mcbane,W. EDMOND

## 2022-12-09 NOTE — Anesthesia Procedure Notes (Signed)
Procedure Name: LMA Insertion Date/Time: 12/09/2022 7:39 PM  Performed by: Sheppard Evens, CRNAPre-anesthesia Checklist: Patient identified, Emergency Drugs available, Suction available and Patient being monitored Patient Re-evaluated:Patient Re-evaluated prior to induction Oxygen Delivery Method: Circle System Utilized Preoxygenation: Pre-oxygenation with 100% oxygen Induction Type: IV induction Ventilation: Mask ventilation without difficulty LMA: LMA inserted LMA Size: 4.0 Number of attempts: 1 Airway Equipment and Method: Bite block Placement Confirmation: positive ETCO2 Tube secured with: Tape Dental Injury: Teeth and Oropharynx as per pre-operative assessment

## 2022-12-09 NOTE — Anesthesia Preprocedure Evaluation (Signed)
Anesthesia Evaluation  Patient identified by MRN, date of birth, ID band Patient awake    Reviewed: Allergy & Precautions, H&P , NPO status , Patient's Chart, lab work & pertinent test results, reviewed documented beta blocker date and time   Airway Mallampati: III  TM Distance: >3 FB Neck ROM: Full    Dental no notable dental hx. (+) Teeth Intact, Dental Advisory Given   Pulmonary neg pulmonary ROS, Current Smoker and Patient abstained from smoking.   Pulmonary exam normal breath sounds clear to auscultation       Cardiovascular hypertension, Pt. on medications and Pt. on home beta blockers  Rhythm:Regular Rate:Normal     Neuro/Psych    Depression    negative neurological ROS     GI/Hepatic negative GI ROS, Neg liver ROS,,,  Endo/Other  diabetes, Poorly Controlled, Insulin Dependent, Oral Hypoglycemic Agents    Renal/GU negative Renal ROS  negative genitourinary   Musculoskeletal   Abdominal   Peds  Hematology  (+) Blood dyscrasia, anemia   Anesthesia Other Findings   Reproductive/Obstetrics negative OB ROS                             Anesthesia Physical Anesthesia Plan  ASA: 3  Anesthesia Plan: General   Post-op Pain Management: Tylenol PO (pre-op)*   Induction: Intravenous  PONV Risk Score and Plan: 3 and Ondansetron, Dexamethasone and Midazolam  Airway Management Planned: LMA  Additional Equipment:   Intra-op Plan:   Post-operative Plan: Extubation in OR  Informed Consent: I have reviewed the patients History and Physical, chart, labs and discussed the procedure including the risks, benefits and alternatives for the proposed anesthesia with the patient or authorized representative who has indicated his/her understanding and acceptance.     Dental advisory given  Plan Discussed with: CRNA  Anesthesia Plan Comments:        Anesthesia Quick Evaluation

## 2022-12-09 NOTE — H&P (Signed)
History and Physical    Shaka Taney ZOX:[id number] DOB: 12-22-1975 DOA: 12/09/2022  PCP: Kyla Balzarine, PA-C   Patient coming from: Home   Chief Complaint: Increasing right hand pain, redness, and swelling   HPI: Debra Ball is a 47 y.o. 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 Kerry Fort who advised medical admission for management of surrounding cellulitis and medical comorbidity.   Review of Systems:  All other systems reviewed and apart from HPI, are negative.  Past Medical History:  Diagnosis Date   Anemia    Depression    Diabetes mellitus without complication (HCC)    Pregnancy induced hypertension     Past Surgical History:  Procedure Laterality Date   AMPUTATION Left 12/04/2018   Procedure: LEFT BELOW KNEE AMPUTATION;  Surgeon: Chuck Hint, MD;  Location: [facility] OR;  Service: Vascular;  Laterality: Left;   AORTOGRAM Left 12/01/2018   Procedure: Abdominal Aortogram with left leg runoff;  Surgeon: Nada Libman, MD;  Location: [facility] OR;  Service: Vascular;  Laterality: Left;   FEMORAL-TIBIAL BYPASS GRAFT Left 11/07/2018   Procedure: THROMBECTOMY POPLITEAL, ANTERIOR TIBIAL ARTERY, POSTERIOR TIBIAL ARTERY, AND VEIN PATCH ANTERIOR ARTERY;  Surgeon: Nada Libman, MD;  Location: [facility] OR;  Service: Vascular;  Laterality: Left;   LOWER EXTREMITY ANGIOGRAM Left 12/02/2018   Procedure: Left Lower Extremity Angiogram;  Surgeon: Nada Libman, MD;  Location: [facility] OR;  Service: Vascular;  Laterality: Left;   LOWER EXTREMITY ANGIOGRAPHY Bilateral 11/06/2018   Procedure: LOWER EXTREMITY ANGIOGRAPHY;   Surgeon: Nada Libman, MD;  Location: [facility] INVASIVE CV LAB;  Service: Cardiovascular;  Laterality: Bilateral;  And ABD    PATCH ANGIOPLASTY Left 12/01/2018   Procedure: Patch Angioplasty with xenosure Biological patch on Left tibia fibula trunk;  Surgeon: Nada Libman, MD;  Location: [facility] OR;  Service: Vascular;  Laterality: Left;   TEE WITHOUT CARDIOVERSION  12/01/2018   Procedure: Transesophageal Echocardiogram (Tee);  Surgeon: Nada Libman, MD;  Location: [facility] OR;  Service: Vascular;;   THROMBECTOMY FEMORAL ARTERY Left 12/01/2018   Procedure: Thrombectomy left leg, SFA, Popliteal artery, posterior tibial artery, and anterior itbial artery, Redo popliteal exposure, posterior tibia exposure;  Surgeon: Nada Libman, MD;  Location: [facility] OR;  Service: Vascular;  Laterality: Left;   THROMBECTOMY FEMORAL ARTERY Left 12/02/2018   Procedure: Thrombectomy left leg, SFA, Popliteal artery, posterior tibial artery, and anterior tibial artery, Redo popliteal exposure, posterior tibia exposure, Patch Angioplasty with xenosure Biological patch on Left tibia fibula trunk, Abdominal Aortogram with left leg runoff;  Surgeon: Nada Libman, MD;  Location: [facility] OR;  Service: Vascular;  Laterality: Left;   THROMBECTOMY FEMORAL ARTERY  12/02/2018   Procedure: Thrombectomy of left SFA, popliteal artery, posterior tibial artery, and anterior tibial artery;  Surgeon: Nada Libman, MD;  Location: [facility] OR;  Service: Vascular;;   TONSILLECTOMY      Social History:   reports that she has been smoking cigarettes. She has been smoking an average of .25 packs per day. She has never used smokeless tobacco. She reports current alcohol use. She reports that she does not use drugs.  Allergies  Allergen Reactions   Oxycodone-Acetaminophen Other (See Comments)    Caused seizures  type reactions while taking medication   Hydrocodone Bitartrate Er Hives    History reviewed. No pertinent family history.   Prior to  Admission medications   Medication Sig Start Date End Date Taking? Authorizing Provider  atorvastatin (LIPITOR) 80 MG tablet Take 80 mg by mouth daily.   Yes [provider]  ELIQUIS 5 MG TABS tablet Take 5 mg by mouth 2 (two) times daily. 01/21/19  Yes [provider]  empagliflozin (JARDIANCE) 25 MG TABS tablet Take 25 mg by mouth daily.   Yes [provider]  gabapentin (NEURONTIN) 300 MG capsule Take 300 mg by mouth 2 (two) times daily.   Yes [provider]  insulin glargine (LANTUS) 100 UNIT/ML injection Inject 10 Units into the skin every morning.   Yes [provider]  losartan (COZAAR) 25 MG tablet Take 25 mg by mouth daily.   Yes [provider]  Semaglutide (RYBELSUS) 7 MG TABS Take 7 mg by mouth every 7 (seven) days.   Yes [provider]  traMADol (ULTRAM) 50 MG tablet Take 50 mg by mouth every 6 (six) hours as needed for moderate pain.   Yes [provider]  Vitamin D, Ergocalciferol, (DRISDOL) 1.25 MG (50000 UNIT) CAPS capsule Take 50,000 Units by mouth every 7 (seven) days.   Yes [provider]  acetaminophen (TYLENOL) 325 MG tablet Take 1-2 tablets (325-650 mg total) by mouth every 4 (four) hours as needed for mild pain (or temp >/= 101 F). 12/14/18   Angiulli, Mcarthur Rossetti, PA-C  ALPRAZolam Prudy Feeler) 0.25 MG tablet Take 1-2 tablets (0.25-0.5 mg total) by mouth 3 (three) times daily as needed for anxiety. 12/14/18   Angiulli, Mcarthur Rossetti, PA-C  docusate sodium (COLACE) 100 MG capsule Take 1 capsule (100 mg total) by mouth daily. 12/14/18   Angiulli, Mcarthur Rossetti, PA-C  Gauze Pads & Dressings (CURITY IODOFORM PACKING STRIP) MISC 1 strip by Does not apply route daily. 03/07/19   Marcello Fennel, MD  methocarbamol (ROBAXIN) 500 MG tablet Take 1 tablet (500 mg total) by mouth every 8 (eight) hours as needed for muscle spasms. 12/14/18   Angiulli, Mcarthur Rossetti, PA-C  metoprolol tartrate (LOPRESSOR) 25 MG tablet 12.5 mg 2 (two)  times daily. 01/20/19   [provider]  pantoprazole (PROTONIX) 40 MG tablet Take 1 tablet (40 mg total) by mouth daily. 12/14/18   Angiulli, Mcarthur Rossetti, PA-C  pregabalin (LYRICA) 100 MG capsule TAKE 1 CAPSULE BY MOUTH THREE TIMES DAILY 10/08/19   Marcello Fennel, MD    Physical Exam: Vitals:   12/09/22 2115 12/09/22 2130 12/09/22 2145 12/09/22 2200  BP: (!) 179/156 (!) 141/92 (!) 149/95 (!) 160/101  Pulse: 96 92 89 97  Resp: 14 14 16 20   Temp:      TempSrc:      SpO2: 93% 93% 97% 98%  Weight:      Height:         Constitutional: NAD, no pallor or diaphoresis  Eyes: PERTLA, lids and conjunctivae normal ENMT: Mucous membranes are dry. Posterior pharynx clear of any exudate or lesions.   Neck: supple, no masses  Respiratory: no wheezing, no crackles. No accessory muscle use.  Cardiovascular: S1 & S2 heard, regular rate and rhythm. No extremity edema.   Abdomen: No distension, no tenderness, soft. Bowel sounds active.  Musculoskeletal: no clubbing / cyanosis. S/p Left BKA. Distal RUE immobilized.   Skin: no significant rashes, lesions, ulcers. Warm, dry, well-perfused. Neurologic: CN 2-12 grossly intact.  Moving all extremities. Alert and oriented.  Psychiatric: Calm. Cooperative.    Labs and Imaging on Admission: I have personally reviewed following labs and imaging studies  CBC: Recent Labs  Lab 12/09/22 1529  WBC 6.6  HGB 17.9*  HCT 48.2*  MCV 95.1  PLT 251   Basic Metabolic Panel: Recent Labs  Lab 12/09/22 1829  NA 135  K 3.9  CL 103  CO2 20*  GLUCOSE 190*  BUN 8  CREATININE 0.76  CALCIUM 9.4   GFR: Estimated Creatinine Clearance: 88.8 mL/min (by C-G formula based on SCr of 0.76 mg/dL). Liver Function Tests: No results for input(s): "AST", "ALT", "ALKPHOS", "BILITOT", "PROT", "ALBUMIN" in the last 168 hours. No results for input(s): "LIPASE", "AMYLASE" in the last 168 hours. No results for input(s): "AMMONIA" in the last 168 hours. Coagulation  Profile: No results for input(s): "INR", "PROTIME" in the last 168 hours. Cardiac Enzymes: No results for input(s): "CKTOTAL", "CKMB", "CKMBINDEX", "TROPONINI" in the last 168 hours. BNP (last 3 results) No results for input(s): "PROBNP" in the last 8760 hours. HbA1C: No results for input(s): "HGBA1C" in the last 72 hours. CBG: Recent Labs  Lab 12/09/22 1705 12/09/22 1821 12/09/22 1920 12/09/22 2042 12/09/22 2105  GLUCAP 285* 228* 146* 74 136*   Lipid Profile: No results for input(s): "CHOL", "HDL", "LDLCALC", "TRIG", "CHOLHDL", "LDLDIRECT" in the last 72 hours. Thyroid Function Tests: No results for input(s): "TSH", "T4TOTAL", "FREET4", "T3FREE", "THYROIDAB" in the last 72 hours. Anemia Panel: No results for input(s): "VITAMINB12", "FOLATE", "FERRITIN", "TIBC", "IRON", "RETICCTPCT" in the last 72 hours. Urine analysis: No results found for: "COLORURINE", "APPEARANCEUR", "LABSPEC", "PHURINE", "GLUCOSEU", "HGBUR", "BILIRUBINUR", "KETONESUR", "PROTEINUR", "UROBILINOGEN", "NITRITE", "LEUKOCYTESUR" Sepsis Labs: @LABRCNTIP (procalcitonin:4,lacticidven:4) ) Recent Results (from the past 240 hour(s))  Aerobic/Anaerobic Culture w Gram Stain (surgical/deep wound)     Status: None (Preliminary result)   Collection Time: 12/09/22  7:50 PM   Specimen: Hand, Right; Tissue  Result Value Ref Range Status   Specimen Description WOUND  Final   Special Requests   Final    RIGHT PALM, SPEC A Performed at [facility], [address], [place], [place] [address]    Gram Stain PENDING  Incomplete   Culture PENDING  Incomplete   Report Status PENDING  Incomplete  Aerobic/Anaerobic Culture w Gram Stain (surgical/deep wound)     Status: None (Preliminary result)   Collection Time: 12/09/22  7:58 PM   Specimen: Hand, Right; Tissue  Result Value Ref Range Status   Specimen Description TISSUE  Final   Special Requests   Final    RIGHT PALM, SPEC C Performed at [facility], [address], [place], [place] [address]    Gram Stain PENDING  Incomplete   Culture PENDING  Incomplete   Report Status PENDING  Incomplete     Radiological Exams on Admission: No results found.  EKG: Independently reviewed. Sinus tachycardia, rate 105.   Assessment/Plan   1. Right palm surgical site infection  - Continue pain-control, broad-spectrum antibiotics, monitor for bleeding when resuming Eliquis tomorrow, and follow cultures and clinical response to treatment    2. Insulin-dependent DM  - A1c was 10.5% in March 2024  - Continue CBG checks and insulin   3. PAD; hypercoagulable state  - Hx recurrent thromboses, left BKA  - No acute ischemia  - Continue Lipitor; per surgeon, okay to resume Eliquis tomorrow with close monitoring   4. Hypertensive urgency  - BP severely elevated in setting of pain;  no end organ damage - Continue pain-control, continue losartan and metoprolol, use hydralazine if needed     DVT prophylaxis: Eliquis to be resumed tomorrow  Code Status: Full  Level of Care: Level of care: Med-Surg Family Communication: None present  Disposition Plan:  Patient is from: home  Anticipated d/c is to: home  Anticipated d/c date is: 12/11/22  Patient currently: Pending pain-control, cultures, clearance by surgery Consults called: Hand surgery  Admission status: Inpatient     Briscoe Deutscher, MD [facility]  12/09/2022, 10:12 PM

## 2022-12-09 NOTE — Progress Notes (Signed)
Pharmacy Antibiotic Note  Debra Ball is a 47 y.o. female admitted on 12/09/2022 with right carpal tunnel release approximately 2 weeks ago now with right palm surgical site infection s/p I&D 5/7.  Pharmacy has been consulted for vancomycin dosing.  5/17 Vancomycin 1000mg  Q 12 hr Scr used: 0.8 mg/dL Weight: 16.1 kg Vd coeff: 0.5 L/kg Est AUC: 440  Plan: Vancomycin 1500mg  x1 then 1000mg  q12 hr Monitor cultures, clinical status, renal function, vancomycin level Narrow abx as able and f/u duration     Height: 5\' 2"  (157.5 cm) Weight: 81.6 kg (180 lb) IBW/kg (Calculated) : 50.1  Temp (24hrs), Avg:97.9 F (36.6 C), Min:97.9 F (36.6 C), Max:97.9 F (36.6 C)  Recent Labs  Lab 12/09/22 1529 12/09/22 1829  WBC 6.6  --   CREATININE  --  0.76    Estimated Creatinine Clearance: 88.8 mL/min (by C-G formula based on SCr of 0.76 mg/dL).    Allergies  Allergen Reactions   Oxycodone-Acetaminophen Other (See Comments)    Caused seizures type reactions while taking medication   Hydrocodone Bitartrate Er Hives    Antimicrobials this admission: vancomycin 5/17 >>  Cefaz 5/17   Dose adjustments this admission: N/a  Microbiology results: 5/17 BCx:   5/17 OR culture:  Thank you for allowing pharmacy to be a part of this patient's care.  Alphia Moh, PharmD, BCPS, BCCP Clinical Pharmacist  Please check AMION for all [facility] Pharmacy phone numbers After 10:00 PM, call Main Pharmacy [phone]

## 2022-12-09 NOTE — Op Note (Signed)
NAME: Debra Ball MEDICAL RECORD NO: [id number] DATE OF BIRTH: 12/11/1975 FACILITY: [facility] LOCATION: [facility] OR PHYSICIAN: Ramon Dredge MD   OPERATIVE REPORT   DATE OF PROCEDURE: 12/09/22    PREOPERATIVE DIAGNOSIS: Right palm surgical site infection   POSTOPERATIVE DIAGNOSIS: Same   PROCEDURE: Right hand irrigation debridement deep   SURGEON: Edsel Petrin. Dionisio Aragones, M.D.   ASSISTANT: None   ANESTHESIA:  General   INTRAVENOUS FLUIDS:  Per anesthesia flow sheet.   ESTIMATED BLOOD LOSS:  Minimal.   COMPLICATIONS:  None.   SPECIMENS: Culture swabs and specimen for cultures and culture swab #2   TOURNIQUET TIME:    Total Tourniquet Time Documented: Upper Arm (Right) - 21 minutes Total: Upper Arm (Right) - 21 minutes    DISPOSITION:  Stable to PACU.   INDICATIONS: 47 year old female who had right open carpal tunnel release approximately 2 weeks ago with Dr. [name], who presented to clinic earlier today for a postoperative visit and was found to have right palm cellulitis.  OPERATIVE COURSE: Patient was identified in holding and the site, side, and surgery were confirmed.  The risk, benefits, and alternatives were reviewed and informed consent was obtained.  Patient was brought to the operating room and transferred to the OR table with the right upper extremity outstretched on an armboard.  A tourniquet was placed on the arm.  General anesthesia was induced by the anesthesia team.  The right upper extremity was prepped and draped in typical sterile fashion with DuraPrep.  The right hand was elevated for a few minutes to provide gravity exsanguination and the tourniquet was inflated to 250 mmHg.  The prior incision was utilized and reopened with a 15 blade.  No purulence was encountered.  The soft tissues were bluntly dissected with tenotomy's and pickups down to the median nerve.  Cultures were taken.  The distalmost aspect of the wound had what  appeared to be a small amount of purulence.  The distal aspect of the wound was extended with a 45 degree angle radially.  Purulence was encountered superficially.  Cultures #2 were obtained.  At this point, the wound was thoroughly irrigated with 3 L of normal saline.  The tourniquet was deflated, hemostasis was achieved with bipolar electrocautery.  A single suture was placed to approximate the skin in the center of the wound due to the exposed median nerve.  The wound was then covered with sterile Adaptec, gauze, Kerlix and an Ace bandage.  The patient was awakened without issue and taken recovery.  Counts were correct x 2.  Postop plan: Plan for admission for IV antibiotics due to her cellulitis.  She was given Ancef after cultures were taken intraoperatively so I am hopeful that we will be able to determine the specific bacterial infection that she has.  We will have her start antibacterial soap soaks in 24 hours.  Antibacterial soap soaks Supplies needed: -Liquid antibacterial soap: Hibiclens or Dial antibacterial soap -clean basin large enough to submerge wound (to be rinsed and dried between uses) -clean dry towel -Sterile telfa non-adhesive gauze -Sterile gauze -comfortable tape or self adhesive wrap (Coban or ACE)   Perform 3 times per day: Put antibacterial soap in basin with warm water and mix until suds have formed. Soak wound in warm soapy water for 10 minutes in clean basin Gently pat dry Place Telfa gauze over wound Cover with fresh sterile gauze and loose wrap    Ramon Dredge, MD Electronically signed, 12/09/22

## 2022-12-09 NOTE — H&P (Signed)
Orthopaedic Surgery Hand and Upper Extremity History and Physical Examination 12/09/2022  Referring Provider: Ramon Dredge, MD [address] [place],  [place] [address]  CC: Right palm surgical site infection  HPI: Debra Ball is a 47 y.o. female who had a right carpal tunnel release approximately 2 weeks ago by my partner Dr. [name].  Unfortunately she developed worsening pain swelling and redness.  She was seen in clinic earlier today due to concern for infection I decided to take her to the operating room.    Past Medical History: Past Medical History:  Diagnosis Date   Anemia    Depression    Diabetes mellitus without complication (HCC)    Pregnancy induced hypertension      Medications: Scheduled Meds: Continuous Infusions:  lactated ringers 10 mL/hr at 12/09/22 1535   PRN Meds:.  Allergies: Allergies as of 12/09/2022 - Review Complete 12/09/2022  Allergen Reaction Noted   Oxycodone-acetaminophen Other (See Comments) 12/27/2018   Hydrocodone bitartrate er Hives 11/06/2018    Past Surgical History: Past Surgical History:  Procedure Laterality Date   AMPUTATION Left 12/04/2018   Procedure: LEFT BELOW KNEE AMPUTATION;  Surgeon: Chuck Hint, MD;  Location: [facility] OR;  Service: Vascular;  Laterality: Left;   AORTOGRAM Left 12/01/2018   Procedure: Abdominal Aortogram with left leg runoff;  Surgeon: Nada Libman, MD;  Location: [facility] OR;  Service: Vascular;  Laterality: Left;   FEMORAL-TIBIAL BYPASS GRAFT Left 11/07/2018   Procedure: THROMBECTOMY POPLITEAL, ANTERIOR TIBIAL ARTERY, POSTERIOR TIBIAL ARTERY, AND VEIN PATCH ANTERIOR ARTERY;  Surgeon: Nada Libman, MD;  Location: [facility] OR;  Service: Vascular;  Laterality: Left;   LOWER EXTREMITY ANGIOGRAM Left 12/02/2018   Procedure: Left Lower Extremity Angiogram;  Surgeon: Nada Libman, MD;  Location: [facility] OR;  Service: Vascular;  Laterality: Left;   LOWER EXTREMITY ANGIOGRAPHY  Bilateral 11/06/2018   Procedure: LOWER EXTREMITY ANGIOGRAPHY;  Surgeon: Nada Libman, MD;  Location: [facility] INVASIVE CV LAB;  Service: Cardiovascular;  Laterality: Bilateral;  And ABD    PATCH ANGIOPLASTY Left 12/01/2018   Procedure: Patch Angioplasty with xenosure Biological patch on Left tibia fibula trunk;  Surgeon: Nada Libman, MD;  Location: [facility] OR;  Service: Vascular;  Laterality: Left;   TEE WITHOUT CARDIOVERSION  12/01/2018   Procedure: Transesophageal Echocardiogram (Tee);  Surgeon: Nada Libman, MD;  Location: [facility] OR;  Service: Vascular;;   THROMBECTOMY FEMORAL ARTERY Left 12/01/2018   Procedure: Thrombectomy left leg, SFA, Popliteal artery, posterior tibial artery, and anterior itbial artery, Redo popliteal exposure, posterior tibia exposure;  Surgeon: Nada Libman, MD;  Location: [facility] OR;  Service: Vascular;  Laterality: Left;   THROMBECTOMY FEMORAL ARTERY Left 12/02/2018   Procedure: Thrombectomy left leg, SFA, Popliteal artery, posterior tibial artery, and anterior tibial artery, Redo popliteal exposure, posterior tibia exposure, Patch Angioplasty with xenosure Biological patch on Left tibia fibula trunk, Abdominal Aortogram with left leg runoff;  Surgeon: Nada Libman, MD;  Location: [facility] OR;  Service: Vascular;  Laterality: Left;   THROMBECTOMY FEMORAL ARTERY  12/02/2018   Procedure: Thrombectomy of left SFA, popliteal artery, posterior tibial artery, and anterior tibial artery;  Surgeon: Nada Libman, MD;  Location: [facility] OR;  Service: Vascular;;   TONSILLECTOMY       Social History: Social History   Occupational History   Not on file  Tobacco Use   Smoking status: Every Day    Packs/day: .25    Types: Cigarettes   Smokeless tobacco: Never  Vaping Use   Vaping Use: Never used  Substance and Sexual Activity   Alcohol use: Yes    Comment: occ   Drug use: Never   Sexual activity: Not on file     Family History: History reviewed. No pertinent family  history. Otherwise, no relevant orthopaedic family history  ROS: Review of Systems: All systems reviewed and are negative except that mentioned in HPI  Work/Sport/Hobbies: See HPI  Physical Examination: Vitals:   12/09/22 1518 12/09/22 1609  BP: 120/85 (!) 108/55  Pulse: (!) 102   Resp: 18   Temp: 97.9 F (36.6 C)   SpO2: 96%    Constitutional: Awake, alert.  WN/WD Appearance: healthy, no acute distress, well-groomed Affect: Normal HEENT: EOMI, mucous membranes moist CV: RRR Pulm: breathing comfortably  Neck:   FROM, no pain  Right upper Extremity / Hand Erythema and swelling of the thenar and hypothenar eminences.  Tenderness to palpation in these regions as well.  Incision appears well-healed without any obvious purulence drainage or dehiscence.  Sensation intact in median, radial, ulnar nerve distributions.  Motor intact in AIN, PIN, ulnar nerve distributions.  Fingers are warm and well-perfused.   Pertinent Labs: n/a  Imaging: I have personally reviewed the following studies:   Additional Studies: n/a  Assessment/Plan: Surgical site infection of the right palm  Plan to take to the OR tonight and possible admission to medicine for IV antibiotics.  Shaune Pollack, MD Hand and Upper Extremity Surgery [facility] [phone] 12/09/2022 6:08 PM

## 2022-12-09 NOTE — Transfer of Care (Signed)
Immediate Anesthesia Transfer of Care Note  Patient: Debra Ball  Procedure(s) Performed: IRRIGATION AND DEBRIDEMENT EXTREMITY (Right)  Patient Location: PACU  Anesthesia Type:General  Level of Consciousness: awake and alert   Airway & Oxygen Therapy: Patient Spontanous Breathing  Post-op Assessment: Report given to RN and Post -op Vital signs reviewed and stable  Post vital signs: Reviewed and stable  Last Vitals:  Vitals Value Taken Time  BP 169/93 12/09/22 2028  Temp    Pulse 119 12/09/22 2031  Resp 32 12/09/22 2031  SpO2 92 % 12/09/22 2031  Vitals shown include unvalidated device data.  Last Pain:  Vitals:   12/09/22 1520  TempSrc:   PainSc: 10-Worst pain ever         Complications: No notable events documented.

## 2022-12-09 NOTE — Progress Notes (Addendum)
Patient arrived in short stay, alert and oriented x 4. CBG 422 - patient states that she didn't have any medicine today. Per Dr. Maple Hudson, Patient will receive Lantus s.c. 10 units and Novolog s.c. 10 units, and recheck CBG in 1 hr. Will continue to monitor.

## 2022-12-10 ENCOUNTER — Encounter (HOSPITAL_COMMUNITY): Payer: Self-pay | Admitting: Orthopedic Surgery

## 2022-12-10 DIAGNOSIS — D6859 Other primary thrombophilia: Secondary | ICD-10-CM | POA: Diagnosis not present

## 2022-12-10 DIAGNOSIS — E119 Type 2 diabetes mellitus without complications: Secondary | ICD-10-CM | POA: Diagnosis not present

## 2022-12-10 DIAGNOSIS — I160 Hypertensive urgency: Secondary | ICD-10-CM | POA: Diagnosis not present

## 2022-12-10 DIAGNOSIS — T8149XA Infection following a procedure, other surgical site, initial encounter: Secondary | ICD-10-CM | POA: Diagnosis not present

## 2022-12-10 LAB — CBC
HCT: 45 % (ref 36.0–46.0)
Hemoglobin: 16.1 g/dL — ABNORMAL HIGH (ref 12.0–15.0)
MCH: 34.5 pg — ABNORMAL HIGH (ref 26.0–34.0)
MCHC: 35.8 g/dL (ref 30.0–36.0)
MCV: 96.4 fL (ref 80.0–100.0)
Platelets: 207 10*3/uL (ref 150–400)
RBC: 4.67 MIL/uL (ref 3.87–5.11)
RDW: 12.8 % (ref 11.5–15.5)
WBC: 9.1 10*3/uL (ref 4.0–10.5)
nRBC: 0 % (ref 0.0–0.2)

## 2022-12-10 LAB — GLUCOSE, CAPILLARY
Glucose-Capillary: 287 mg/dL — ABNORMAL HIGH (ref 70–99)
Glucose-Capillary: 412 mg/dL — ABNORMAL HIGH (ref 70–99)
Glucose-Capillary: 393 mg/dL — ABNORMAL HIGH (ref 70–99)
Glucose-Capillary: 329 mg/dL — ABNORMAL HIGH (ref 70–99)
Glucose-Capillary: 330 mg/dL — ABNORMAL HIGH (ref 70–99)

## 2022-12-10 LAB — BASIC METABOLIC PANEL
Anion gap: 11 (ref 5–15)
BUN: 8 mg/dL (ref 6–20)
CO2: 19 mmol/L — ABNORMAL LOW (ref 22–32)
Calcium: 8.3 mg/dL — ABNORMAL LOW (ref 8.9–10.3)
Chloride: 100 mmol/L (ref 98–111)
Creatinine, Ser: 0.83 mg/dL (ref 0.44–1.00)
GFR, Estimated: >60 mL/min (ref >60)
Glucose, Bld: 459 mg/dL — ABNORMAL HIGH (ref 70–99)
Potassium: 4.6 mmol/L (ref 3.5–5.1)
Sodium: 130 mmol/L — ABNORMAL LOW (ref 135–145)

## 2022-12-10 LAB — AEROBIC/ANAEROBIC CULTURE W GRAM STAIN (SURGICAL/DEEP WOUND)

## 2022-12-10 LAB — HEMOGLOBIN A1C
Hgb A1c MFr Bld: 12.3 % — ABNORMAL HIGH (ref 4.8–5.6)
Mean Plasma Glucose: 306.31 mg/dL

## 2022-12-10 LAB — MAGNESIUM: Magnesium: 2.1 mg/dL (ref 1.7–2.4)

## 2022-12-10 LAB — HIV ANTIBODY (ROUTINE TESTING W REFLEX): HIV Screen 4th Generation wRfx: NONREACTIVE

## 2022-12-10 MED ORDER — INSULIN ASPART 100 UNIT/ML IJ SOLN
15.0000 [IU] | Freq: Once | INTRAMUSCULAR | Status: AC
  Administered 2022-12-10: 15 [IU] via SUBCUTANEOUS

## 2022-12-10 MED ORDER — INSULIN ASPART 100 UNIT/ML IJ SOLN
0.0000 [IU] | Freq: Every day | INTRAMUSCULAR | Status: DC
  Administered 2022-12-10 – 2022-12-11 (×2): 4 [IU] via SUBCUTANEOUS

## 2022-12-10 MED ORDER — INSULIN ASPART 100 UNIT/ML IJ SOLN
0.0000 [IU] | Freq: Three times a day (TID) | INTRAMUSCULAR | Status: DC
  Administered 2022-12-10: 11 [IU] via SUBCUTANEOUS
  Administered 2022-12-10: 15 [IU] via SUBCUTANEOUS
  Administered 2022-12-11: 11 [IU] via SUBCUTANEOUS
  Administered 2022-12-11: 5 [IU] via SUBCUTANEOUS
  Administered 2022-12-11: 15 [IU] via SUBCUTANEOUS
  Administered 2022-12-12: 8 [IU] via SUBCUTANEOUS

## 2022-12-10 NOTE — Hospital Course (Addendum)
Debra Ball is a 47 y.o. 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 Kerry Fort and was advised admission to the hospital for cellulitis, antibiotic and further care.  Assessment/Plan    Right palm surgical site infection  Continue analgesia, IV antibiotic with vancomycin.   To resume Eliquis today.  Surgical culture with no organisms in 1 day.   Insulin-dependent DM  Last known A1c was 10.5% in March 2024.  Continue on insulin regimen. - Continue CBG checks and insulin    PAD; hypercoagulable state  History of recurrent thromboses, left BKA.  Continue Lipitor.  Will resume Eliquis 12/10/2022   Hypertensive urgency  Improved with pain control.  Continue losartan metoprolol and hydralazine.

## 2022-12-10 NOTE — Progress Notes (Signed)
PROGRESS NOTE    Debra Ball  WUJ:[id number] DOB: [birth year] DOA: 12/09/2022 PCP: Kyla Balzarine, PA-C    Brief Narrative:  Debra Ball is a 47 y.o. 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 Kerry Fort and was advised admission to the hospital for cellulitis, antibiotic and further care.  Assessment/Plan    Right palm surgical site infection  Status post right hand irrigation and debridement by orthopedics on 12/09/2022.  Continue analgesia, IV antibiotic with vancomycin.   To resume Eliquis today.  Surgical culture with no organisms in 1 day.  Follow orthopedic recommendation.   Insulin-dependent DM with hyperglycemia. Last known A1c was 10.5% in March 2024.  Continue on insulin regimen. Continue CBG checks and insulin.  Will increase sliding scale to moderate scale insulin.   PAD; hypercoagulable state  History of recurrent thromboses, left BKA.  Continue Lipitor.  Will resume Eliquis 12/10/2022   Hypertensive urgency  Improved with pain control.  Continue losartan metoprolol and hydralazine.       DVT prophylaxis:  apixaban (ELIQUIS) tablet 5 mg   Code Status:     Code Status: Full Code  Disposition: Home  Status is: Inpatient Remains inpatient appropriate because: IV antibiotic, orthopedic follow-up, hyperglycemia   Family Communication: None at bedside  Consultants:  Orthopedics  Procedures:  Right hand irrigation and debridement on 12/09/2022  Antimicrobials:  Vancomycin IV  Anti-infectives (From admission, onward)    Start     Dose/Rate Route Frequency Ordered Stop    12/10/22 1200  vancomycin (VANCOCIN) IVPB 1000 mg/200 mL premix        1,000 mg 200 mL/hr over 60 Minutes Intravenous Every 12 hours 12/09/22 2149     12/09/22 2345  vancomycin (VANCOREADY) IVPB 1500 mg/300 mL        1,500 mg 150 mL/hr over 120 Minutes Intravenous  Once 12/09/22 2149 12/10/22 0400   12/09/22 2200  vancomycin (VANCOCIN) IVPB 1000 mg/200 mL premix  Status:  Discontinued        1,000 mg 200 mL/hr over 60 Minutes Intravenous  Once 12/09/22 2145 12/09/22 2149      Subjective: Today, patient was seen and examined at bedside.  Complains of pain in the surgical site.  Nursing staff reported blood glucose levels elevated.  Objective: Vitals:   12/09/22 2230 12/09/22 2245 12/10/22 0413 12/10/22 0733  BP: (!) 147/81 (!) 134/91 120/80 136/84  Pulse: 77 83 (!) 103 83  Resp: 15 18 19 16   Temp: 98 F (36.7 C) 98 F (36.7 C) 99.4 F (37.4 C) 98.4 F (36.9 C)  TempSrc:  Oral Oral Oral  SpO2: 98% 98% 99% 97%  Weight:      Height:        Intake/Output Summary (Last 24 hours) at 12/10/2022 0950 Last data filed at 12/10/2022 0626 Gross per 24 hour  Intake 1841.74 ml  Output 10 ml  Net 1831.74 ml   Filed Weights   12/09/22 1518  Weight: 81.6 kg    Physical Examination: Body mass index is 32.92 kg/m.  General: Obese built, not in obvious distress HENT:   No scleral pallor or icterus noted. Oral mucosa is moist.  Chest:  Clear breath sounds.  Diminished  breath sounds bilaterally. No crackles or wheezes.  CVS: S1 &S2 heard. No murmur.  Regular rate and rhythm. Abdomen: Soft, nontender, nondistended.  Bowel sounds are heard.   Extremities: Left BKA.  Right upper extremity with Ace compression bandage. Psych: Alert, awake and oriented, normal mood CNS:  No cranial nerve deficits.  Power equal in all extremities.   Skin: Warm and dry.  No rashes noted.  Data Reviewed:   CBC: Recent Labs  Lab 12/09/22 1529 12/10/22 0538  WBC 6.6 9.1  HGB 17.9* 16.1*  HCT 48.2* 45.0   MCV 95.1 96.4  PLT 251 207    Basic Metabolic Panel: Recent Labs  Lab 12/09/22 1829 12/10/22 0538  NA 135 130*  K 3.9 4.6  CL 103 100  CO2 20* 19*  GLUCOSE 190* 459*  BUN 8 8  CREATININE 0.76 0.83  CALCIUM 9.4 8.3*  MG  --  2.1    Liver Function Tests: No results for input(s): "AST", "ALT", "ALKPHOS", "BILITOT", "PROT", "ALBUMIN" in the last 168 hours.   Radiology Studies: No results found.    LOS: 1 day    Joycelyn Das, MD [facility] Available via Epic secure chat 7am-7pm After these hours, please refer to coverage provider listed on [email or web address] 12/10/2022, 9:50 AM

## 2022-12-10 NOTE — Progress Notes (Signed)
Subjective: 1 Day Post-Op Procedure(s) (LRB): IRRIGATION AND DEBRIDEMENT EXTREMITY (Right) Patient reports pain as mild and moderate.    Objective: Vital signs in last 24 hours: Temp:  [97.9 F (36.6 C)-99.4 F (37.4 C)] 98.4 F (36.9 C) (05/18 0733) Pulse Rate:  [77-116] 83 (05/18 0733) Resp:  [14-27] 16 (05/18 0733) BP: (108-179)/(55-156) 136/84 (05/18 0733) SpO2:  [93 %-99 %] 97 % (05/18 0733) Weight:  [81.6 kg] 81.6 kg (05/17 1518)  Intake/Output from previous day: 05/17 0701 - 05/18 0700 In: 1841.7 [P.O.:600; I.V.:941.7; IV Piggyback:300] Out: 10 [Blood:10] Intake/Output this shift: No intake/output data recorded.  Recent Labs    12/09/22 1529 12/10/22 0538  HGB 17.9* 16.1*   Recent Labs    12/09/22 1529 12/10/22 0538  WBC 6.6 9.1  RBC 5.07 4.67  HCT 48.2* 45.0  PLT 251 207   Recent Labs    12/09/22 1829 12/10/22 0538  NA 135 130*  K 3.9 4.6  CL 103 100  CO2 20* 19*  BUN 8 8  CREATININE 0.76 0.83  GLUCOSE 190* 459*  CALCIUM 9.4 8.3*   No results for input(s): "LABPT", "INR" in the last 72 hours.  Surgical dressing clean and dry.  Fingers still with some swelling.  Sensation intact in median nerve as well as radial and ulnar distributions.  Motor intact in AIN, PIN, and ulnar distributions.  Fingers are warm and well perfused.    Assessment/Plan: 1 Day Post-Op Procedure(s) (LRB): IRRIGATION AND DEBRIDEMENT EXTREMITY (Right)  Plan to continue broad spectrum abx per hospitalist until cultures return.  Recommend elevating right upper extremity to help with swelling and pain.  Okay to resume eliquis POD#1 due to history of multiple blood clots.  To start antibacterial soap soaks around 7am on 5/19, instructions below:    Antibacterial soap soaks  Supplies needed: -Liquid antibacterial soap: Hibiclens or Dial antibacterial soap -clean basin large enough to submerge wound (to be rinsed and dried between uses) -clean dry towel -Sterile telfa  non-adhesive gauze -Sterile gauze -comfortable tape or self adhesive wrap (Coban or ACE)  Perform 2-3 times per day: Place the antibacterial liquid soap in the basin and add warm water and mix thoroughly.  (The water should be sudsy) Soak wound in warm soapy water for 10-15 minutes in clean basin Gently pat dry with clean towel Place Telfa gauze over each wound Cover with fresh sterile gauze and loose ACE wrap   Debra Ball 12/10/2022, 12:19 PM [phone]

## 2022-12-10 NOTE — Progress Notes (Signed)
Patient  transfer from [facility] and oriented,complain of pain and itching to groin,peri care done medicated for itching prior coming to the floor,patient assessment done and made comfortable in room,bed in low position and call  light in reach,family at bedside.

## 2022-12-11 DIAGNOSIS — T8149XA Infection following a procedure, other surgical site, initial encounter: Secondary | ICD-10-CM | POA: Diagnosis not present

## 2022-12-11 DIAGNOSIS — E119 Type 2 diabetes mellitus without complications: Secondary | ICD-10-CM | POA: Diagnosis not present

## 2022-12-11 DIAGNOSIS — I160 Hypertensive urgency: Secondary | ICD-10-CM | POA: Diagnosis not present

## 2022-12-11 DIAGNOSIS — D6859 Other primary thrombophilia: Secondary | ICD-10-CM | POA: Diagnosis not present

## 2022-12-11 LAB — AEROBIC/ANAEROBIC CULTURE W GRAM STAIN (SURGICAL/DEEP WOUND)

## 2022-12-11 LAB — CBC
HCT: 43.9 % (ref 36.0–46.0)
Hemoglobin: 15.2 g/dL — ABNORMAL HIGH (ref 12.0–15.0)
MCH: 34.6 pg — ABNORMAL HIGH (ref 26.0–34.0)
MCHC: 34.6 g/dL (ref 30.0–36.0)
MCV: 100 fL (ref 80.0–100.0)
Platelets: 188 10*3/uL (ref 150–400)
RBC: 4.39 MIL/uL (ref 3.87–5.11)
RDW: 12.8 % (ref 11.5–15.5)
WBC: 5.4 10*3/uL (ref 4.0–10.5)
nRBC: 0 % (ref 0.0–0.2)

## 2022-12-11 LAB — BASIC METABOLIC PANEL
Anion gap: 13 (ref 5–15)
BUN: 10 mg/dL (ref 6–20)
CO2: 20 mmol/L — ABNORMAL LOW (ref 22–32)
Calcium: 8.8 mg/dL — ABNORMAL LOW (ref 8.9–10.3)
Chloride: 97 mmol/L — ABNORMAL LOW (ref 98–111)
Creatinine, Ser: 0.85 mg/dL (ref 0.44–1.00)
GFR, Estimated: >60 mL/min (ref >60)
Glucose, Bld: 572 mg/dL (ref 70–99)
Potassium: 4.2 mmol/L (ref 3.5–5.1)
Sodium: 130 mmol/L — ABNORMAL LOW (ref 135–145)

## 2022-12-11 LAB — GLUCOSE, CAPILLARY
Glucose-Capillary: 555 mg/dL (ref 70–99)
Glucose-Capillary: 268 mg/dL — ABNORMAL HIGH (ref 70–99)
Glucose-Capillary: 306 mg/dL — ABNORMAL HIGH (ref 70–99)
Glucose-Capillary: 241 mg/dL — ABNORMAL HIGH (ref 70–99)
Glucose-Capillary: 487 mg/dL — ABNORMAL HIGH (ref 70–99)
Glucose-Capillary: 459 mg/dL — ABNORMAL HIGH (ref 70–99)
Glucose-Capillary: 370 mg/dL — ABNORMAL HIGH (ref 70–99)
Glucose-Capillary: 309 mg/dL — ABNORMAL HIGH (ref 70–99)

## 2022-12-11 LAB — MAGNESIUM: Magnesium: 2 mg/dL (ref 1.7–2.4)

## 2022-12-11 MED ORDER — INSULIN ASPART 100 UNIT/ML IJ SOLN
15.0000 [IU] | Freq: Once | INTRAMUSCULAR | Status: AC
  Administered 2022-12-11: 15 [IU] via SUBCUTANEOUS

## 2022-12-11 MED ORDER — CHLORHEXIDINE GLUCONATE 4 % EX SOLN
Freq: Two times a day (BID) | CUTANEOUS | Status: DC
  Filled 2022-12-11 (×4): qty 15

## 2022-12-11 MED ORDER — INSULIN GLARGINE-YFGN 100 UNIT/ML ~~LOC~~ SOLN
14.0000 [IU] | Freq: Every day | SUBCUTANEOUS | Status: DC
  Administered 2022-12-11 – 2022-12-12 (×2): 14 [IU] via SUBCUTANEOUS
  Filled 2022-12-11 (×2): qty 0.14

## 2022-12-11 MED ORDER — INSULIN ASPART 100 UNIT/ML IJ SOLN
9.0000 [IU] | Freq: Once | INTRAMUSCULAR | Status: AC
  Administered 2022-12-11: 9 [IU] via SUBCUTANEOUS

## 2022-12-11 NOTE — Progress Notes (Signed)
PROGRESS NOTE    Debra Ball  WGN:[id number] DOB: 1976-04-26 DOA: 12/09/2022 PCP: Kyla Balzarine, PA-C    Brief Narrative:   Debra Ball is a 47 y.o. 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 Kerry Fort and was advised admission to the hospital for cellulitis, antibiotic and further care.  Assessment/Plan    Right palm surgical site infection  Status post right hand irrigation and debridement by orthopedics on 12/09/2022.  Continue analgesia, IV antibiotic with vancomycin.   Been resumed on Eliquis today.  Surgical culture with no organisms so far.  Follow orthopedic recommendation.   Insulin-dependent DM with hyperglycemia. Last known A1c was 10.5% in March 2024.  Continue on insulin regimen. Continue CBG checks and insulin.  Improved after adjusting insulin regimen.   PAD; hypercoagulable state  History of recurrent thromboses, left BKA.  Continue Lipitor.  Has been started on Eliquis 12/10/2022   Hypertensive urgency  Improved with pain control.  Continue losartan, metoprolol and hydralazine.       DVT prophylaxis:  apixaban (ELIQUIS) tablet 5 mg   Code Status:     Code Status: Full Code  Disposition: Home  Status is: Inpatient  Remains inpatient appropriate because: IV antibiotic, orthopedic follow-up, hyperglycemia   Family Communication: None at bedside  Consultants:  Orthopedics  Procedures:  Right hand irrigation and debridement on 12/09/2022  Antimicrobials:  Vancomycin IV  Anti-infectives (From admission, onward)    Start     Dose/Rate Route Frequency Ordered Stop    12/10/22 1200  vancomycin (VANCOCIN) IVPB 1000 mg/200 mL premix        1,000 mg 200 mL/hr over 60 Minutes Intravenous Every 12 hours 12/09/22 2149     12/09/22 2345  vancomycin (VANCOREADY) IVPB 1500 mg/300 mL        1,500 mg 150 mL/hr over 120 Minutes Intravenous  Once 12/09/22 2149 12/10/22 0400   12/09/22 2200  vancomycin (VANCOCIN) IVPB 1000 mg/200 mL premix  Status:  Discontinued        1,000 mg 200 mL/hr over 60 Minutes Intravenous  Once 12/09/22 2145 12/09/22 2149      Subjective: Today, patient was seen and examined at bedside.  Denies any nausea vomiting fever chills or rigor.  Complains of pain at the surgical site.    Objective: Vitals:   12/10/22 1545 12/10/22 2014 12/11/22 0324 12/11/22 0833  BP: 121/72 111/69 (!) 140/81 105/77  Pulse: 88 87 77 89  Resp: 16 16 17 17   Temp: 99.1 F (37.3 C) 98.3 F (36.8 C) 98 F (36.7 C) 98 F (36.7 C)  TempSrc: Oral Oral Oral   SpO2: 97% 98% 93% 99%  Weight:      Height:        Intake/Output Summary (Last 24 hours) at 12/11/2022 1103 Last data filed at 12/11/2022 0523 Gross per 24 hour  Intake 1484.42 ml  Output --  Net 1484.42 ml    Filed Weights   12/09/22 1518  Weight: 81.6 kg    Physical Examination: Body mass index is 32.92 kg/m.   General: Obese built, not in obvious distress alert awake and oriented. HENT:   No scleral pallor or icterus noted. Oral mucosa is moist.  Chest:  Clear breath sounds.   No crackles or wheezes.  CVS: S1 &S2 heard. No murmur.  Regular rate and rhythm. Abdomen: Soft, nontender, nondistended.  Bowel sounds are heard.   Extremities: Left below-knee amputation, right upper extremity with Ace compression bandage. Psych: Alert, awake and oriented, normal mood CNS:  No cranial nerve deficits.  Moves all extremities. Skin: Warm and dry.  No rashes noted.  Data Reviewed:   CBC: Recent Labs  Lab 12/09/22 1529 12/10/22 0538 12/11/22 0035  WBC 6.6 9.1 5.4  HGB 17.9* 16.1* 15.2*   HCT 48.2* 45.0 43.9  MCV 95.1 96.4 100.0  PLT 251 207 188     Basic Metabolic Panel: Recent Labs  Lab 12/09/22 1829 12/10/22 0538 12/11/22 0035  NA 135 130* 130*  K 3.9 4.6 4.2  CL 103 100 97*  CO2 20* 19* 20*  GLUCOSE 190* 459* 572*  BUN 8 8 10   CREATININE 0.76 0.83 0.85  CALCIUM 9.4 8.3* 8.8*  MG  --  2.1 2.0     Liver Function Tests: No results for input(s): "AST", "ALT", "ALKPHOS", "BILITOT", "PROT", "ALBUMIN" in the last 168 hours.   Radiology Studies: No results found.    LOS: 2 days    Joycelyn Das, MD [facility] Available via Epic secure chat 7am-7pm After these hours, please refer to coverage provider listed on [email or web address] 12/11/2022, 11:03 AM

## 2022-12-11 NOTE — Progress Notes (Signed)
Notified on call provider of critical lab CBG [id number], received order to give as additional 9 units of insulin and recheck in 2 hours .

## 2022-12-12 DIAGNOSIS — D6859 Other primary thrombophilia: Secondary | ICD-10-CM | POA: Diagnosis not present

## 2022-12-12 DIAGNOSIS — T8149XA Infection following a procedure, other surgical site, initial encounter: Secondary | ICD-10-CM | POA: Diagnosis not present

## 2022-12-12 DIAGNOSIS — I739 Peripheral vascular disease, unspecified: Secondary | ICD-10-CM | POA: Diagnosis not present

## 2022-12-12 DIAGNOSIS — I160 Hypertensive urgency: Secondary | ICD-10-CM | POA: Diagnosis not present

## 2022-12-12 LAB — AEROBIC/ANAEROBIC CULTURE W GRAM STAIN (SURGICAL/DEEP WOUND)

## 2022-12-12 LAB — GLUCOSE, CAPILLARY: Glucose-Capillary: 298 mg/dL — ABNORMAL HIGH (ref 70–99)

## 2022-12-12 MED ORDER — CEPHALEXIN 500 MG PO CAPS: 500.0000 mg | ORAL_CAPSULE | Freq: Three times a day (TID) | ORAL | 0 refills | Status: AC

## 2022-12-12 MED ORDER — TRAMADOL HCL 50 MG PO TABS: 50.0000 mg | ORAL_TABLET | Freq: Four times a day (QID) | ORAL | 0 refills | Status: AC | PRN

## 2022-12-12 MED ORDER — CHLORHEXIDINE GLUCONATE 4 % EX SOLN
Freq: Three times a day (TID) | CUTANEOUS | Status: DC
  Filled 2022-12-12: qty 15

## 2022-12-12 NOTE — Progress Notes (Signed)
RN performed Hibiclens soak and dressing change. Patient educated on importance of soaking and dressing changes.   RN to perform dressing changes TID.

## 2022-12-12 NOTE — Discharge Summary (Signed)
Physician Discharge Summary  Debra Ball ZOX:[id number] DOB: 07/10/1976 DOA: 12/09/2022  PCP: Kyla Balzarine, PA-C  Admit date: 12/09/2022 Discharge date: 12/12/2022  Admitted From: Home  Discharge disposition: Home   Recommendations for Outpatient Follow-Up:   Follow up with your primary care provider in one week.  Check CBC, BMP, magnesium in the next visit Follow-up with your Dr Larita Fife on Monday orthopedics in 1 week.  Office to schedule postoperative follow-up.  Discharge Diagnosis:   Principal Problem:   Surgical site infection Active Problems:   Insulin dependent type 2 diabetes mellitus (HCC)   PAD (peripheral artery disease) (HCC)   Hypertensive urgency   Hypercoagulable state (HCC)  Discharge Condition: Improved.  Diet recommendation: Low sodium, heart healthy.  Carbohydrate-modified.   Wound care: Local hand wound care  Code status: Full.   History of Present Illness:   Debra Ball is a 47 y.o. 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 Kerry Fort and was advised admission to the hospital for cellulitis, antibiotic and further care.   Hospital Course:   Following conditions were addressed during hospitalization as listed below,  Right palm surgical site infection  Status post right hand irrigation and debridement by orthopedics on 12/09/2022.  Postoperatively patient was continued on IV antibiotics and Eliquis.  Orthopedics has followed the patient today and patient is stable for disposition home with outpatient orthopedic follow-up in 1 week.  Surgical culture  with methicillin sensitive Staph aureus.  Will continue Keflex for 3 more weeks as per orthopedic recommendation.    Insulin-dependent DM with hyperglycemia. Last known A1c was 10.5% in March 2024.  Continue insulin regimen from home on discharge.  Patient is on Lantus 10 units and Jardiance   PAD; hypercoagulable state  History of recurrent thromboses, left BKA.  Continue Lipitor.  Has been started on Eliquis 12/10/2022, will continue Eliquis on discharge.   Essential hypertension Continue losartan, metoprolol and hydralazine.  Disposition.  At this time, patient is stable for disposition home with outpatient PCP and orthopedic follow-up.  Medical Consultants:   Orthopedics  Procedures:    Right hand irrigation and debridement by orthopedics on 12/09/2022 Subjective:   Today, patient was seen and examined at bedside.  Wishes to go home.  Denies any pain, nausea, vomiting, fever, chills or rigor.  Discharge Exam:   Vitals:   12/12/22 0433 12/12/22 0827  BP: 114/72 (!) 105/56  Pulse: 74 76  Resp: 18 18  Temp: 98 F (36.7 C) 98.1 F (36.7 C)  SpO2: 97% 100%   Vitals:   12/11/22 1610 12/11/22 2009 12/12/22 0433 12/12/22 0827  BP: (!) 143/77 91/75 114/72 (!) 105/56  Pulse: 83 99 74 76  Resp: 16 18 18 18   Temp: 98.9 F (37.2 C) 98.9 F (37.2 C) 98 F (36.7 C) 98.1 F (36.7 C)  TempSrc: Oral Oral Oral Oral  SpO2: 99% 100% 97% 100%  Weight:      Height:       Body mass index is 32.92 kg/m.  General: Alert awake, not in obvious distress, obese built HENT: pupils equally reacting to light,  No scleral pallor or icterus noted. Oral mucosa is moist.  Chest:  Clear breath sounds.  Diminished breath sounds bilaterally. No crackles or wheezes.  CVS: S1 &S2 heard. No murmur.  Regular rate and rhythm. Abdomen: Soft, nontender, nondistended.  Bowel sounds are heard.   Extremities: No cyanosis, clubbing or edema.  Peripheral pulses are palpable.  Left below-knee amputation,  right upper extremity with Ace compression bandage Psych: Alert, awake and oriented, normal mood CNS:  No cranial nerve deficits.  Left below-knee amputation. Skin: Warm and dry.  Right hand with compression bandage.  The results of significant diagnostics from this hospitalization (including imaging, microbiology, ancillary and laboratory) are listed below for reference.     Diagnostic Studies:   No results found.   Labs:   Basic Metabolic Panel: Recent Labs  Lab 12/09/22 1829 12/10/22 0538 12/11/22 0035  NA 135 130* 130*  K 3.9 4.6 4.2  CL 103 100 97*  CO2 20* 19* 20*  GLUCOSE 190* 459* 572*  BUN 8 8 10   CREATININE 0.76 0.83 0.85  CALCIUM 9.4 8.3* 8.8*  MG  --  2.1 2.0   GFR Estimated Creatinine Clearance: 83.6 mL/min (by C-G formula based on SCr of 0.85 mg/dL). Liver Function Tests: No results for input(s): "AST", "ALT", "ALKPHOS", "BILITOT", "PROT", "ALBUMIN" in the last 168 hours. No results for input(s): "LIPASE", "AMYLASE" in the last 168 hours. No results for input(s): "AMMONIA" in the last 168 hours. Coagulation profile No results for input(s): "INR", "PROTIME" in the last 168 hours.  CBC: Recent Labs  Lab 12/09/22 1529 12/10/22 0538 12/11/22 0035  WBC 6.6 9.1 5.4  HGB 17.9* 16.1* 15.2*  HCT 48.2* 45.0 43.9  MCV 95.1 96.4 100.0  PLT 251 207 188   Cardiac Enzymes: No results for input(s): "CKTOTAL", "CKMB", "CKMBINDEX", "TROPONINI" in the last 168 hours. BNP: Invalid input(s): "POCBNP" CBG: Recent Labs  Lab 12/11/22 1610 12/11/22 1722 12/11/22 1824 12/11/22 2010 12/12/22 0824  GLUCAP 487* 459* 370* 309* 298*   D-Dimer No results for input(s): "DDIMER" in the last 72 hours. Hgb A1c Recent Labs    12/10/22 0545  HGBA1C 12.3*   Lipid Profile No results for input(s): "CHOL", "HDL", "LDLCALC", "TRIG", "CHOLHDL", "LDLDIRECT" in the last 72 hours. Thyroid function studies No results for input(s): "TSH", "T4TOTAL", "T3FREE", "THYROIDAB" in  the last 72 hours.  Invalid input(s): "FREET3" Anemia work up No results for input(s): "VITAMINB12", "FOLATE", "FERRITIN", "TIBC", "IRON", "RETICCTPCT" in the last 72 hours. Microbiology Recent Results (from the past 240 hour(s))  Aerobic/Anaerobic Culture w Gram Stain (surgical/deep wound)     Status: None (Preliminary result)   Collection Time: 12/09/22  7:50 PM   Specimen: Hand, Right; Tissue  Result Value Ref Range Status   Specimen Description WOUND  Final   Special Requests RIGHT PALM, SPEC A  Final   Gram Stain   Final    RARE WBC PRESENT, PREDOMINANTLY PMN NO ORGANISMS SEEN Performed at [facility], [address], [place], [place] [address]    Culture   Final    RARE STAPHYLOCOCCUS AUREUS SUSCEPTIBILITIES PERFORMED ON PREVIOUS CULTURE WITHIN THE LAST 5 DAYS. NO ANAEROBES ISOLATED; CULTURE IN PROGRESS FOR 5 DAYS    Report Status PENDING  Incomplete  Aerobic/Anaerobic Culture w Gram Stain (surgical/deep wound)     Status: None (Preliminary result)   Collection Time: 12/09/22  7:55 PM   Specimen: Hand, Right; Tissue  Result Value Ref Range Status   Specimen Description WOUND  Final   Special Requests RIGHT PALM, SPEC B  Final   Gram Stain  Final    FEW WBC PRESENT, PREDOMINANTLY PMN FEW GRAM POSITIVE COCCI Performed at [facility], [address], [place], [place] [address]    Culture   Final    MODERATE STAPHYLOCOCCUS AUREUS RARE GRAM NEGATIVE RODS IDENTIFICATION AND SUSCEPTIBILITIES TO FOLLOW NO ANAEROBES ISOLATED; CULTURE IN PROGRESS FOR 5 DAYS    Report Status PENDING  Incomplete   Organism ID, Bacteria STAPHYLOCOCCUS AUREUS  Final      Susceptibility   Staphylococcus aureus - MIC*    CIPROFLOXACIN <=0.5 SENSITIVE Sensitive     ERYTHROMYCIN <=0.25 SENSITIVE Sensitive     GENTAMICIN <=0.5 SENSITIVE Sensitive     OXACILLIN 0.5 SENSITIVE Sensitive     TETRACYCLINE <=1 SENSITIVE Sensitive     VANCOMYCIN <=0.5 SENSITIVE Sensitive      TRIMETH/SULFA <=10 SENSITIVE Sensitive     CLINDAMYCIN <=0.25 SENSITIVE Sensitive     RIFAMPIN <=0.5 SENSITIVE Sensitive     Inducible Clindamycin NEGATIVE Sensitive     LINEZOLID 2 SENSITIVE Sensitive     * MODERATE STAPHYLOCOCCUS AUREUS  Aerobic/Anaerobic Culture w Gram Stain (surgical/deep wound)     Status: None (Preliminary result)   Collection Time: 12/09/22  7:58 PM   Specimen: Hand, Right; Tissue  Result Value Ref Range Status   Specimen Description TISSUE  Final   Special Requests RIGHT PALM, SPEC C  Final   Gram Stain   Final    NO WBC SEEN NO ORGANISMS SEEN Performed at [facility], [address] [place], [place] [address]    Culture   Final    RARE STAPHYLOCOCCUS AUREUS NO ANAEROBES ISOLATED; CULTURE IN PROGRESS FOR 5 DAYS    Report Status PENDING  Incomplete   Organism ID, Bacteria STAPHYLOCOCCUS AUREUS  Final      Susceptibility   Staphylococcus aureus - MIC*    CIPROFLOXACIN <=0.5 SENSITIVE Sensitive     ERYTHROMYCIN <=0.25 SENSITIVE Sensitive     GENTAMICIN <=0.5 SENSITIVE Sensitive     OXACILLIN 0.5 SENSITIVE Sensitive     TETRACYCLINE <=1 SENSITIVE Sensitive     VANCOMYCIN 1 SENSITIVE Sensitive     TRIMETH/SULFA <=10 SENSITIVE Sensitive     CLINDAMYCIN <=0.25 SENSITIVE Sensitive     RIFAMPIN <=0.5 SENSITIVE Sensitive     Inducible Clindamycin NEGATIVE Sensitive     LINEZOLID 2 SENSITIVE Sensitive     * RARE STAPHYLOCOCCUS AUREUS     Discharge Instructions:   Discharge Instructions     Call MD for:  severe uncontrolled pain   Complete by: As directed    Call MD for:  temperature >100.4   Complete by: As directed    Diet - low sodium heart healthy   Complete by: As directed    Discharge instructions   Complete by: As directed    Follow-up with Dr Larita Fife, orthopedics in 1 week.  Continue dressing as advised.  Follow-up with your primary care physician in 1 week.  Seek medical attention for worsening symptoms.  Complete course of  antibiotic.   Discharge wound care:   Complete by: As directed    Hibiclens soak and dressing changes TID.   Increase activity slowly   Complete by: As directed       Allergies as of 12/12/2022       Reactions   Hydromorphone Itching   Oxycodone-acetaminophen Other (See Comments)   Caused seizure-type reactions while taking medication   Hydrocodone Bitartrate Er Hives        Medication List  TAKE these medications    acetaminophen 500 MG tablet Commonly known as: TYLENOL Take 500-1,000 mg by mouth every 6 (six) hours as needed for mild pain (or headaches).   acetaminophen 325 MG tablet Commonly known as: TYLENOL Take 1-2 tablets (325-650 mg total) by mouth every 4 (four) hours as needed for mild pain (or temp >/= 101 F).   albuterol 108 (90 Base) MCG/ACT inhaler Commonly known as: VENTOLIN HFA Inhale 2 puffs into the lungs every 4 (four) hours as needed for wheezing or shortness of breath.   ALPRAZolam 0.25 MG tablet Commonly known as: XANAX Take 1-2 tablets (0.25-0.5 mg total) by mouth 3 (three) times daily as needed for anxiety.   atorvastatin 80 MG tablet Commonly known as: LIPITOR Take 80 mg by mouth daily.   cephALEXin 500 MG capsule Commonly known as: KEFLEX Take 1 capsule (500 mg total) by mouth 3 (three) times daily for 21 days.   Curity Iodoform Packing Strip Misc 1 strip by Does not apply route daily.   docusate sodium 100 MG capsule Commonly known as: COLACE Take 1 capsule (100 mg total) by mouth daily.   Eliquis 5 MG Tabs tablet Generic drug: apixaban Take 5 mg by mouth 2 (two) times daily.   gabapentin 100 MG capsule Commonly known as: NEURONTIN Take 100 mg by mouth 3 (three) times daily.   Jardiance 25 MG Tabs tablet Generic drug: empagliflozin Take 25 mg by mouth daily.   Lantus SoloStar 100 UNIT/ML Solostar Pen Generic drug: insulin glargine Inject 10 Units into the skin in the morning.   losartan 25 MG tablet Commonly known  as: COZAAR Take 25 mg by mouth daily.   methocarbamol 500 MG tablet Commonly known as: ROBAXIN Take 1 tablet (500 mg total) by mouth every 8 (eight) hours as needed for muscle spasms.   pantoprazole 40 MG tablet Commonly known as: PROTONIX Take 1 tablet (40 mg total) by mouth daily.   pregabalin 100 MG capsule Commonly known as: LYRICA TAKE 1 CAPSULE BY MOUTH THREE TIMES DAILY   Rybelsus 7 MG Tabs Generic drug: Semaglutide Take 7 mg by mouth daily.   traMADol 50 MG tablet Commonly known as: ULTRAM Take 1 tablet (50 mg total) by mouth every 6 (six) hours as needed for moderate pain.   Vitamin D (Ergocalciferol) 1.25 MG (50000 UNIT) Caps capsule Commonly known as: DRISDOL Take 50,000 Units by mouth every Monday.               Discharge Care Instructions  (From admission, onward)           Start     Ordered   12/12/22 0000  Discharge wound care:       Comments: Hibiclens soak and dressing changes TID.   12/12/22 1017            Follow-up Information     Morehart, Lennox Laity, PA-C Follow up in 1 week(s).   Specialty: Family Medicine Contact information: [address] [place] [address] [phone]         Ramon Dredge, MD Follow up in 1 week(s).   Specialties: Orthopedic Surgery, Hand Surgery Why: office to schedule appointment Contact information: [address] [place] [address] [phone]                  Time coordinating discharge: 39 minutes  Signed:  Calahan Pak  [facility] 12/12/2022, 11:48 AM

## 2022-12-12 NOTE — Discharge Instructions (Signed)
[facility] Post-Operative Instructions    General -These instructions are to compliment information given to you by your surgeon. -You may resume your normal diet as tolerated.  -You may resume your normal medications unless specifically instructed to stop taking a certain medication. -If you are not sure about restarting one of your medications after surgery, please contact the office during normal business hours and we will be able to assist you.   Post-Operative Dressings Antibacterial soap soaks- Perform 3 times a day for 15 minutes each time.  Supplies needed: -Liquid antibacterial soap: Hibiclens or Dial antibacterial soap -clean basin large enough to submerge wound (to be rinsed and dried between uses) -clean dry towel -Sterile "telfa" non-adhesive gauze -Sterile gauze -comfortable tape or self adhesive wrap (Coban or ACE)  Perform  times per day: Place the antibacterial liquid soap in the basin and add warm water and mix thoroughly.  (The water should be sudsy) Soak wound in warm soapy water for 15 minutes in clean basin Gently pat dry with clean towel Place Telfa gauze over each wound Cover with fresh sterile gauze and loose ACE wrap  If you have questions about your dressings, please call the clinic.   Post-operative Wound Care In general:  Any sutures or anything that will not fall off over time on their own, will be removed in clinic by our staff.  -If your surgical wound/incision was closed with non-absorbable sutures or staples, they will be removed at your first post-op visit.   Regardless of any sutures, stickers or glue used to close your wound, do not submerge the wound in any standing water for at least 4 weeks after surgery.  It is okay to let the shower water run over the wound and gently clean the wound with soapy water then rinse and gently pat dry.   Weightbearing/Activity  Do not use your operative  extremity for any lifting or weight bearing  Please start finger range of motion exercises right away.  If you have a splint in place, you should move all joints that are not immobilized by a splint.   Other pain relieving options:  Elevation: Elevating your operative extremity can be very helpful to reduce this pain. Prop your arm up on pillows to keep the operative site above the level of your heart. If you develop tingling from prolonged elevation, take a break from elevating and this should resolve.  Icing: If you do not have a splint/cast, using a cold pack to ice the affected area a few times a day (15 to 20 minutes at a time) can also help to relieve pain, reduce swelling and bruising.   If you can take nonsteroidal anti-inflammatory medications (NSAIDs, for example: Ibuprofen, Advil, Motrin, Aleve, etc.), you may take them to help control your pain. If you are unsure whether you can take anti-inflammatory medications, please check with your primary care provider.  If you are already taking a prescription of anti-inflammatory medications such as Celebrex (celecoxib) or Mobic (meloxicam) you should not take any additional anti-inflammatory drugs such as Ibuprofen, Advil, Motrin, or Aleve.    Follow Up Please call [facility] at [phone] if you do not receive or are unsure of your first follow-up appointment.  You should see your surgeon or PA 10-16 days after your surgery unless otherwise instructed.     Please call the office for any problems, including the following:  - Excessive redness of the  incisions - Drainage for more than 4 days - Fever of more than 101.5 F - Nausea/vomiting that does not stop  - Numbness, tingling, or discoloration of extremity  - Unable to drink fluids  - Uncontrollable pain     Shaune Pollack, MD Hand and Upper Extremity Surgery [facility] [place] [phone]

## 2022-12-12 NOTE — Progress Notes (Signed)
Subjective: 3 Days Post-Op Procedure(s) (LRB): IRRIGATION AND DEBRIDEMENT EXTREMITY (Right) Patient reports pain as well controlled.  MSSA on intra-op cultures  Objective: Vital signs in last 24 hours: Temp:  [98 F (36.7 C)-98.9 F (37.2 C)] 98.1 F (36.7 C) (05/20 0827) Pulse Rate:  [74-99] 76 (05/20 0827) Resp:  [16-18] 18 (05/20 0827) BP: (91-143)/(56-77) 105/56 (05/20 0827) SpO2:  [97 %-100 %] 100 % (05/20 0827)  Intake/Output from previous day: 05/19 0701 - 05/20 0700 In: 260 [P.O.:260] Out: -  Intake/Output this shift: No intake/output data recorded.  Recent Labs    12/09/22 1529 12/10/22 0538 12/11/22 0035  HGB 17.9* 16.1* 15.2*   Recent Labs    12/10/22 0538 12/11/22 0035  WBC 9.1 5.4  RBC 4.67 4.39  HCT 45.0 43.9  PLT 207 188   Recent Labs    12/10/22 0538 12/11/22 0035  NA 130* 130*  K 4.6 4.2  CL 100 97*  CO2 19* 20*  BUN 8 10  CREATININE 0.83 0.85  GLUCOSE 459* 572*  CALCIUM 8.3* 8.8*   No results for input(s): "LABPT", "INR" in the last 72 hours.  Neurovascular intact  Assessment/Plan: 3 Days Post-Op Procedure(s) (LRB): IRRIGATION AND DEBRIDEMENT EXTREMITY (Right) Okay to dc on oral abx for 3 weeks and TID dial soap soaks.  Debra Ball 12/12/2022, 9:57 AM

## 2022-12-12 NOTE — Progress Notes (Signed)
Patient provided discharge instructions, including dressing change education and supplies to last a week.

## 2022-12-12 NOTE — Progress Notes (Signed)
Pharmacy Antibiotic Note- follow-up  Debra Ball is a 47 y.o. female admitted on 12/09/2022 with right carpal tunnel release approximately 2 weeks ago now with right palm surgical site infection s/p I&D 5/7.  Pharmacy has been consulted for vancomycin dosing.  5/17 Vancomycin 1000mg  Q 12 hr Scr used: 0.8 mg/dL Weight: 16.1 kg Vd coeff: 0.5 L/kg Est AUC: 440  Plan: Continue Vancomycin 1000mg  q12 hr Monitor cultures, clinical status, renal function, vancomycin level Narrow abx as able and f/u duration     Height: 5\' 2"  (157.5 cm) Weight: 81.6 kg (180 lb) IBW/kg (Calculated) : 50.1  Temp (24hrs), Avg:98.5 F (36.9 C), Min:98 F (36.7 C), Max:98.9 F (37.2 C)  Recent Labs  Lab 12/09/22 1529 12/09/22 1829 12/10/22 0538 12/11/22 0035  WBC 6.6  --  9.1 5.4  CREATININE  --  0.76 0.83 0.85     Estimated Creatinine Clearance: 83.6 mL/min (by C-G formula based on SCr of 0.85 mg/dL).    Allergies  Allergen Reactions   Hydromorphone Itching   Oxycodone-Acetaminophen Other (See Comments)    Caused seizure-type reactions while taking medication   Hydrocodone Bitartrate Er Hives    Antimicrobials this admission: vancomycin 5/17 >>  Cefaz 5/17   Dose adjustments this admission: N/a  Microbiology results: 5/17 BCx:   5/17 OR culture: moderate staph aureus  Thank you for allowing pharmacy to be a part of this patient's care.  Debra Ball BS, PharmD, BCPS Clinical Pharmacist 12/12/2022 8:44 AM  Contact: [phone] after 3 PM  "Be curious, not judgmental..." -[name]

## 2022-12-14 LAB — AEROBIC/ANAEROBIC CULTURE W GRAM STAIN (SURGICAL/DEEP WOUND): Gram Stain: NONE SEEN
